# Patient Record
Sex: Male | Born: 1975 | Hispanic: Yes | Marital: Single | Smoking: Former smoker
Health system: Southern US, Community
[De-identification: ages and names within clinical notes are randomized; demographics above are authoritative.]

## PROBLEM LIST (undated history)

## (undated) DIAGNOSIS — R519 Headache, unspecified: Secondary | ICD-10-CM

## (undated) DIAGNOSIS — K275 Chronic or unspecified peptic ulcer, site unspecified, with perforation: Secondary | ICD-10-CM

## (undated) DIAGNOSIS — R51 Headache: Secondary | ICD-10-CM

## (undated) DIAGNOSIS — I10 Essential (primary) hypertension: Secondary | ICD-10-CM

## (undated) DIAGNOSIS — I8500 Esophageal varices without bleeding: Secondary | ICD-10-CM

## (undated) DIAGNOSIS — K759 Inflammatory liver disease, unspecified: Secondary | ICD-10-CM

## (undated) DIAGNOSIS — K7460 Unspecified cirrhosis of liver: Secondary | ICD-10-CM

## (undated) DIAGNOSIS — F1010 Alcohol abuse, uncomplicated: Secondary | ICD-10-CM

## (undated) DIAGNOSIS — K2980 Duodenitis without bleeding: Secondary | ICD-10-CM

## (undated) HISTORY — PX: REPAIR OF PERFORATED ULCER: SHX6065

---

## 2011-03-27 ENCOUNTER — Inpatient Hospital Stay (HOSPITAL_COMMUNITY): Payer: Medicaid Other

## 2011-03-27 ENCOUNTER — Other Ambulatory Visit (HOSPITAL_COMMUNITY): Payer: Self-pay

## 2011-03-27 ENCOUNTER — Inpatient Hospital Stay (HOSPITAL_COMMUNITY)
Admission: AD | Admit: 2011-03-27 | Discharge: 2011-04-03 | DRG: 871 | Disposition: A | Discharge status: Home Health | Payer: Medicaid Other | Source: Other Acute Inpatient Hospital | Attending: Internal Medicine | Admitting: Internal Medicine

## 2011-03-27 DIAGNOSIS — K838 Other specified diseases of biliary tract: Secondary | ICD-10-CM | POA: Diagnosis present

## 2011-03-27 DIAGNOSIS — N170 Acute kidney failure with tubular necrosis: Secondary | ICD-10-CM

## 2011-03-27 DIAGNOSIS — Z79899 Other long term (current) drug therapy: Secondary | ICD-10-CM

## 2011-03-27 DIAGNOSIS — A419 Sepsis, unspecified organism: Secondary | ICD-10-CM

## 2011-03-27 DIAGNOSIS — E872 Acidosis, unspecified: Secondary | ICD-10-CM | POA: Diagnosis present

## 2011-03-27 DIAGNOSIS — A4151 Sepsis due to Escherichia coli [E. coli]: Principal | ICD-10-CM | POA: Diagnosis present

## 2011-03-27 DIAGNOSIS — E876 Hypokalemia: Secondary | ICD-10-CM | POA: Diagnosis present

## 2011-03-27 DIAGNOSIS — D649 Anemia, unspecified: Secondary | ICD-10-CM | POA: Diagnosis not present

## 2011-03-27 DIAGNOSIS — Z23 Encounter for immunization: Secondary | ICD-10-CM

## 2011-03-27 DIAGNOSIS — N179 Acute kidney failure, unspecified: Secondary | ICD-10-CM | POA: Diagnosis present

## 2011-03-27 DIAGNOSIS — J9819 Other pulmonary collapse: Secondary | ICD-10-CM | POA: Diagnosis present

## 2011-03-27 DIAGNOSIS — N12 Tubulo-interstitial nephritis, not specified as acute or chronic: Secondary | ICD-10-CM | POA: Diagnosis present

## 2011-03-27 DIAGNOSIS — K7030 Alcoholic cirrhosis of liver without ascites: Secondary | ICD-10-CM

## 2011-03-27 DIAGNOSIS — I10 Essential (primary) hypertension: Secondary | ICD-10-CM | POA: Diagnosis present

## 2011-03-27 DIAGNOSIS — J189 Pneumonia, unspecified organism: Secondary | ICD-10-CM | POA: Diagnosis present

## 2011-03-27 DIAGNOSIS — D689 Coagulation defect, unspecified: Secondary | ICD-10-CM | POA: Diagnosis present

## 2011-03-27 DIAGNOSIS — R161 Splenomegaly, not elsewhere classified: Secondary | ICD-10-CM | POA: Diagnosis present

## 2011-03-27 DIAGNOSIS — F1010 Alcohol abuse, uncomplicated: Secondary | ICD-10-CM | POA: Diagnosis present

## 2011-03-27 LAB — URINALYSIS, ROUTINE W REFLEX MICROSCOPIC
Nitrite: POSITIVE — AB
Protein, ur: 30 mg/dL — AB
Specific Gravity, Urine: 1.011 (ref 1.005–1.030)
Urobilinogen, UA: 1 mg/dL (ref 0.0–1.0)

## 2011-03-27 LAB — BASIC METABOLIC PANEL
CO2: 18 mEq/L — ABNORMAL LOW (ref 19–32)
Chloride: 94 mEq/L — ABNORMAL LOW (ref 96–112)
Sodium: 129 mEq/L — ABNORMAL LOW (ref 135–145)

## 2011-03-27 LAB — CARDIAC PANEL(CRET KIN+CKTOT+MB+TROPI): Total CK: 21 U/L (ref 7–232)

## 2011-03-27 LAB — CBC
Hemoglobin: 13 g/dL (ref 13.0–17.0)
MCV: 86.3 fL (ref 78.0–100.0)
Platelets: 120 10*3/uL — ABNORMAL LOW (ref 150–400)
RBC: 4.16 MIL/uL — ABNORMAL LOW (ref 4.22–5.81)
WBC: 20.2 10*3/uL — ABNORMAL HIGH (ref 4.0–10.5)

## 2011-03-27 LAB — HEPATIC FUNCTION PANEL
AST: 70 U/L — ABNORMAL HIGH (ref 0–37)
Albumin: 2.4 g/dL — ABNORMAL LOW (ref 3.5–5.2)
Alkaline Phosphatase: 183 U/L — ABNORMAL HIGH (ref 39–117)
Total Protein: 7.4 g/dL (ref 6.0–8.3)

## 2011-03-27 LAB — LIPASE, BLOOD: Lipase: 60 U/L — ABNORMAL HIGH (ref 11–59)

## 2011-03-27 LAB — GLUCOSE, CAPILLARY: Glucose-Capillary: 95 mg/dL (ref 70–99)

## 2011-03-27 LAB — AMYLASE: Amylase: 55 U/L (ref 0–105)

## 2011-03-27 LAB — MRSA PCR SCREENING: MRSA by PCR: NEGATIVE

## 2011-03-27 LAB — PROTIME-INR: Prothrombin Time: 19.8 seconds — ABNORMAL HIGH (ref 11.6–15.2)

## 2011-03-27 LAB — MAGNESIUM: Magnesium: 3.1 mg/dL — ABNORMAL HIGH (ref 1.5–2.5)

## 2011-03-27 LAB — LACTATE DEHYDROGENASE: LDH: 187 U/L (ref 94–250)

## 2011-03-27 LAB — PHOSPHORUS: Phosphorus: 5.3 mg/dL — ABNORMAL HIGH (ref 2.3–4.6)

## 2011-03-27 LAB — STREP PNEUMONIAE URINARY ANTIGEN: Strep Pneumo Urinary Antigen: NEGATIVE

## 2011-03-27 LAB — URINE MICROSCOPIC-ADD ON

## 2011-03-28 ENCOUNTER — Inpatient Hospital Stay (HOSPITAL_COMMUNITY): Payer: Medicaid Other

## 2011-03-28 LAB — LACTIC ACID, PLASMA: Lactic Acid, Venous: 0.9 mmol/L (ref 0.5–2.2)

## 2011-03-28 LAB — CBC
HCT: 32.9 % — ABNORMAL LOW (ref 39.0–52.0)
Hemoglobin: 12.1 g/dL — ABNORMAL LOW (ref 13.0–17.0)
MCH: 31.8 pg (ref 26.0–34.0)
MCV: 86.4 fL (ref 78.0–100.0)
RBC: 3.81 MIL/uL — ABNORMAL LOW (ref 4.22–5.81)

## 2011-03-28 LAB — ANTISTREPTOLYSIN O TITER: ASO: 39 IU/mL (ref 0–408)

## 2011-03-28 LAB — COMPREHENSIVE METABOLIC PANEL
ALT: 39 U/L (ref 0–53)
CO2: 19 mEq/L (ref 19–32)
Calcium: 9 mg/dL (ref 8.4–10.5)
Creatinine, Ser: 6.87 mg/dL — ABNORMAL HIGH (ref 0.50–1.35)
GFR calc Af Amer: 11 mL/min — ABNORMAL LOW (ref >60)
GFR calc non Af Amer: 9 mL/min — ABNORMAL LOW (ref >60)
Glucose, Bld: 108 mg/dL — ABNORMAL HIGH (ref 70–99)
Sodium: 129 mEq/L — ABNORMAL LOW (ref 135–145)

## 2011-03-29 ENCOUNTER — Inpatient Hospital Stay (HOSPITAL_COMMUNITY): Payer: Medicaid Other

## 2011-03-29 LAB — URINE CULTURE
Culture  Setup Time: 201209201800
Special Requests: NEGATIVE

## 2011-03-29 LAB — CBC
MCHC: 36.5 g/dL — ABNORMAL HIGH (ref 30.0–36.0)
MCV: 84.7 fL (ref 78.0–100.0)
Platelets: 136 10*3/uL — ABNORMAL LOW (ref 150–400)
RDW: 14.4 % (ref 11.5–15.5)
WBC: 14 10*3/uL — ABNORMAL HIGH (ref 4.0–10.5)

## 2011-03-29 LAB — COMPREHENSIVE METABOLIC PANEL
AST: 60 U/L — ABNORMAL HIGH (ref 0–37)
Albumin: 1.8 g/dL — ABNORMAL LOW (ref 3.5–5.2)
Chloride: 92 mEq/L — ABNORMAL LOW (ref 96–112)
Creatinine, Ser: 6.61 mg/dL — ABNORMAL HIGH (ref 0.50–1.35)
Total Bilirubin: 8.7 mg/dL — ABNORMAL HIGH (ref 0.3–1.2)
Total Protein: 6.3 g/dL (ref 6.0–8.3)

## 2011-03-29 LAB — MAGNESIUM: Magnesium: 2.7 mg/dL — ABNORMAL HIGH (ref 1.5–2.5)

## 2011-03-29 LAB — PHOSPHORUS: Phosphorus: 6 mg/dL — ABNORMAL HIGH (ref 2.3–4.6)

## 2011-03-30 LAB — PROTIME-INR: INR: 1.55 — ABNORMAL HIGH (ref 0.00–1.49)

## 2011-03-30 LAB — COMPREHENSIVE METABOLIC PANEL
ALT: 32 U/L (ref 0–53)
AST: 61 U/L — ABNORMAL HIGH (ref 0–37)
Albumin: 1.6 g/dL — ABNORMAL LOW (ref 3.5–5.2)
CO2: 29 mEq/L (ref 19–32)
Chloride: 90 mEq/L — ABNORMAL LOW (ref 96–112)
Creatinine, Ser: 6.12 mg/dL — ABNORMAL HIGH (ref 0.50–1.35)
GFR calc non Af Amer: 11 mL/min — ABNORMAL LOW (ref >60)
Sodium: 133 mEq/L — ABNORMAL LOW (ref 135–145)
Total Bilirubin: 8.5 mg/dL — ABNORMAL HIGH (ref 0.3–1.2)

## 2011-03-30 LAB — CBC
HCT: 30.5 % — ABNORMAL LOW (ref 39.0–52.0)
Hemoglobin: 11.3 g/dL — ABNORMAL LOW (ref 13.0–17.0)
MCV: 84.7 fL (ref 78.0–100.0)
RBC: 3.6 MIL/uL — ABNORMAL LOW (ref 4.22–5.81)
WBC: 12.2 10*3/uL — ABNORMAL HIGH (ref 4.0–10.5)

## 2011-03-30 LAB — PHOSPHORUS: Phosphorus: 5.9 mg/dL — ABNORMAL HIGH (ref 2.3–4.6)

## 2011-03-30 LAB — CULTURE, BLOOD (ROUTINE X 2): Culture  Setup Time: 201209210032

## 2011-03-30 LAB — HIV ANTIBODY (ROUTINE TESTING W REFLEX): HIV: NONREACTIVE

## 2011-03-30 LAB — APTT: aPTT: 48 seconds — ABNORMAL HIGH (ref 24–37)

## 2011-03-31 LAB — COMPREHENSIVE METABOLIC PANEL
AST: 57 U/L — ABNORMAL HIGH (ref 0–37)
Albumin: 1.8 g/dL — ABNORMAL LOW (ref 3.5–5.2)
Calcium: 9.1 mg/dL (ref 8.4–10.5)
Creatinine, Ser: 4.91 mg/dL — ABNORMAL HIGH (ref 0.50–1.35)
GFR calc non Af Amer: 14 mL/min — ABNORMAL LOW (ref >60)

## 2011-03-31 LAB — MAGNESIUM: Magnesium: 2.4 mg/dL (ref 1.5–2.5)

## 2011-03-31 LAB — CBC
MCH: 30.3 pg (ref 26.0–34.0)
MCHC: 35.5 g/dL (ref 30.0–36.0)
MCV: 85.4 fL (ref 78.0–100.0)
Platelets: 167 10*3/uL (ref 150–400)
RDW: 14.4 % (ref 11.5–15.5)

## 2011-03-31 LAB — DIFFERENTIAL
Eosinophils Absolute: 0.1 10*3/uL (ref 0.0–0.7)
Eosinophils Relative: 1 % (ref 0–5)
Lymphs Abs: 1 10*3/uL (ref 0.7–4.0)
Monocytes Absolute: 1.2 10*3/uL — ABNORMAL HIGH (ref 0.1–1.0)
Monocytes Relative: 11 % (ref 3–12)

## 2011-03-31 LAB — HEPATITIS PANEL, ACUTE
HCV Ab: NEGATIVE
Hep A IgM: NEGATIVE
Hep B C IgM: NEGATIVE

## 2011-03-31 LAB — PHOSPHORUS: Phosphorus: 5.8 mg/dL — ABNORMAL HIGH (ref 2.3–4.6)

## 2011-04-01 LAB — BASIC METABOLIC PANEL
CO2: 26 mEq/L (ref 19–32)
Calcium: 9.2 mg/dL (ref 8.4–10.5)
Chloride: 97 mEq/L (ref 96–112)
GFR calc Af Amer: 24 mL/min — ABNORMAL LOW (ref >60)
Sodium: 132 mEq/L — ABNORMAL LOW (ref 135–145)

## 2011-04-01 LAB — HEPATITIS B DNA, ULTRAQUANTITATIVE, PCR

## 2011-04-02 LAB — CBC
Hemoglobin: 11.2 g/dL — ABNORMAL LOW (ref 13.0–17.0)
MCH: 30.9 pg (ref 26.0–34.0)
MCHC: 35.1 g/dL (ref 30.0–36.0)
MCV: 88.1 fL (ref 78.0–100.0)

## 2011-04-02 LAB — COMPREHENSIVE METABOLIC PANEL
ALT: 23 U/L (ref 0–53)
BUN: 49 mg/dL — ABNORMAL HIGH (ref 6–23)
CO2: 22 mEq/L (ref 19–32)
Calcium: 8.9 mg/dL (ref 8.4–10.5)
Creatinine, Ser: 2.6 mg/dL — ABNORMAL HIGH (ref 0.50–1.35)
GFR calc Af Amer: 34 mL/min — ABNORMAL LOW (ref >60)
GFR calc non Af Amer: 28 mL/min — ABNORMAL LOW (ref >60)
Glucose, Bld: 120 mg/dL — ABNORMAL HIGH (ref 70–99)
Sodium: 131 mEq/L — ABNORMAL LOW (ref 135–145)
Total Protein: 7.2 g/dL (ref 6.0–8.3)

## 2011-04-02 LAB — AMMONIA: Ammonia: 35 umol/L (ref 11–60)

## 2011-04-03 ENCOUNTER — Inpatient Hospital Stay (HOSPITAL_COMMUNITY): Payer: Medicaid Other

## 2011-04-03 LAB — DIFFERENTIAL
Basophils Absolute: 0.1 10*3/uL (ref 0.0–0.1)
Basophils Relative: 1 % (ref 0–1)
Lymphocytes Relative: 12 % (ref 12–46)
Neutro Abs: 8.3 10*3/uL — ABNORMAL HIGH (ref 1.7–7.7)
Neutrophils Relative %: 77 % (ref 43–77)

## 2011-04-03 LAB — COMPREHENSIVE METABOLIC PANEL
ALT: 25 U/L (ref 0–53)
AST: 81 U/L — ABNORMAL HIGH (ref 0–37)
Albumin: 1.9 g/dL — ABNORMAL LOW (ref 3.5–5.2)
CO2: 21 mEq/L (ref 19–32)
Chloride: 103 mEq/L (ref 96–112)
Creatinine, Ser: 1.73 mg/dL — ABNORMAL HIGH (ref 0.50–1.35)
GFR calc non Af Amer: 45 mL/min — ABNORMAL LOW (ref >60)
Sodium: 132 mEq/L — ABNORMAL LOW (ref 135–145)
Total Bilirubin: 8.4 mg/dL — ABNORMAL HIGH (ref 0.3–1.2)

## 2011-04-03 LAB — CBC
HCT: 30 % — ABNORMAL LOW (ref 39.0–52.0)
Hemoglobin: 10.3 g/dL — ABNORMAL LOW (ref 13.0–17.0)
MCHC: 34.3 g/dL (ref 30.0–36.0)
RBC: 3.41 MIL/uL — ABNORMAL LOW (ref 4.22–5.81)
WBC: 10.7 10*3/uL — ABNORMAL HIGH (ref 4.0–10.5)

## 2011-04-03 LAB — PROTIME-INR
INR: 1.19 (ref 0.00–1.49)
Prothrombin Time: 15.4 seconds — ABNORMAL HIGH (ref 11.6–15.2)

## 2011-04-03 LAB — CULTURE, BLOOD (ROUTINE X 2)
Culture  Setup Time: 201209210033
Culture: NO GROWTH

## 2011-04-09 LAB — IRON AND TIBC: Saturation Ratios: 11 % — ABNORMAL LOW (ref 20–55)

## 2011-04-14 NOTE — Consult Note (Signed)
Michael Ayers, DOUSE NO.:  [id number]  MEDICAL RECORD NO.:  [id number]  LOCATION:  2111                         FACILITY:  [facility]  PHYSICIAN:  [name], M.D.DATE OF BIRTH:  October 21, 1975  DATE OF CONSULTATION:  03/27/2011 DATE OF DISCHARGE:                                CONSULTATION   CONSULTING PHYSICIAN:  Felipa Evener, MD  REASON FOR CONSULTATION:  Acute renal failure.  HISTORY OF PRESENT ILLNESS:  [name] is a 35 year old [place] male with a past medical history significant for what sounds like alcoholic hepatitis versus cirrhosis, who went to a liver clinic in [facility], had blood drawn 2 days ago was contacted and told to go to the emergency room today.  While he was at the emergency department, labs were drawn and were significant for a sodium of 132, bicarb of 20, BUN of 113, and creatinine of 7.41.  He also had an AST of 85 and ALT of 55 and alk phos 259 and a total bilirubin of 10.4.  His urinalysis showed too numerous to count white blood cells, positive nitrites, positive leukocyte esterase.  CT scan was performed of his kidneys which revealed stranding of bilateral kidneys, right greater than left as well as bilateral lower lobe pulmonary opacities.  The kidney changes were nonspecific consistent with either acute renal failure due to intrinsic renal disease versus bilateral pyelonephritis.  He also had some small free pelvic fluid and a psoriatic  liver.  We were asked to further evaluate and manage his electrolyte abnormalities.  ALLERGIES:  No known drug allergies.  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  OUTPATIENT MEDICATIONS.: 1. Folic acid 1 mg a day. 2. Multivitamin 1 a day. 3. Zofran 4 mg p.r.n. 4. On Cipro 250 mg b.i.d., started 2 days ago at the liver clinic  FAMILY HISTORY:  Mother and father alive and well, living in [place].  No history of kidney disease.  SOCIAL HISTORY:  Lives  with a woman, they have three children.  He is not married.  He works at a [employer] in [place] in [place].  He enters information in a computer, does not handle any chemicals, admits to drinking 3-4 beers a day but has stopped 20 days ago, presumably due to his girlfriend regarding his liver.  REVIEW OF SYSTEMS:  GENERAL:  He reports some fevers and malaise about a week ago.  HEENT:  No tinnitus, dysphagia, or odynophagia.  CARDIAC:  No chest pain, palpitations, orthopnea, or PND.  PULMONARY:  No shortness of breath, hemoptysis, productive of cough.  GI:  No nausea, vomiting, hematochezia, melena, or bright red blood per rectum.  HEENT:  Has noticed yellowing of his eyes for last week and some right upper quadrant pain and occasional flank pain.  GU:  He did notice some dysuria and cloudy urine for the last week with the flank pain as above. No hematuria.  NEUROLOGIC:  No arthralgias or myalgias.  DERMATOLOGY: No rashes, lumps, or bumps.  HEMATOLOGY:  No abnormal bleeding or bruising.  All other systems negative.  PHYSICAL EXAMINATION:  GENERAL:  This is a well-developed, well- nourished man with icterus, in no  apparent distress. VITAL SIGNS:  Temperature is 97.8, pulse 82, blood pressure 116/65, and respiratory rate is 19. HEENT:  Head is normocephalic and atraumatic.  Extraocular muscles intact.  Positive icterus.  Oropharynx without lesions. NECK:  Supple.  No lymphadenopathy or bruits. LUNGS:  Clear to auscultation and percussion bilaterally.  No rales or rhonchi. CARDIAC:  Regular rate and rhythm.  No precordial rub appreciated. ABDOMINAL:  Normoactive bowel sounds, soft, mild tender in right upper quadrant.  No guarding or rebound. EXTREMITIES:  No clubbing, cyanosis, or edema. NEUROLOGIC:  Grossly intact.  LABORATORY DATA:  Here are pending, in the outside hospital as per HPI, T-bilirubin 10.4, creatinine is 7.41.  CT scan findings as above.  ASSESSMENT AND  PLAN: 1. Acute renal failure.  This is most likely acute given the fact that     his hemoglobin was 13.5 and possibly related to pyelonephritis     obstruction, also in differential would be acute     glomerulonephritis.  We will check serologies, complement levels,     and ASO titer.  We will follow his urine output and renal function.     There is no indication for dialysis at this time.  We will continue     to follow closely. 2. Metabolic gap acidosis.  We will change his IV fluids to D5W with 3     ampules of bicarb and follow his bicarb level. 3. Hypokalemia.  We will replete. 4. Cirrhosis/hepatitis most likely alcoholic, we will need GI     evaluation. 5. Alcohol abuse, will need counseling. 6. Pyelonephritis, agree with antibiotic choice of Rocephin and     quinolone could be added as he was on Cipro as an outpatient. 7. Questionable pneumonia versus atelectasis.  Per critical care, he     is on Zithromax.  We will also need to check AFBs of urine to rule     out TB.  We will continue to follow.          ______________________________ [name], M.D.     JC/MEDQ  D:  03/27/2011  T:  03/28/2011  Job:  [id number]  Electronically Signed by [name] M.D. on 04/14/2011 01:43:13 PM

## 2011-05-03 NOTE — Discharge Summary (Signed)
[name], [name]:  [id number]  MEDICAL RECORD NO.:  [id number]  LOCATION:  5524                         FACILITY:  [facility]  PHYSICIAN:  Richarda Overlie, MD       DATE OF BIRTH:  October 24, 1975  DATE OF ADMISSION:  03/27/2011 DATE OF DISCHARGE:  04/03/2011                              DISCHARGE SUMMARY   PRIMARY CARE PHYSICIAN:  Michael Mohair, PA for Michael Lively, MD at [facility].  DISCHARGE DIAGNOSES: 1. Sepsis secondary to Escherichia coli extended-spectrum beta-     lactamase bacteremia and urinary tract infection. 2. Extended-spectrum beta-lactamase bacteremia. 3. Escherichia coli extended-spectrum beta-lactamase urinary tract     infection with pyelonephritis. 4. Acute kidney injury with acute renal failure. 5. Alcoholic liver disease with cirrhosis. 6. Hypertension.  CONSULTATIONS: 1. Michael F. Caryn Section, MD, with [facility] in Renal     Service. 2. The patient was seen by Michael Evener, MD in Critical Care.  HISTORY AND BRIEF HOSPITAL COURSE:  [name] is a 35 year old [place] male with past medical history significant for alcoholic hepatitis.  He went to the Liver Clinic in [place] 2 days prior to admission and had blood drawn.  He was contacted and told to go to the emergency room immediately.  In the emergency department, he was found to have elevated LFTs with a bilirubin of 10.4 and an elevated BUN and creatinine with a creatinine of 7.41.  Urinalysis showed white cells too numerous to count, positive nitrites, positive leukocyte esterase.  CT was performed of his kidneys which revealed stranding bilaterally right greater than left and bilateral lower lobe pulmonary opacities.  He was admitted to the [facility] for further evaluation and workup.  The patient was seen in consultation by the Renal Service on March 27, 2011, specifically Dr. Terrial Ayers, he felt that the  patient's renal failure was acute given that his hemoglobin was 13.5 and possibly related to pyelonephritis, obstruction, or possibly acute glomerulonephritis.  He addressed the patient's metabolic gap acidosis with IV fluids, specifically D5W with 3 ampules of bicarb.  He repleted the patient's hypokalemia and agreed with Rocephin as an initial choice of antibiotics for pyelonephritis.  [name] followed the patient throughout his hospitalization.  1. Metabolic gap acidosis.  Over the course of 24-36 hours, the     patient's metabolic gap was closed on the bicarb drip and we were     able to return his fluids to normal saline.  He was moved out of     the step-down unit on to the regular floor.  Blood cultures and     urine cultures were drawn in the emergency department and confirmed     extended-spectrum beta-lactamase Escherichia coli bacteremia as     well as extended-spectrum beta-lactamase Escherichia coli in his     urine.  His AFB smear is still preliminary at this point, but no     acid fast bacilli is seen thus far.  The patient was placed on IV     antibiotics.  His Cipro was changed to [name].  He is on [name]     for  approximately 3 days and then his antibiotics were changed once     again to ertapenem for simplicity of dosing.  Over the course of     his hospital stay, his BUN and creatinine have continued to slowly     come down without the need for dialysis and today, April 03, 2011, his BUN is 41, creatinine 1.73.  It will be important for his     outpatient facility physician to follow his kidney function until     it completely normalized. 2. Alcoholic liver disease with cirrhosis.  As mentioned, the     patient's LFTs were elevated in the emergency department,     specifically he had a total bilirubin of 10.0 with direct bilirubin     7.5, indirect bilirubin 2.5, alkaline phosphatase 183, AST 70, ALT     43.  His PT/INR demonstrated  coagulopathy with PT of 19.8, INR     1.65.  Acute hepatitis panel was drawn during this hospitalization     and found to be negative.  HIV was drawn and found to be     nonreactive.  The patient had an ultrasound of his abdomen on     March 27, 2011.  Findings were strongly suggestive of     cirrhosis.  No evidence of focal hepatic lesion sonographically.     Further, the patient had splenomegaly and gallbladder wall     thickening with gallbladder sludge.  This was thought to be related     to hepatic dysfunction and there was no strong clinical correlation     for acute cholecystitis and his abdominal ultrasound was described     as right kidney is normal in size and parenchymal echogenicity.  No     evidence of solid mass or hydronephrosis.  Left kidney similar and     it was normal in size and parenchymal echogenicity.  No evidence of     solid mass or hydronephrosis.  His liver is described as liver     parenchyma is heterogeneous without focal masses, irregularity of     hepatic contour, likely represents cirrhosis.  CBD was 4.6 mm at     its greatest diameter and there was no extrahepatic biliary     dilatation or intrahepatic biliary dilatation.  Over the course of     his hospitalization, his LFTs did improve somewhat.  At the time of     discharge, his total bilirubin is 8.4, alkaline phosphatase 145,     AST 81, ALT 25.  His coagulation studies revealed a PT of 15.4 and     an INR of 1.19.  The patient will be seen in followup for his liver     disease by his PCP, Michael Ayers.  It is thought that the     patient may one day be appropriate for liver transplant, but he has     been advised that in order for this to be possible, he would have     to; a.     Not use any alcohol for over a year.    b.     Have health insurance.  The patient was counseled strongly      not to drink alcohol ever again.  He did receive community      resources from social work to help him  commit to his alcohol      cessation.  The patient does seem to be  committed to stop      drinking.  PHYSICAL EXAMINATION AT THE TIME OF DISCHARGE:  GENERAL:  The patient is alert and oriented, pleasant to speak with.  Eyes are icteric.  Skin is jaundiced. VITAL SIGNS:  Temperature is 98.6, pulse 65, respirations 18, blood pressure 123/77, O2 sats 94%. HEENT:  Head is atraumatic, normocephalic.  Eyes are anicteric.  Pupils are equal, round.  Nose shows no nasal discharge or exterior lesions. Mouth has moist mucous membranes with good dentition. NECK:  Supple with midline trachea.  No JVD.  No lymphadenopathy. CHEST:  Demonstrates no accessory muscle use.  He has no wheezes or crackles to my exam. HEART:  Regular rate and rhythm without obvious murmurs, rubs, or gallops. ABDOMEN:  Soft, nontender.  He does have hepatomegaly.  He has good bowel sounds. EXTREMITIES:  No clubbing, cyanosis, or edema.  He has a PICC access in his right arm. PSYCHIATRIC:  The patient is alert and oriented.  His demeanor is pleasant, cooperative.  His grooming is good.  Pertinent labs were discussed in history and hospital course.  Pertinent radiological exams were discussed in history and hospital course.  DISCHARGE MEDICATIONS: 1. [name] 1 g IV q.24 hours, this will be given intravenously     through his PICC line at approximately 6 p.m. every evening.  His     last dose will be on April 16, 2011. 2. Metoprolol 50 mg 1 tablet by mouth twice daily for high blood     pressure. 3. Thiamine 100 mg 1 tablet daily by mouth. 4. Folic acid 1 mg 1 tablet daily by mouth. 5. Multivitamin 1 tablet daily. 6. Zofran 4 mg 1 tablet p.r.n. if needed for nausea q.4 hours.  DISCHARGE INSTRUCTIONS: 1. The patient is being discharged home in the care of his significant     other.  Activity will be as tolerated.  He is not to return to work     until after his IV antibiotics is finished on April 17, 2011.      Diet will be a low-salt diet.  The patient did receive nutrition     counseling here in the hospital for appropriate diet for liver     disease. 2. The patient has been counseled strongly that he is absolutely not     to drink any alcohol or beer. 3. With regards to his PICC access, he is to keep it dry.  Home Health     RN will monitor his PICC access to ensure that it works and is in     good condition.  FOLLOWUP APPOINTMENTS:  He is to see the PA, Michael Ayers, and her attending physician, Michael Ayers, at the [facility] on April 08, 2011, at 12 noon.  The patient will be sent home with [facility] RN to educate the family about giving IV antibiotics and monitor his PICC access.  The patient will need ongoing monitoring of his liver function as well as his kidney function and to ensure that his ESBL Escherichia coli bacteremia and urinary tract infection have resolved.     Stephani Police, PA   ______________________________ Richarda Overlie, MD    MLY/MEDQ  D:  04/03/2011  T:  04/04/2011  Job:  [id number]  cc:   Harold Barban, PA Nolon Bussing. Sherrie Mustache, MD  Electronically Signed by Algis Downs PA on 04/09/2011 05:13:36 PM Electronically Signed by Richarda Overlie MD on 05/03/2011 07:30:59 AM

## 2011-05-11 LAB — AFB CULTURE WITH SMEAR (NOT AT ARMC)

## 2016-08-30 DIAGNOSIS — I8500 Esophageal varices without bleeding: Secondary | ICD-10-CM

## 2016-08-30 DIAGNOSIS — F1010 Alcohol abuse, uncomplicated: Secondary | ICD-10-CM

## 2016-08-30 DIAGNOSIS — Z8711 Personal history of peptic ulcer disease: Secondary | ICD-10-CM

## 2016-08-30 DIAGNOSIS — K922 Gastrointestinal hemorrhage, unspecified: Secondary | ICD-10-CM

## 2016-08-30 DIAGNOSIS — K920 Hematemesis: Secondary | ICD-10-CM

## 2018-01-14 ENCOUNTER — Encounter (HOSPITAL_COMMUNITY): Payer: Self-pay | Admitting: Internal Medicine

## 2018-01-14 ENCOUNTER — Inpatient Hospital Stay (HOSPITAL_COMMUNITY)
Admission: AD | Admit: 2018-01-14 | Discharge: 2018-01-16 | DRG: 432 | Disposition: A | Discharge status: Home / Self Care | Payer: Self-pay | Attending: Family Medicine | Admitting: Family Medicine

## 2018-01-14 DIAGNOSIS — I864 Gastric varices: Secondary | ICD-10-CM | POA: Diagnosis present

## 2018-01-14 DIAGNOSIS — D696 Thrombocytopenia, unspecified: Secondary | ICD-10-CM | POA: Diagnosis present

## 2018-01-14 DIAGNOSIS — K7460 Unspecified cirrhosis of liver: Secondary | ICD-10-CM

## 2018-01-14 DIAGNOSIS — K068 Other specified disorders of gingiva and edentulous alveolar ridge: Secondary | ICD-10-CM | POA: Diagnosis present

## 2018-01-14 DIAGNOSIS — I8511 Secondary esophageal varices with bleeding: Secondary | ICD-10-CM | POA: Diagnosis present

## 2018-01-14 DIAGNOSIS — K2950 Unspecified chronic gastritis without bleeding: Secondary | ICD-10-CM | POA: Diagnosis present

## 2018-01-14 DIAGNOSIS — K3189 Other diseases of stomach and duodenum: Secondary | ICD-10-CM | POA: Diagnosis present

## 2018-01-14 DIAGNOSIS — K766 Portal hypertension: Secondary | ICD-10-CM | POA: Diagnosis present

## 2018-01-14 DIAGNOSIS — Z8711 Personal history of peptic ulcer disease: Secondary | ICD-10-CM

## 2018-01-14 DIAGNOSIS — D6959 Other secondary thrombocytopenia: Secondary | ICD-10-CM | POA: Diagnosis present

## 2018-01-14 DIAGNOSIS — K7030 Alcoholic cirrhosis of liver without ascites: Principal | ICD-10-CM | POA: Diagnosis present

## 2018-01-14 DIAGNOSIS — I8500 Esophageal varices without bleeding: Secondary | ICD-10-CM | POA: Diagnosis present

## 2018-01-14 DIAGNOSIS — K2980 Duodenitis without bleeding: Secondary | ICD-10-CM | POA: Diagnosis present

## 2018-01-14 DIAGNOSIS — K920 Hematemesis: Secondary | ICD-10-CM | POA: Diagnosis present

## 2018-01-14 DIAGNOSIS — R945 Abnormal results of liver function studies: Secondary | ICD-10-CM | POA: Diagnosis present

## 2018-01-14 DIAGNOSIS — I10 Essential (primary) hypertension: Secondary | ICD-10-CM | POA: Diagnosis present

## 2018-01-14 DIAGNOSIS — D62 Acute posthemorrhagic anemia: Secondary | ICD-10-CM | POA: Diagnosis present

## 2018-01-14 DIAGNOSIS — R7989 Other specified abnormal findings of blood chemistry: Secondary | ICD-10-CM | POA: Diagnosis present

## 2018-01-14 DIAGNOSIS — F1010 Alcohol abuse, uncomplicated: Secondary | ICD-10-CM | POA: Diagnosis present

## 2018-01-14 DIAGNOSIS — D61818 Other pancytopenia: Secondary | ICD-10-CM | POA: Diagnosis present

## 2018-01-14 HISTORY — DX: Chronic or unspecified peptic ulcer, site unspecified, with perforation: K27.5

## 2018-01-14 HISTORY — DX: Esophageal varices without bleeding: I85.00

## 2018-01-14 HISTORY — DX: Alcohol abuse, uncomplicated: F10.10

## 2018-01-14 HISTORY — DX: Essential (primary) hypertension: I10

## 2018-01-14 HISTORY — DX: Duodenitis without bleeding: K29.80

## 2018-01-14 MED ORDER — ONDANSETRON HCL 4 MG/2ML IJ SOLN
4.0000 mg | Freq: Four times a day (QID) | INTRAMUSCULAR | Status: DC | PRN
  Administered 2018-01-15: 4 mg via INTRAVENOUS
  Filled 2018-01-14: qty 2

## 2018-01-14 MED ORDER — LORAZEPAM 1 MG PO TABS
1.0000 mg | ORAL_TABLET | Freq: Four times a day (QID) | ORAL | Status: DC | PRN
  Administered 2018-01-15: 1 mg via ORAL
  Filled 2018-01-14: qty 1

## 2018-01-14 MED ORDER — MORPHINE SULFATE (PF) 2 MG/ML IV SOLN
2.0000 mg | INTRAVENOUS | Status: DC | PRN
Start: 2018-01-14 — End: 2018-01-16

## 2018-01-14 MED ORDER — SODIUM CHLORIDE 0.9 % IV SOLN
INTRAVENOUS | Status: DC
  Administered 2018-01-15 (×2): via INTRAVENOUS

## 2018-01-14 MED ORDER — ADULT MULTIVITAMIN W/MINERALS CH
1.0000 | ORAL_TABLET | Freq: Every day | ORAL | Status: DC
  Administered 2018-01-15 – 2018-01-16 (×2): 1 via ORAL
  Filled 2018-01-14 (×2): qty 1

## 2018-01-14 MED ORDER — SODIUM CHLORIDE 0.9 % IV SOLN
8.0000 mg/h | INTRAVENOUS | Status: DC
  Administered 2018-01-15 (×3): 8 mg/h via INTRAVENOUS
  Filled 2018-01-14 (×6): qty 80

## 2018-01-14 MED ORDER — THIAMINE HCL 100 MG/ML IJ SOLN: 100.0000 mg | Freq: Every day | INTRAMUSCULAR | Status: DC

## 2018-01-14 MED ORDER — LORAZEPAM 2 MG/ML IJ SOLN: 1.0000 mg | Freq: Four times a day (QID) | INTRAMUSCULAR | Status: DC | PRN

## 2018-01-14 MED ORDER — ONDANSETRON HCL 4 MG PO TABS: 4.0000 mg | ORAL_TABLET | Freq: Four times a day (QID) | ORAL | Status: DC | PRN

## 2018-01-14 MED ORDER — PANTOPRAZOLE SODIUM 40 MG IV SOLR: 40.0000 mg | Freq: Two times a day (BID) | INTRAVENOUS | Status: DC

## 2018-01-14 MED ORDER — FOLIC ACID 1 MG PO TABS
1.0000 mg | ORAL_TABLET | Freq: Every day | ORAL | Status: DC
  Administered 2018-01-15 – 2018-01-16 (×2): 1 mg via ORAL
  Filled 2018-01-14 (×2): qty 1

## 2018-01-14 MED ORDER — SODIUM CHLORIDE 0.9 % IV SOLN
25.0000 ug/h | INTRAVENOUS | Status: DC
  Administered 2018-01-15 (×2): 25 ug/h via INTRAVENOUS
  Filled 2018-01-14 (×6): qty 1

## 2018-01-14 MED ORDER — VITAMIN B-1 100 MG PO TABS
100.0000 mg | ORAL_TABLET | Freq: Every day | ORAL | Status: DC
  Administered 2018-01-15 – 2018-01-16 (×2): 100 mg via ORAL
  Filled 2018-01-14 (×2): qty 1

## 2018-01-14 MED ORDER — LORAZEPAM 2 MG/ML IJ SOLN: 0.0000 mg | Freq: Two times a day (BID) | INTRAMUSCULAR | Status: DC

## 2018-01-14 MED ORDER — OCTREOTIDE LOAD VIA INFUSION
50.0000 ug | Freq: Once | INTRAVENOUS | Status: DC
  Filled 2018-01-14: qty 25

## 2018-01-14 MED ORDER — LORAZEPAM 2 MG/ML IJ SOLN
0.0000 mg | Freq: Four times a day (QID) | INTRAMUSCULAR | Status: DC
  Administered 2018-01-15 (×2): 2 mg via INTRAVENOUS
  Filled 2018-01-14 (×2): qty 1

## 2018-01-14 MED ORDER — HYDRALAZINE HCL 20 MG/ML IJ SOLN: 5.0000 mg | INTRAMUSCULAR | Status: DC | PRN

## 2018-01-14 MED ORDER — ZOLPIDEM TARTRATE 5 MG PO TABS: 5.0000 mg | ORAL_TABLET | Freq: Every evening | ORAL | Status: DC | PRN

## 2018-01-14 MED ORDER — AMLODIPINE BESYLATE 5 MG PO TABS
5.0000 mg | ORAL_TABLET | Freq: Every day | ORAL | Status: DC
  Administered 2018-01-15: 5 mg via ORAL
  Filled 2018-01-14: qty 1

## 2018-01-14 NOTE — H&P (Addendum)
History and Physical    Michael Ayers EAV:[id number] DOB: 05-28-1976 DOA: 01/14/2018  Referring MD/NP/PA:   PCP: System, Provider Not In   Patient coming from:  The patient is coming from home.  At baseline, pt is independent for most of ADL.  Chief Complaint: Hematemesis  HPI: Michael Ayers is a 42 y.o. male with medical history significant of hypertension, alcohol abuse, esophageal varices, duodenitis, bleeding ulcer, who presents with hematemesis.  Pt states that he has had 3 episodes of hematemesis with brown-red-colored blood today.  No black stool. He has mild abdominal pain, which is located in the central abdomen, mild, sharp, nonradiating.  Denies dizziness, lightheadedness.  No chest pain, shortness breath, cough.  No fever or chills.  No symptoms of UTI.  [facility], and found to have hemoglobin dropped from 14.2 to 13.6. Patient was started with Protonix and octreotide drip. Pt continues to drink alcohol, last drinking was in AM.  Pt was found to have platelets 65, creatinine 1.0, abnormal liver function with AST 180, ALT 52, total bilirubin 2.7, ALP 166, no fever, heart rate 80s, no tachypnea, oxygen saturation 95% on room air, blood pressure 152/89. Pt is admitted to tele bed as inpt. ED physician consulted GI, Dr. Levora AngelBrahmbhatt.  Review of Systems:   General: no fevers, chills, no body weight gain, no fatigue HEENT: no blurry vision, hearing changes or sore throat Respiratory: no dyspnea, coughing, wheezing CV: no chest pain, no palpitations GI: has nausea, vomiting, abdominal pain and hematemesis . No diarrhea, constipation GU: no dysuria, burning on urination, increased urinary frequency, hematuria  Ext: no leg edema Neuro: no unilateral weakness, numbness, or tingling, no vision change or hearing loss Skin: no rash, no skin tear. MSK: No muscle spasm, no deformity, no limitation of range of movement in spin Heme: No easy bruising.    Travel history: No recent long distant travel.  Allergy: Allergies not on file  Past Medical History:  Diagnosis Date  . Alcohol abuse   . Duodenitis   . Esophageal varices (HCC)   . Essential hypertension   . Perforated peptic ulcer (HCC)     Past Surgical History:  Procedure Laterality Date  . Perforated peptic ulcer      Social History:  has no tobacco, alcohol, and drug history on file.  Family History:  Family History  Problem Relation Age of Onset  . Diabetes Mellitus II Mother      Prior to Admission medications   Not on File    Physical Exam: There were no vitals filed for this visit. General: Not in acute distress HEENT:       Eyes: PERRL, EOMI, no scleral icterus.       ENT: No discharge from the ears and nose, no pharynx injection, no tonsillar enlargement.        Neck: No JVD, no bruit, no mass felt. Heme: No neck lymph node enlargement. Cardiac: S1/S2, RRR, No murmurs, No gallops or rubs. Respiratory: No rales, wheezing, rhonchi or rubs. GI: Soft, nondistended, nontender, no rebound pain, no organomegaly, BS present. GU: No hematuria Ext: No pitting leg edema bilaterally. 2+DP/PT pulse bilaterally. Musculoskeletal: No joint deformities, No joint redness or warmth, no limitation of ROM in spin. Skin: No rashes.  Neuro: Alert, oriented X3, cranial nerves II-XII grossly intact, moves all extremities normally.  Psych: Patient is not psychotic, no suicidal or hemocidal ideation.  Labs on Admission: I have personally reviewed following labs and imaging studies  CBC: Recent Labs  Lab 01/14/18 2348 01/15/18 0354  WBC 2.2* 2.0*  HGB 12.2* 12.2*  HCT 38.7* 38.2*  MCV 97.2 98.2  PLT 78* 61*   Basic Metabolic Panel: Recent Labs  Lab 01/15/18 0354  NA 141  K 4.7  CL 104  CO2 18*  GLUCOSE 61*  BUN 11  CREATININE 1.04  CALCIUM 8.8*   GFR: CrCl cannot be calculated (Unknown ideal weight.). Liver Function Tests: Recent Labs  Lab  01/15/18 0354  AST 119*  ALT 44  ALKPHOS 108  BILITOT 2.8*  PROT 7.7  ALBUMIN 3.6   No results for input(s): LIPASE, AMYLASE in the last 168 hours. Recent Labs  Lab 01/14/18 2348  AMMONIA 102*   Coagulation Profile: Recent Labs  Lab 01/14/18 2348  INR 1.24   Cardiac Enzymes: No results for input(s): CKTOTAL, CKMB, CKMBINDEX, TROPONINI in the last 168 hours. BNP (last 3 results) No results for input(s): PROBNP in the last 8760 hours. HbA1C: No results for input(s): HGBA1C in the last 72 hours. CBG: No results for input(s): GLUCAP in the last 168 hours. Lipid Profile: No results for input(s): CHOL, HDL, LDLCALC, TRIG, CHOLHDL, LDLDIRECT in the last 72 hours. Thyroid Function Tests: No results for input(s): TSH, T4TOTAL, FREET4, T3FREE, THYROIDAB in the last 72 hours. Anemia Panel: No results for input(s): VITAMINB12, FOLATE, FERRITIN, TIBC, IRON, RETICCTPCT in the last 72 hours. Urine analysis:    Component Value Date/Time   COLORURINE YELLOW 03/27/2011 1714   APPEARANCEUR HAZY (A) 03/27/2011 1714   LABSPEC 1.011 03/27/2011 1714   PHURINE 5.0 03/27/2011 1714   GLUCOSEU NEGATIVE 03/27/2011 1714   HGBUR LARGE (A) 03/27/2011 1714   BILIRUBINUR SMALL (A) 03/27/2011 1714   KETONESUR NEGATIVE 03/27/2011 1714   PROTEINUR 30 (A) 03/27/2011 1714   UROBILINOGEN 1.0 03/27/2011 1714   NITRITE POSITIVE (A) 03/27/2011 1714   LEUKOCYTESUR MODERATE (A) 03/27/2011 1714   Sepsis Labs: @LABRCNTIP (procalcitonin:4,lacticidven:4) )No results found for this or any previous visit (from the past 240 hour(s)).   Radiological Exams on Admission: No results found.   EKG:   Not done yet, will get one.   Assessment/Plan Principal Problem:   Hematemesis Active Problems:   Alcohol abuse   Esophageal varices (HCC)   Duodenitis   Essential hypertension   Abnormal LFTs   Thrombocytopenia (HCC)   Hematemesis: likely from UGB. Pt has hx of alcohol abuse, esophageal varices,  duodenitis, bleeding/perforated peptic ulcer which required surgery. Hemoglobin 13.6.  Hemodynamically stable.  GI, Dr. Levora Angel was consulted by EDP. RN will inform GI of pt's arrival.  - will admit to tele bed as inpt - GI consulted by Ed, will follow up recommendations - NPO for possible EGD - IVF: NS at 125 mL/hr - continue IV pantoprazole gtt and octreotide drip - Zofran IV for nausea - Avoid NSAIDs and SQ heparin - Maintain IV access (2 large bore IVs if possible). - Monitor closely and follow q6h cbc, transfuse as necessary, if Hgb<7.0 - LaB: INR, PTT and type screen  Alcohol abuse: -Did counseling about the importance of quitting drinking -CIWA protocol  Hx of Duodenitis: -on protonix gtt  Essential hypertension: Blood pressure 152/89.  Patient states that he used to take one blood pressure medications, but stopped taking it 2 weeks ago.  He does not remember the name of medication. -Start amlodipine 5 mg daily - IV hydralazine as needed  Abnormal LFTs: Likely due to alcohol abuse and possible liver cirrhosis -check  Hepatitis panel and  HIV antibody -Avoid using Tylenol  -Check ammonia level  Addendum: ammonia level 102. Mental status normal. -will start lactulose 20 g bid  Thrombocytopenia: Platelets 65.  Patient may have alcoholic liver cirrhosis - follow-up with CBC.    DVT ppx: SCD Code Status: Full code Family Communication: None at bed side.      Disposition Plan:  Anticipate discharge back to previous home environment Consults called: GI, Dr. Levora Angel Admission status:   Inpatient/tele     Date of Service 01/15/2018    Lorretta Harp [facility] Pager [phone]  If 7PM-7AM, please contact night-coverage [email or web address] Password [email or web address] 01/15/2018, 5:00 AM

## 2018-01-15 ENCOUNTER — Inpatient Hospital Stay (HOSPITAL_COMMUNITY): Payer: Self-pay

## 2018-01-15 ENCOUNTER — Encounter (HOSPITAL_COMMUNITY): Payer: Self-pay | Admitting: Internal Medicine

## 2018-01-15 DIAGNOSIS — R7989 Other specified abnormal findings of blood chemistry: Secondary | ICD-10-CM | POA: Diagnosis present

## 2018-01-15 DIAGNOSIS — R945 Abnormal results of liver function studies: Secondary | ICD-10-CM | POA: Diagnosis present

## 2018-01-15 DIAGNOSIS — D696 Thrombocytopenia, unspecified: Secondary | ICD-10-CM | POA: Diagnosis present

## 2018-01-15 LAB — CBC
HCT: 38.2 % — ABNORMAL LOW (ref 39.0–52.0)
HCT: 36 % — ABNORMAL LOW (ref 39.0–52.0)
HEMATOCRIT: 37.1 % — AB (ref 39.0–52.0)
HEMATOCRIT: 38.7 % — AB (ref 39.0–52.0)
HEMOGLOBIN: 12.2 g/dL — AB (ref 13.0–17.0)
Hemoglobin: 12.2 g/dL — ABNORMAL LOW (ref 13.0–17.0)
Hemoglobin: 11.8 g/dL — ABNORMAL LOW (ref 13.0–17.0)
Hemoglobin: 11.6 g/dL — ABNORMAL LOW (ref 13.0–17.0)
MCH: 30.7 pg (ref 26.0–34.0)
MCH: 31.5 pg (ref 26.0–34.0)
MCH: 30.8 pg (ref 26.0–34.0)
MCH: 31.4 pg (ref 26.0–34.0)
MCHC: 32.2 g/dL (ref 30.0–36.0)
MCHC: 31.5 g/dL (ref 30.0–36.0)
MCHC: 31.8 g/dL (ref 30.0–36.0)
MCHC: 31.9 g/dL (ref 30.0–36.0)
MCV: 95.5 fL (ref 78.0–100.0)
MCV: 97.2 fL (ref 78.0–100.0)
MCV: 98.2 fL (ref 78.0–100.0)
MCV: 98.9 fL (ref 78.0–100.0)
PLATELETS: 62 10*3/uL — AB (ref 150–400)
Platelets: 78 10*3/uL — ABNORMAL LOW (ref 150–400)
Platelets: 61 10*3/uL — ABNORMAL LOW (ref 150–400)
Platelets: 59 10*3/uL — ABNORMAL LOW (ref 150–400)
RBC: 3.98 MIL/uL — ABNORMAL LOW (ref 4.22–5.81)
RBC: 3.89 MIL/uL — ABNORMAL LOW (ref 4.22–5.81)
RBC: 3.75 MIL/uL — ABNORMAL LOW (ref 4.22–5.81)
RBC: 3.77 MIL/uL — ABNORMAL LOW (ref 4.22–5.81)
RDW: 16 % — ABNORMAL HIGH (ref 11.5–15.5)
RDW: 16.1 % — ABNORMAL HIGH (ref 11.5–15.5)
RDW: 15.5 % (ref 11.5–15.5)
RDW: 16.1 % — AB (ref 11.5–15.5)
WBC: 1.8 10*3/uL — AB (ref 4.0–10.5)
WBC: 2 10*3/uL — AB (ref 4.0–10.5)
WBC: 2 10*3/uL — ABNORMAL LOW (ref 4.0–10.5)
WBC: 2.2 10*3/uL — ABNORMAL LOW (ref 4.0–10.5)

## 2018-01-15 LAB — COMPREHENSIVE METABOLIC PANEL
ALT: 44 U/L (ref 0–44)
ANION GAP: 19 — AB (ref 5–15)
AST: 119 U/L — ABNORMAL HIGH (ref 15–41)
Albumin: 3.6 g/dL (ref 3.5–5.0)
Alkaline Phosphatase: 108 U/L (ref 38–126)
BUN: 11 mg/dL (ref 6–20)
CHLORIDE: 104 mmol/L (ref 98–111)
CO2: 18 mmol/L — ABNORMAL LOW (ref 22–32)
CREATININE: 1.04 mg/dL (ref 0.61–1.24)
Calcium: 8.8 mg/dL — ABNORMAL LOW (ref 8.9–10.3)
Glucose, Bld: 61 mg/dL — ABNORMAL LOW (ref 70–99)
Potassium: 4.7 mmol/L (ref 3.5–5.1)
Sodium: 141 mmol/L (ref 135–145)
Total Bilirubin: 2.8 mg/dL — ABNORMAL HIGH (ref 0.3–1.2)
Total Protein: 7.7 g/dL (ref 6.5–8.1)

## 2018-01-15 LAB — PROTIME-INR
INR: 1.24
Prothrombin Time: 15.5 seconds — ABNORMAL HIGH (ref 11.4–15.2)

## 2018-01-15 LAB — ABO/RH: ABO/RH(D): AB POS

## 2018-01-15 LAB — APTT: APTT: 36 s (ref 24–36)

## 2018-01-15 LAB — TYPE AND SCREEN
ABO/RH(D): AB POS
Antibody Screen: NEGATIVE

## 2018-01-15 LAB — HIV ANTIBODY (ROUTINE TESTING W REFLEX): HIV SCREEN 4TH GENERATION: NONREACTIVE

## 2018-01-15 LAB — AMMONIA: Ammonia: 102 umol/L — ABNORMAL HIGH (ref 9–35)

## 2018-01-15 MED ORDER — SODIUM CHLORIDE 0.9 % IV SOLN
1.0000 g | INTRAVENOUS | Status: DC
  Administered 2018-01-15 – 2018-01-16 (×2): 1 g via INTRAVENOUS
  Filled 2018-01-15 (×2): qty 10

## 2018-01-15 MED ORDER — LACTULOSE 10 GM/15ML PO SOLN
20.0000 g | Freq: Two times a day (BID) | ORAL | Status: DC
  Administered 2018-01-15: 20 g via ORAL
  Filled 2018-01-15: qty 30

## 2018-01-15 MED ORDER — IBUPROFEN 400 MG PO TABS
400.0000 mg | ORAL_TABLET | Freq: Once | ORAL | Status: AC
  Administered 2018-01-15: 400 mg via ORAL
  Filled 2018-01-15: qty 1

## 2018-01-15 MED ORDER — SODIUM CHLORIDE 0.9 % IV SOLN
INTRAVENOUS | Status: DC
  Administered 2018-01-15 – 2018-01-16 (×2): via INTRAVENOUS

## 2018-01-15 NOTE — Consult Note (Signed)
Referring Provider:  TH/ [facility] ER Primary Care Physician:  System, Provider Not In Primary Gastroenterologist:  Unassigned, primary GI in [place]  Reason for Consultation:  Upper GI bleed  HPI: Michael Ayers is a 42 y.o. 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  Because of lack of GI coverage, he was transferred to [facility] for further evaluation.  Patient seen and examined at bedside. able to provide appropriate history with limited English.he had few episodes of vomiting after heavy alcohol drink yesterday.he saw small amount of fresh blood initially  followed by brown colored vomiting.denied any black tarry stool. No bowel movement since yesterday. Complaining of lower abdominal discomfort. Denied bright red blood per rectum.  H/O  heavy NSAID use in the past but has not used NSAIDs in several months.  Past Medical History:  Diagnosis Date  . Alcohol abuse   . Duodenitis   . Esophageal varices (HCC)   . Essential hypertension   . Perforated peptic ulcer (HCC)     Past Surgical History:  Procedure Laterality Date  . Perforated peptic ulcer      Prior to Admission medications   Not on File    Scheduled Meds: . amLODipine  5 mg Oral Daily  . folic acid  1 mg Oral Daily  . lactulose  20 g Oral BID  . LORazepam  0-4 mg Intravenous Q6H   Followed by  . [START ON 01/16/2018] LORazepam  0-4 mg Intravenous Q12H  . multivitamin with minerals  1 tablet Oral Daily  . octreotide  50 mcg Intravenous Once  . [START ON 01/18/2018] pantoprazole  40 mg Intravenous Q12H  . thiamine  100 mg Oral Daily   Or  . thiamine  100 mg Intravenous Daily   Continuous Infusions: . sodium chloride 125 mL/hr at 01/15/18 0813  . octreotide  (SANDOSTATIN)    IV infusion  25 mcg/hr (01/15/18 0813)  . pantoprozole (PROTONIX) infusion 8 mg/hr (01/15/18 0813)   PRN Meds:.hydrALAZINE, LORazepam **OR** LORazepam, morphine injection, ondansetron **OR** ondansetron (ZOFRAN) IV, zolpidem  Allergies as of 01/14/2018  . (Not on File)    Family History  Problem Relation Age of Onset  . Diabetes Mellitus II Mother     Social History   Socioeconomic History  . Marital status: Single    Spouse name: Not on file  . Number of children: Not on file  . Years of education: Not on file  . Highest education level: Not on file  Occupational History  . Not on file  Social Needs  . Financial resource strain: Not on file  . Food insecurity:    Worry: Not on file    Inability: Not on file  . Transportation needs:    Medical: Not on file    Non-medical: Not on file  Tobacco Use  . Smoking status: Not on file  Substance and Sexual Activity  . Alcohol use: Not on file  . Drug use: Not on file  . Sexual activity: Not on file  Lifestyle  . Physical activity:    Days per week: Not on file    Minutes per session: Not on file  . Stress: Not on file  Relationships  . Social connections:    Talks on phone: Not on file    Gets together: Not on file  Attends religious service: Not on file    Active member of club or organization: Not on file    Attends meetings of clubs or organizations: Not on file    Relationship status: Not on file  . Intimate partner violence:    Fear of current or ex partner: Not on file    Emotionally abused: Not on file    Physically abused: Not on file    Forced sexual activity: Not on file  Other Topics Concern  . Not on file  Social History Narrative  . Not on file    Review of Systems: Review of Systems  Constitutional: Negative for chills and fever.  HENT: Negative for hearing loss and tinnitus.   Eyes: Negative for blurred vision and double vision.  Respiratory: Positive for sputum production. Negative for cough and  hemoptysis.   Cardiovascular: Negative for chest pain and palpitations.  Gastrointestinal: Positive for abdominal pain, nausea and vomiting. Negative for blood in stool, constipation, diarrhea, heartburn and melena.  Genitourinary: Negative for dysuria and urgency.  Musculoskeletal: Positive for back pain and myalgias.  Skin: Negative for itching and rash.  Neurological: Negative for seizures and loss of consciousness.  Endo/Heme/Allergies: Bruises/bleeds easily.  Psychiatric/Behavioral: Negative for hallucinations and suicidal ideas.    Physical Exam: Vital signs: Vitals:   01/15/18 0520  BP: 134/86  Pulse: 71  Resp: 18  Temp: 97.7 F (36.5 C)  SpO2: 98%   Last BM Date: 01/15/18 Physical Exam  Constitutional: He is oriented to person, place, and time. He appears well-developed and well-nourished.  HENT:  Head: Atraumatic.  Mouth/Throat: No oropharyngeal exudate.  Evidence of oozing of blood from the gums.  Eyes: EOM are normal. No scleral icterus.  Neck: Normal range of motion. Neck supple.  Cardiovascular: Normal rate, regular rhythm and normal heart sounds.  Pulmonary/Chest: Effort normal and breath sounds normal. No respiratory distress.  Abdominal: Soft. Bowel sounds are normal. He exhibits no distension. There is no tenderness. There is no rebound and no guarding.  Musculoskeletal: Normal range of motion. He exhibits no edema.  Neurological: He is alert and oriented to person, place, and time.  Skin: Skin is warm. No erythema.  Psychiatric: He has a normal mood and affect. Judgment normal.  Vitals reviewed.   GI:  Lab Results: Recent Labs    01/14/18 2348 01/15/18 0354  WBC 2.2* 2.0*  HGB 12.2* 12.2*  HCT 38.7* 38.2*  PLT 78* 61*   BMET Recent Labs    01/15/18 0354  NA 141  K 4.7  CL 104  CO2 18*  GLUCOSE 61*  BUN 11  CREATININE 1.04  CALCIUM 8.8*   LFT Recent Labs    01/15/18 0354  PROT 7.7  ALBUMIN 3.6  AST 119*  ALT 44  ALKPHOS 108   BILITOT 2.8*   PT/INR Recent Labs    01/14/18 2348  LABPROT 15.5*  INR 1.24     Studies/Results: No results found.  Impression/Plan: - upper GI bleed with vomiting of small amount of blood after heavy alcohol drink yesterday. Hemoglobin relatively stable. He was found to have bleeding from gums on oral exam. Denied melena.BUNs normal. 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 Last EGD was in February 2018 per [facility]. MELD score 13  - History of bleeding ulcer requiring Graham patch in the past. - Ongoing heavy alcohol use  Recommendations --------------------------- - No evidence of active bleeding at this time. Given his  history, it is reasonable to proceed with EGD tomorrow. - okay to have full liquid diet today - Nothing by mouth past midnight - continue octreotide, Protonix. Add Rocephin given history of cirrhosis. - ultrasound liver. - GI will follow    LOS: 1 day   Kathi Der  MD, FACP 01/15/2018, 8:42 AM  Contact #  [phone]

## 2018-01-15 NOTE — Progress Notes (Signed)
PROGRESS NOTE  Michael Ayers  WGN:[id number] DOB: [birth year] DOA: 01/14/2018 PCP: System, Provider Not In   Brief Narrative: Michael Ayers is a 42 y.o. male with a history of alcohol abuse, cirrhosis, esophageal varics, bleeding ulcer who was [facility] due to hematemesis. He had been drinking 5 24oz beers and began having red blood in emesis followed by dark blood. His hemoglobin had decreased mildly and emesis has stopped. PPI and octreotide were started, GI consulted and pt admitted with plans for EGD 7/13.   Assessment & Plan: Principal Problem:   Hematemesis Active Problems:   Alcohol abuse   Esophageal varices (HCC)   Duodenitis   Essential hypertension   Abnormal LFTs   Thrombocytopenia (HCC)  Hematemesis: I pt with history of esophageal varices and bleeding ulcer.  - Liquids today, NPO p MN per GI for EGD in AM - Continue PPI and octreotide gtt's - No ascites on exam, but started on prophylactic ceftriaxone per GI - IV antiemetics prn  Acute blood loss anemia: Mild.  - Serial CBCs, has been type and screened.   Alcoholic hepatic cirrhosis: Liver nodularity noted even in 2012 and ongoing alcohol use. Noted leukopenia and thrombocytopenia consistent with cirrhosis. AST > ALT elevation. - Liver U/S per GI - Ammonia elevated but does not appear to be encephalopathic. Started lactulose, but will stop for now  Alcohol abuse: Daily >2 beers per day, precontemplative for cessation. Denies hx withdrawal. - CIWA - MVM's  HTN: Per report, no longer taking prescribed medication.  - Monitor BP. Does seem to have room for beta blocker if indicated.   DVT prophylaxis: SCDs Code Status: Full Family Communication: None at bedside Disposition Plan: Home when work up complete and no further bleeding.  Consultants:   GI  Procedures:   EGD 01/16/2018  Antimicrobials:  Ceftriaxone 7/12 >>    Subjective: Feels fine, no further nausea or vomiting or  bleeding. No shaking or hx withdrawal.   Objective: Vitals:   01/15/18 0520 01/15/18 1421  BP: 134/86 140/68  Pulse: 71 63  Resp: 18 18  Temp: 97.7 F (36.5 C) 98.7 F (37.1 C)  TempSrc:  Oral  SpO2: 98% 98%    Intake/Output Summary (Last 24 hours) at 01/15/2018 1912 Last data filed at 01/15/2018 1421 Gross per 24 hour  Intake 1151.93 ml  Output 650 ml  Net 501.93 ml   There were no vitals filed for this visit.  Gen: 42 y.o. male in no distress  Pulm: Non-labored breathing. Clear to auscultation bilaterally.  CV: Regular rate and rhythm. No murmur, rub, or gallop. No JVD, no pedal edema. GI: Abdomen soft, non-tender, non-distended, with normoactive bowel sounds. No organomegaly or masses felt. Ext: Warm, no deformities Skin: No rashes, lesions or ulcers Neuro: Alert and oriented. No focal neurological deficits. Psych: Judgement and insight appear normal. Mood & affect appropriate.   Data Reviewed: I have personally reviewed following labs and imaging studies  CBC: Recent Labs  Lab 01/14/18 2348 01/15/18 0354 01/15/18 1057 01/15/18 1648  WBC 2.2* 2.0* 2.0* 1.8*  HGB 12.2* 12.2* 11.8* 11.6*  HCT 38.7* 38.2* 37.1* 36.0*  MCV 97.2 98.2 98.9 95.5  PLT 78* 61* 62* 59*   Basic Metabolic Panel: Recent Labs  Lab 01/15/18 0354  NA 141  K 4.7  CL 104  CO2 18*  GLUCOSE 61*  BUN 11  CREATININE 1.04  CALCIUM 8.8*   GFR: CrCl cannot be calculated (Unknown ideal weight.). Liver Function Tests: Recent Labs  Lab 01/15/18 0354  AST 119*  ALT 44  ALKPHOS 108  BILITOT 2.8*  PROT 7.7  ALBUMIN 3.6   No results for input(s): LIPASE, AMYLASE in the last 168 hours. Recent Labs  Lab 01/14/18 2348  AMMONIA 102*   Coagulation Profile: Recent Labs  Lab 01/14/18 2348  INR 1.24   Cardiac Enzymes: No results for input(s): CKTOTAL, CKMB, CKMBINDEX, TROPONINI in the last 168 hours. BNP (last 3 results) No results for input(s): PROBNP in the last 8760  hours. HbA1C: No results for input(s): HGBA1C in the last 72 hours. CBG: No results for input(s): GLUCAP in the last 168 hours. Lipid Profile: No results for input(s): CHOL, HDL, LDLCALC, TRIG, CHOLHDL, LDLDIRECT in the last 72 hours. Thyroid Function Tests: No results for input(s): TSH, T4TOTAL, FREET4, T3FREE, THYROIDAB in the last 72 hours. Anemia Panel: No results for input(s): VITAMINB12, FOLATE, FERRITIN, TIBC, IRON, RETICCTPCT in the last 72 hours. Urine analysis:    Component Value Date/Time   COLORURINE YELLOW 03/27/2011 1714   APPEARANCEUR HAZY (A) 03/27/2011 1714   LABSPEC 1.011 03/27/2011 1714   PHURINE 5.0 03/27/2011 1714   GLUCOSEU NEGATIVE 03/27/2011 1714   HGBUR LARGE (A) 03/27/2011 1714   BILIRUBINUR SMALL (A) 03/27/2011 1714   KETONESUR NEGATIVE 03/27/2011 1714   PROTEINUR 30 (A) 03/27/2011 1714   UROBILINOGEN 1.0 03/27/2011 1714   NITRITE POSITIVE (A) 03/27/2011 1714   LEUKOCYTESUR MODERATE (A) 03/27/2011 1714   No results found for this or any previous visit (from the past 240 hour(s)).    Radiology Studies: No results found.  Scheduled Meds: . amLODipine  5 mg Oral Daily  . folic acid  1 mg Oral Daily  . lactulose  20 g Oral BID  . LORazepam  0-4 mg Intravenous Q6H   Followed by  . [START ON 01/17/2018] LORazepam  0-4 mg Intravenous Q12H  . multivitamin with minerals  1 tablet Oral Daily  . octreotide  50 mcg Intravenous Once  . [START ON 01/18/2018] pantoprazole  40 mg Intravenous Q12H  . thiamine  100 mg Oral Daily   Or  . thiamine  100 mg Intravenous Daily   Continuous Infusions: . sodium chloride 125 mL/hr at 01/15/18 0813  . cefTRIAXone (ROCEPHIN)  IV 1 g (01/15/18 1244)  . octreotide  (SANDOSTATIN)    IV infusion 25 mcg/hr (01/15/18 0813)  . pantoprozole (PROTONIX) infusion 8 mg/hr (01/15/18 1239)     LOS: 1 day   Time spent: 25 minutes.  Tyrone Nine, MD [facility] [email or web address] Password [facility] 01/15/2018, 7:12 PM

## 2018-01-16 ENCOUNTER — Inpatient Hospital Stay (HOSPITAL_COMMUNITY): Payer: Self-pay | Admitting: Certified Registered Nurse Anesthetist

## 2018-01-16 ENCOUNTER — Encounter (HOSPITAL_COMMUNITY): Payer: Self-pay | Admitting: *Deleted

## 2018-01-16 ENCOUNTER — Encounter (HOSPITAL_COMMUNITY)
Admission: AD | Disposition: A | Discharge status: Home / Self Care | Payer: Self-pay | Source: Home / Self Care | Attending: Family Medicine

## 2018-01-16 DIAGNOSIS — I10 Essential (primary) hypertension: Secondary | ICD-10-CM

## 2018-01-16 DIAGNOSIS — K920 Hematemesis: Secondary | ICD-10-CM

## 2018-01-16 DIAGNOSIS — D696 Thrombocytopenia, unspecified: Secondary | ICD-10-CM

## 2018-01-16 DIAGNOSIS — R945 Abnormal results of liver function studies: Secondary | ICD-10-CM

## 2018-01-16 DIAGNOSIS — I8501 Esophageal varices with bleeding: Secondary | ICD-10-CM

## 2018-01-16 DIAGNOSIS — K2980 Duodenitis without bleeding: Secondary | ICD-10-CM

## 2018-01-16 DIAGNOSIS — F1010 Alcohol abuse, uncomplicated: Secondary | ICD-10-CM

## 2018-01-16 HISTORY — PX: ESOPHAGOGASTRODUODENOSCOPY (EGD) WITH PROPOFOL: SHX5813

## 2018-01-16 LAB — HEPATITIS PANEL, ACUTE
HCV Ab: <0.1 s/co ratio (ref 0.0–0.9)
Hep A IgM: NEGATIVE
Hep B C IgM: NEGATIVE
Hepatitis B Surface Ag: NEGATIVE

## 2018-01-16 LAB — CBC
HCT: 35.9 % — ABNORMAL LOW (ref 39.0–52.0)
Hemoglobin: 11.6 g/dL — ABNORMAL LOW (ref 13.0–17.0)
MCH: 30.9 pg (ref 26.0–34.0)
MCHC: 32.3 g/dL (ref 30.0–36.0)
MCV: 95.5 fL (ref 78.0–100.0)
PLATELETS: 59 10*3/uL — AB (ref 150–400)
RBC: 3.76 MIL/uL — ABNORMAL LOW (ref 4.22–5.81)
RDW: 15 % (ref 11.5–15.5)
WBC: 1.4 10*3/uL — CL (ref 4.0–10.5)

## 2018-01-16 LAB — HEPATIC FUNCTION PANEL
ALBUMIN: 3 g/dL — AB (ref 3.5–5.0)
ALK PHOS: 97 U/L (ref 38–126)
ALT: 36 U/L (ref 0–44)
AST: 85 U/L — AB (ref 15–41)
BILIRUBIN DIRECT: 1.1 mg/dL — AB (ref 0.0–0.2)
BILIRUBIN TOTAL: 3.4 mg/dL — AB (ref 0.3–1.2)
Indirect Bilirubin: 2.3 mg/dL — ABNORMAL HIGH (ref 0.3–0.9)
TOTAL PROTEIN: 7 g/dL (ref 6.5–8.1)

## 2018-01-16 SURGERY — ESOPHAGOGASTRODUODENOSCOPY (EGD) WITH PROPOFOL
Anesthesia: Monitor Anesthesia Care

## 2018-01-16 MED ORDER — SODIUM CHLORIDE 0.9 % IV SOLN
INTRAVENOUS | Status: DC
  Administered 2018-01-16: 08:00:00 via INTRAVENOUS

## 2018-01-16 MED ORDER — NADOLOL 20 MG PO TABS
20.0000 mg | ORAL_TABLET | Freq: Every day | ORAL | Status: DC
  Administered 2018-01-16: 20 mg via ORAL
  Filled 2018-01-16: qty 1

## 2018-01-16 MED ORDER — NADOLOL 20 MG PO TABS: 20.0000 mg | ORAL_TABLET | Freq: Every day | ORAL | Status: DC

## 2018-01-16 MED ORDER — PROPOFOL 10 MG/ML IV BOLUS
INTRAVENOUS | Status: DC | PRN
  Administered 2018-01-16 (×5): 20 mg via INTRAVENOUS

## 2018-01-16 MED ORDER — PANTOPRAZOLE SODIUM 40 MG PO TBEC: 40.0000 mg | DELAYED_RELEASE_TABLET | Freq: Every day | ORAL | 0 refills | Status: DC

## 2018-01-16 MED ORDER — NADOLOL 20 MG PO TABS: 20.0000 mg | ORAL_TABLET | Freq: Every day | ORAL | 0 refills | Status: DC

## 2018-01-16 MED ORDER — PROPOFOL 500 MG/50ML IV EMUL
INTRAVENOUS | Status: DC | PRN
  Administered 2018-01-16: 100 ug/kg/min via INTRAVENOUS

## 2018-01-16 SURGICAL SUPPLY — 14 items

## 2018-01-16 NOTE — Transfer of Care (Signed)
Immediate Anesthesia Transfer of Care Note  Patient: Michael Ayers  Procedure(s) Performed: ESOPHAGOGASTRODUODENOSCOPY (EGD) WITH PROPOFOL (N/A )  Patient Location: Endoscopy Unit  Anesthesia Type:MAC  Level of Consciousness: awake, alert  and oriented  Airway & Oxygen Therapy: Patient Spontanous Breathing and Patient connected to nasal cannula oxygen  Post-op Assessment: Report given to RN and Post -op Vital signs reviewed and stable  Post vital signs: Reviewed and stable  Last Vitals:  Vitals Value Taken Time  BP 151/93 01/16/2018  8:07 AM  Temp 36.9 C 01/16/2018  8:07 AM  Pulse 75 01/16/2018  8:07 AM  Resp 19 01/16/2018  8:07 AM  SpO2 98 % 01/16/2018  8:07 AM    Last Pain:  Vitals:   01/16/18 0807  TempSrc: Oral  PainSc: 0-No pain      Patients Stated Pain Goal: 0 (72/90/21 1155)  Complications: No apparent anesthesia complications

## 2018-01-16 NOTE — Anesthesia Preprocedure Evaluation (Addendum)
Anesthesia Evaluation  Patient identified by MRN, date of birth, ID band Patient awake    Reviewed: Allergy & Precautions, NPO status , Patient's Chart, lab work & pertinent test results  Airway Mallampati: II  TM Distance: >3 FB Neck ROM: Full    Dental no notable dental hx.    Pulmonary neg pulmonary ROS,    Pulmonary exam normal breath sounds clear to auscultation       Cardiovascular hypertension, Normal cardiovascular exam Rhythm:Regular Rate:Normal  ECG: SR, occ PVC's, rate 68   Neuro/Psych negative neurological ROS  negative psych ROS   GI/Hepatic PUD, (+) Cirrhosis     substance abuse  , Esophageal varices   Endo/Other  negative endocrine ROS  Renal/GU negative Renal ROS     Musculoskeletal negative musculoskeletal ROS (+)   Abdominal   Peds  Hematology  (+) anemia , Thrombocytopenia   Anesthesia Other Findings Upper GI bleed  Reproductive/Obstetrics                            Anesthesia Physical Anesthesia Plan  ASA: IV  Anesthesia Plan: MAC   Post-op Pain Management:    Induction: Intravenous  PONV Risk Score and Plan: 1 and Propofol infusion and Treatment may vary due to age or medical condition  Airway Management Planned: Nasal Cannula  Additional Equipment:   Intra-op Plan:   Post-operative Plan:   Informed Consent: I have reviewed the patients History and Physical, chart, labs and discussed the procedure including the risks, benefits and alternatives for the proposed anesthesia with the patient or authorized representative who has indicated his/her understanding and acceptance.   Dental advisory given  Plan Discussed with: CRNA  Anesthesia Plan Comments:        Anesthesia Quick Evaluation

## 2018-01-16 NOTE — Discharge Summary (Addendum)
Physician Discharge Summary  Princ[place] Nabor ZOX:[id number] DOB: 08/29/1975 DOA: 01/14/2018  PCP: System, Provider Not In  Admit date: 01/14/2018 Discharge date: 01/16/2018  Admitted From: Home Disposition: Home   Recommendations for Outpatient Follow-up:  1. Follow up with PCP in 1-2 weeks. Establish care visit scheduled 8/14. 2. Continue alcohol cessation efforts 3. Follow up with GI for further workup and management of cirrhosis including liver U/S.  4. Follow up HR, BP; started low dose beta blocker.  Home Health: None Equipment/Devices: None Discharge Condition: Stable CODE STATUS: Full Diet recommendation: As tolerated  Brief/Interim Summary: Michael Ayers is a 42 y.o. male with a history of alcohol abuse, cirrhosis, esophageal varics, bleeding ulcer who was admitted from [facility] due to hematemesis. He had been drinking 5 24oz beers and began having red blood in emesis followed by dark blood. His hemoglobin had decreased mildly and emesis has stopped. PPI and octreotide were started, GI consulted and performed EGD 7/13 which showed grade I esophageal varices, portal hypertensive gastropathy, and chronic gastritis. His symptoms have remained resolved since admission and he has tolerated a soft diet.   Discharge Diagnoses:  Principal Problem:   Hematemesis Active Problems:   Alcohol abuse   Esophageal varices (HCC)   Duodenitis   Essential hypertension   Abnormal LFTs   Thrombocytopenia (HCC)  Hematemesis due to grade I esophageal varices, portal hypertensive gastropathy, chronic gastritis. Hematemesis has resolved. - Advance diet per GI recommendations.  - Wean off octreotide and convert PPI to PO - Will start nadolol but low dose given resting HR in 50-60's.  - Counseled to avoid EtOH and NSAIDs  Acute blood loss anemia: Mild.  - Serial CBCs showed stability, hgb at discharge 11.6 with no further bleeding episodes  Alcoholic hepatic cirrhosis: Liver  nodularity noted even in 2012 and ongoing alcohol use. Noted leukopenia and thrombocytopenia consistent with cirrhosis. AST > ALT elevation. - Liver U/S as outpatient per GI - Follow up with GI  Alcohol abuse: Daily >2 beers per day, precontemplative for cessation. Denies hx withdrawal and did not display any evidence of withdrawal during admission.   HTN: Per report, no longer taking prescribed medication.  - Started nadolol as above. Follow up with PCP. Not in severe range.   Discharge Instructions Discharge Instructions    Diet - low sodium heart healthy   Complete by:  As directed    Discharge instructions   Complete by:  As directed    You were admitted for vomiting blood which was due to inflammation in the stomach and abnormal blood vessels (esophageal varices) in the stomach and esophagus. These findings are due to liver cirrhosis which is due to excessive alcohol intake. You should stop drinking alcohol completely. You should also not take any NSAID medications for pain (including ibuprofen, aleve, advil, naproxen, goody's, BC powder, etc.) as these can increase risk of bleeding. You should also avoid tylenol due to liver cirrhosis.   - To prevent bleeding of the varices, take nadolol once daily and protonix once daily. Printed prescriptions provided and may be taken to the [facility] to be filled. You have a follow up appointment there scheduled on August 14th which is very important.   If your symptoms return, seek medical attention right away.   Increase activity slowly   Complete by:  As directed      Allergies as of 01/16/2018   Not on File     Medication List    TAKE these  medications   nadolol 20 MG tablet Commonly known as:  CORGARD Take 1 tablet (20 mg total) by mouth daily.   pantoprazole 40 MG tablet Commonly known as:  PROTONIX Take 1 tablet (40 mg total) by mouth daily.      Follow-up Information    [facility]. Go on 02/17/2018.   Why:  3:50 pm Contact information: [address]  [place] [id number] [phone]       Kathi DerBrahmbhatt, Parag, MD. Schedule an appointment as soon as possible for a visit in 1 month(s).   Specialty:  Gastroenterology Contact information: [address] [place] [address] [phone]          Not on File  Consultations:  [facility]  Procedures/Studies:  EGD 01/16/2018 by Dr. Ewing SchleinMagod: Impression:       - Normal larynx.                           - Grade I esophageal varices.                           - Portal hypertensive gastropathy.                           - Chronic gastritis.                           - Normal ampulla, duodenal bulb, first portion of                            the duodenum, second portion of the duodenum, major                            papilla and area of the papilla.                           - Type 1 gastroesophageal varices (GOV1, esophageal                            varices which extend along the lesser curvature),                            without bleeding.                           - The examination was otherwise normal.                           - No specimens collected. Recommendation: Patient has a contact number available for                            emergencies. The signs and symptoms of potential                            delayed complications were discussed with the  patient. Return to normal activities tomorrow.                            Written discharge instructions were provided to the                            patient.                           - Clear liquid diet today. may slowly advance as                            tolerated                           - Continue present medications. may wean octreotide                            and change to by mouth pump inhibitors and would                            try on beta blockers                            - Return to GI clinic PRN. no alcohol probably                            needs alcohol rehabilitation and no aspirin and                            nonsteroidals and please call us if we can be of                            any further assistance with this hospital stay                           - Telephone GI clinic if symptomatic PRN.    Subjective: Feels well. No nausea, vomiting, dysphagia, odynophagia, abdominal pain or bleeding/bruising. Wants to go home.   Discharge Exam: Vitals:   01/16/18 0830 01/16/18 1320  BP: (!) 146/98 (!) 149/82  Pulse: (!) 54 (!) 59  Resp: 16 20  Temp: 98.2 F (36.8 C) 98.6 F (37 C)  SpO2: 97% 97%   General: Pt is alert, awake, not in acute distress Cardiovascular: RRR, S1/S2 +, no rubs, no gallops Respiratory: CTA bilaterally, no wheezing, no rhonchi Abdominal: Soft, NT, ND, bowel sounds + Extremities: No edema, no cyanosis  Labs: Basic Metabolic Panel: Recent Labs  Lab 01/15/18 0354  NA 141  K 4.7  CL 104  CO2 18*  GLUCOSE 61*  BUN 11  CREATININE 1.04  CALCIUM 8.8*   Liver Function Tests: Recent Labs  Lab 01/15/18 0354 01/16/18 0452  AST 119* 85*  ALT 44 36  ALKPHOS 108 97  BILITOT 2.8* 3.4*  PROT 7.7 7.0  ALBUMIN 3.6 3.0*   CBC: Recent Labs  Lab 01/14/18 2348 01/15/18 0354 01/15/18 1057 01/15/18 1648 01/16/18 0452  WBC 2.2* 2.0* 2.0* 1.8* 1.4*  HGB 12.2* 12.2* 11.8* 11.6* 11.6*  HCT 38.7* 38.2* 37.1* 36.0* 35.9*  MCV 97.2 98.2 98.9 95.5 95.5  PLT 78* 61* 62* 59* 59*    Time coordinating discharge: Approximately 40 minutes  Tyrone Nine, MD  [facility] 01/16/2018, 1:21 PM Pager [phone]

## 2018-01-16 NOTE — Anesthesia Postprocedure Evaluation (Signed)
Anesthesia Post Note  Patient: Deaveon Schoen  Procedure(s) Performed: ESOPHAGOGASTRODUODENOSCOPY (EGD) WITH PROPOFOL (N/A )     Patient location during evaluation: PACU Anesthesia Type: MAC Level of consciousness: awake and alert Pain management: pain level controlled Vital Signs Assessment: post-procedure vital signs reviewed and stable Respiratory status: spontaneous breathing, nonlabored ventilation, respiratory function stable and patient connected to nasal cannula oxygen Cardiovascular status: stable and blood pressure returned to baseline Postop Assessment: no apparent nausea or vomiting Anesthetic complications: no    Last Vitals:  Vitals:   01/16/18 0830 01/16/18 1320  BP: (!) 146/98 (!) 149/82  Pulse: (!) 54 (!) 59  Resp: 16 20  Temp: 36.8 C 37 C  SpO2: 97% 97%    Last Pain:  Vitals:   01/16/18 1320  TempSrc: Oral  PainSc:                  Ryan P Ellender

## 2018-01-16 NOTE — Progress Notes (Signed)
Michael ChurchManuel Ayers to be D/C'd Home per MD order.  Discussed with the patient and all questions fully answered.  VSS, Skin clean, dry and intact without evidence of skin break down, no evidence of skin tears noted. IV catheter discontinued intact. Site without signs and symptoms of complications. Dressing and pressure applied.  An After Visit Summary was printed and given to the patient. Patient received prescription.  D/c education completed with patient/family including follow up instructions, medication list, d/c activities limitations if indicated, with other d/c instructions as indicated by MD - patient able to verbalize understanding, all questions fully answered.   Patient instructed to return to ED, call 911, or call MD for any changes in condition.   Patient escorted via WC, and D/C home via private auto.  Marca AnconaLaura M [name]

## 2018-01-16 NOTE — Progress Notes (Signed)
Michael Ayers 7:46 AM  Subjective: Patient seen and examined and hospital computer chart reviewed and case discussed with my partner Dr. B and he says he has not had any more bleeding and no new complaints  Objective: Vital signs stable afebrile no acute distress exam please see preassessment evaluation labs stable  Assessment: GI bleed in a patient with history of varices and ulcers and alcohol abuse and pancytopenia  Plan: Okay to proceed with endoscopy with anesthesia assistance this morning  [facility],Safari Cinque E  Pager [phone] After 5PM or if no answer call [phone]

## 2018-01-16 NOTE — Op Note (Signed)
[facility] Patient Name: Michael Ayers Procedure Date : 01/16/2018 MRN: [id number] Attending MD: Vida RiggerMarc Kerith Sherley , MD Date of Birth: Apr 09, 1976 CSN: [id number] Age: [age] Admit Type: Inpatient Procedure:                Upper GI endoscopy Indications:              HematemesisPancytopenia history of varices alcohol                            abuse Providers:                Vida RiggerMarc Jasnoor Trussell, MD, Harold BarbanLiz Honeycutt, RN, Madalyn RobMarilyn                            Everhart, Technician Referring MD:              Medicines:                Propofol total dose 200 mg IV Complications:            No immediate complications. Estimated Blood Loss:     Estimated blood loss: none. Procedure:                Pre-Anesthesia Assessment:                           - Prior to the procedure, a History and Physical                            was performed, and patient medications and                            allergies were reviewed. The patient's tolerance of                            previous anesthesia was also reviewed. The risks                            and benefits of the procedure and the sedation                            options and risks were discussed with the patient.                            All questions were answered, and informed consent                            was obtained. Prior Anticoagulants: The patient has                            taken no previous anticoagulant or antiplatelet                            agents. ASA Grade Assessment: II - A patient with  mild systemic disease. After reviewing the risks                            and benefits, the patient was deemed in                            satisfactory condition to undergo the procedure.                           After obtaining informed consent, the endoscope was                            passed under direct vision. Throughout the                            procedure, the patient's blood pressure,  pulse, and                            oxygen saturations were monitored continuously. The                            [id number] ) was introduced through the                            mouth, and advanced to the second part of duodenum.                            The upper GI endoscopy was accomplished without                            difficulty. The patient tolerated the procedure                            well. Scope In: Scope Out: Findings:      The larynx was normal.      Grade I varices were found at the gastroesophageal junction.      Mild portal hypertensive gastropathy was found in the cardia and in the       gastric fundus.      Localized mild inflammation characterized by erythema was found in the       gastric antrum.      The ampulla, duodenal bulb, first portion of the duodenum, second       portion of the duodenum, major papilla and area of the papilla were       normal.      probable type 1gastroesophageal varices (GOV1, esophageal varices which       extend along the lesser curvature) with no bleeding were found in the       gastric fundus. There were no stigmata of recent bleeding. there was       really just size varix      The exam was otherwise without abnormality. Impression:               - Normal larynx.                           -  Grade I esophageal varices.                           - Portal hypertensive gastropathy.                           - Chronic gastritis.                           - Normal ampulla, duodenal bulb, first portion of                            the duodenum, second portion of the duodenum, major                            papilla and area of the papilla.                           - Type 1 gastroesophageal varices (GOV1, esophageal                            varices which extend along the lesser curvature),                            without bleeding.                           - The examination was otherwise normal.                            - No specimens collected. Recommendation:           - Patient has a contact number available for                            emergencies. The signs and symptoms of potential                            delayed complications were discussed with the                            patient. Return to normal activities tomorrow.                            Written discharge instructions were provided to the                            patient.                           - Clear liquid diet today. may slowly advance as                            tolerated                           - Continue present medications. may wean  octreotide                            and change to by mouth pump inhibitors and would                            try on beta blockers                           - Return to GI clinic PRN. no alcohol probably                            needs alcohol rehabilitation and no aspirin and                            nonsteroidals and please call us if we can be of                            any further assistance with this hospital stay                           - Telephone GI clinic if symptomatic PRN. Procedure Code(s):        --- Professional ---                           [phone], Esophagogastroduodenoscopy, flexible,                            transoral; diagnostic, including collection of                            specimen(s) by brushing or washing, when performed                            (separate procedure) Diagnosis Code(s):        --- Professional ---                           I85.00, Esophageal varices without bleeding                           K76.6, Portal hypertension                           K31.89, Other diseases of stomach and duodenum                           K29.50, Unspecified chronic gastritis without                            bleeding                           I86.4, Gastric varices                           K92.0, Hematemesis CPT copyright  2017  American Medical Association. All rights reserved. The codes documented in this report are preliminary and upon coder review may  be revised to meet current compliance requirements. Vida Rigger, MD 01/16/2018 8:14:49 AM This report has been signed electronically. Number of Addenda: 0

## 2018-01-17 ENCOUNTER — Encounter (HOSPITAL_COMMUNITY): Payer: Self-pay | Admitting: Gastroenterology

## 2018-02-17 ENCOUNTER — Inpatient Hospital Stay: Payer: Self-pay | Admitting: Nurse Practitioner

## 2018-03-26 ENCOUNTER — Inpatient Hospital Stay (HOSPITAL_COMMUNITY)
Admission: EM | Admit: 2018-03-26 | Discharge: 2018-03-30 | DRG: 433 | Disposition: A | Discharge status: Home / Self Care | Payer: Self-pay | Attending: Internal Medicine | Admitting: Internal Medicine

## 2018-03-26 ENCOUNTER — Encounter (HOSPITAL_COMMUNITY): Payer: Self-pay | Admitting: Emergency Medicine

## 2018-03-26 DIAGNOSIS — Z8711 Personal history of peptic ulcer disease: Secondary | ICD-10-CM

## 2018-03-26 DIAGNOSIS — F1010 Alcohol abuse, uncomplicated: Secondary | ICD-10-CM | POA: Diagnosis present

## 2018-03-26 DIAGNOSIS — R7989 Other specified abnormal findings of blood chemistry: Secondary | ICD-10-CM | POA: Diagnosis present

## 2018-03-26 DIAGNOSIS — K2920 Alcoholic gastritis without bleeding: Secondary | ICD-10-CM | POA: Diagnosis present

## 2018-03-26 DIAGNOSIS — Z833 Family history of diabetes mellitus: Secondary | ICD-10-CM

## 2018-03-26 DIAGNOSIS — I8500 Esophageal varices without bleeding: Secondary | ICD-10-CM | POA: Diagnosis present

## 2018-03-26 DIAGNOSIS — K766 Portal hypertension: Secondary | ICD-10-CM | POA: Diagnosis present

## 2018-03-26 DIAGNOSIS — R945 Abnormal results of liver function studies: Secondary | ICD-10-CM | POA: Diagnosis present

## 2018-03-26 DIAGNOSIS — K2980 Duodenitis without bleeding: Secondary | ICD-10-CM | POA: Diagnosis present

## 2018-03-26 DIAGNOSIS — K7030 Alcoholic cirrhosis of liver without ascites: Principal | ICD-10-CM | POA: Diagnosis present

## 2018-03-26 DIAGNOSIS — D61818 Other pancytopenia: Secondary | ICD-10-CM | POA: Diagnosis present

## 2018-03-26 DIAGNOSIS — K922 Gastrointestinal hemorrhage, unspecified: Secondary | ICD-10-CM

## 2018-03-26 DIAGNOSIS — I864 Gastric varices: Secondary | ICD-10-CM | POA: Diagnosis present

## 2018-03-26 DIAGNOSIS — R109 Unspecified abdominal pain: Secondary | ICD-10-CM | POA: Diagnosis present

## 2018-03-26 DIAGNOSIS — I8510 Secondary esophageal varices without bleeding: Secondary | ICD-10-CM | POA: Diagnosis present

## 2018-03-26 DIAGNOSIS — K920 Hematemesis: Secondary | ICD-10-CM | POA: Diagnosis present

## 2018-03-26 DIAGNOSIS — D649 Anemia, unspecified: Secondary | ICD-10-CM | POA: Diagnosis present

## 2018-03-26 DIAGNOSIS — I10 Essential (primary) hypertension: Secondary | ICD-10-CM | POA: Diagnosis present

## 2018-03-26 DIAGNOSIS — K7010 Alcoholic hepatitis without ascites: Secondary | ICD-10-CM | POA: Diagnosis present

## 2018-03-26 DIAGNOSIS — D696 Thrombocytopenia, unspecified: Secondary | ICD-10-CM | POA: Diagnosis present

## 2018-03-26 LAB — CBC
HCT: 42.2 % (ref 39.0–52.0)
Hemoglobin: 13.7 g/dL (ref 13.0–17.0)
MCH: 29.8 pg (ref 26.0–34.0)
MCHC: 32.5 g/dL (ref 30.0–36.0)
MCV: 91.9 fL (ref 78.0–100.0)
Platelets: 75 10*3/uL — ABNORMAL LOW (ref 150–400)
RBC: 4.59 MIL/uL (ref 4.22–5.81)
RDW: 18.6 % — ABNORMAL HIGH (ref 11.5–15.5)
WBC: 2 10*3/uL — ABNORMAL LOW (ref 4.0–10.5)

## 2018-03-26 LAB — URINALYSIS, ROUTINE W REFLEX MICROSCOPIC
BACTERIA UA: NONE SEEN
Bilirubin Urine: NEGATIVE
Glucose, UA: NEGATIVE mg/dL
Ketones, ur: NEGATIVE mg/dL
LEUKOCYTES UA: NEGATIVE
Nitrite: NEGATIVE
PROTEIN: 100 mg/dL — AB
SPECIFIC GRAVITY, URINE: 1.015 (ref 1.005–1.030)
pH: 6 (ref 5.0–8.0)

## 2018-03-26 LAB — COMPREHENSIVE METABOLIC PANEL
ALBUMIN: 3.5 g/dL (ref 3.5–5.0)
ALK PHOS: 105 U/L (ref 38–126)
ALT: 55 U/L — ABNORMAL HIGH (ref 0–44)
ANION GAP: 12 (ref 5–15)
AST: 148 U/L — ABNORMAL HIGH (ref 15–41)
BILIRUBIN TOTAL: 1.8 mg/dL — AB (ref 0.3–1.2)
BUN: 7 mg/dL (ref 6–20)
CALCIUM: 8.9 mg/dL (ref 8.9–10.3)
CO2: 23 mmol/L (ref 22–32)
Chloride: 106 mmol/L (ref 98–111)
Creatinine, Ser: 0.72 mg/dL (ref 0.61–1.24)
GLUCOSE: 111 mg/dL — AB (ref 70–99)
Potassium: 3.8 mmol/L (ref 3.5–5.1)
Sodium: 141 mmol/L (ref 135–145)
Total Protein: 8.1 g/dL (ref 6.5–8.1)

## 2018-03-26 LAB — LIPASE, BLOOD: Lipase: 71 U/L — ABNORMAL HIGH (ref 11–51)

## 2018-03-26 LAB — PROTIME-INR
INR: 1.15
Prothrombin Time: 14.6 seconds (ref 11.4–15.2)

## 2018-03-26 LAB — SAMPLE TO BLOOD BANK

## 2018-03-26 NOTE — ED Triage Notes (Signed)
Patient reports multiple bloody emesis onset this morning with generalized abdominal pain , denies diarrhea or fever , history of esophageal varices/PUD .

## 2018-03-27 ENCOUNTER — Encounter (HOSPITAL_COMMUNITY)
Admission: EM | Disposition: A | Discharge status: Home / Self Care | Payer: Self-pay | Source: Home / Self Care | Attending: Internal Medicine

## 2018-03-27 ENCOUNTER — Observation Stay (HOSPITAL_COMMUNITY): Payer: Self-pay | Admitting: Certified Registered"

## 2018-03-27 ENCOUNTER — Observation Stay (HOSPITAL_COMMUNITY): Payer: Self-pay

## 2018-03-27 ENCOUNTER — Encounter (HOSPITAL_COMMUNITY): Payer: Self-pay | Admitting: *Deleted

## 2018-03-27 ENCOUNTER — Other Ambulatory Visit: Payer: Self-pay

## 2018-03-27 DIAGNOSIS — R109 Unspecified abdominal pain: Secondary | ICD-10-CM | POA: Diagnosis present

## 2018-03-27 DIAGNOSIS — I10 Essential (primary) hypertension: Secondary | ICD-10-CM

## 2018-03-27 DIAGNOSIS — R1013 Epigastric pain: Secondary | ICD-10-CM

## 2018-03-27 HISTORY — PX: ESOPHAGOGASTRODUODENOSCOPY (EGD) WITH PROPOFOL: SHX5813

## 2018-03-27 LAB — CBC
HCT: 35.3 % — ABNORMAL LOW (ref 39.0–52.0)
HCT: 36.6 % — ABNORMAL LOW (ref 39.0–52.0)
HCT: 35.9 % — ABNORMAL LOW (ref 39.0–52.0)
HEMATOCRIT: 36.8 % — AB (ref 39.0–52.0)
HEMOGLOBIN: 11.8 g/dL — AB (ref 13.0–17.0)
HEMOGLOBIN: 11.9 g/dL — AB (ref 13.0–17.0)
Hemoglobin: 11.8 g/dL — ABNORMAL LOW (ref 13.0–17.0)
Hemoglobin: 11.5 g/dL — ABNORMAL LOW (ref 13.0–17.0)
MCH: 29.7 pg (ref 26.0–34.0)
MCH: 30 pg (ref 26.0–34.0)
MCH: 29.7 pg (ref 26.0–34.0)
MCH: 30.4 pg (ref 26.0–34.0)
MCHC: 32.1 g/dL (ref 30.0–36.0)
MCHC: 32.6 g/dL (ref 30.0–36.0)
MCHC: 32.2 g/dL (ref 30.0–36.0)
MCHC: 33.1 g/dL (ref 30.0–36.0)
MCV: 92.7 fL (ref 78.0–100.0)
MCV: 92.2 fL (ref 78.0–100.0)
MCV: 92.2 fL (ref 78.0–100.0)
MCV: 91.8 fL (ref 78.0–100.0)
PLATELETS: 58 10*3/uL — AB (ref 150–400)
PLATELETS: 55 10*3/uL — AB (ref 150–400)
Platelets: 58 10*3/uL — ABNORMAL LOW (ref 150–400)
Platelets: 53 10*3/uL — ABNORMAL LOW (ref 150–400)
RBC: 3.97 MIL/uL — ABNORMAL LOW (ref 4.22–5.81)
RBC: 3.83 MIL/uL — ABNORMAL LOW (ref 4.22–5.81)
RBC: 3.97 MIL/uL — ABNORMAL LOW (ref 4.22–5.81)
RBC: 3.91 MIL/uL — AB (ref 4.22–5.81)
RDW: 18.6 % — AB (ref 11.5–15.5)
RDW: 18.6 % — AB (ref 11.5–15.5)
RDW: 18.2 % — AB (ref 11.5–15.5)
RDW: 18 % — ABNORMAL HIGH (ref 11.5–15.5)
WBC: 1.7 10*3/uL — ABNORMAL LOW (ref 4.0–10.5)
WBC: 1.8 10*3/uL — ABNORMAL LOW (ref 4.0–10.5)
WBC: 1.4 10*3/uL — CL (ref 4.0–10.5)
WBC: 1.5 10*3/uL — AB (ref 4.0–10.5)

## 2018-03-27 LAB — COMPREHENSIVE METABOLIC PANEL
ALBUMIN: 3.1 g/dL — AB (ref 3.5–5.0)
ALK PHOS: 85 U/L (ref 38–126)
ALT: 45 U/L — AB (ref 0–44)
ANION GAP: 11 (ref 5–15)
AST: 110 U/L — ABNORMAL HIGH (ref 15–41)
BUN: 9 mg/dL (ref 6–20)
CO2: 23 mmol/L (ref 22–32)
CREATININE: 0.67 mg/dL (ref 0.61–1.24)
Calcium: 8.3 mg/dL — ABNORMAL LOW (ref 8.9–10.3)
Chloride: 105 mmol/L (ref 98–111)
GFR calc Af Amer: >60 mL/min (ref >60)
GFR calc non Af Amer: >60 mL/min (ref >60)
Glucose, Bld: 109 mg/dL — ABNORMAL HIGH (ref 70–99)
Potassium: 4.2 mmol/L (ref 3.5–5.1)
SODIUM: 139 mmol/L (ref 135–145)
TOTAL PROTEIN: 7.3 g/dL (ref 6.5–8.1)
Total Bilirubin: 2.3 mg/dL — ABNORMAL HIGH (ref 0.3–1.2)

## 2018-03-27 LAB — GLUCOSE, CAPILLARY: GLUCOSE-CAPILLARY: 92 mg/dL (ref 70–99)

## 2018-03-27 LAB — AMMONIA: AMMONIA: 92 umol/L — AB (ref 9–35)

## 2018-03-27 LAB — APTT: aPTT: 34 seconds (ref 24–36)

## 2018-03-27 SURGERY — ESOPHAGOGASTRODUODENOSCOPY (EGD) WITH PROPOFOL
Anesthesia: Monitor Anesthesia Care

## 2018-03-27 MED ORDER — IOPAMIDOL (ISOVUE-370) INJECTION 76%
100.0000 mL | Freq: Once | INTRAVENOUS | Status: AC | PRN
  Administered 2018-03-27: 100 mL via INTRAVENOUS

## 2018-03-27 MED ORDER — ONDANSETRON HCL 4 MG PO TABS: 4.0000 mg | ORAL_TABLET | Freq: Four times a day (QID) | ORAL | Status: DC | PRN

## 2018-03-27 MED ORDER — BUTAMBEN-TETRACAINE-BENZOCAINE 2-2-14 % EX AERO
INHALATION_SPRAY | CUTANEOUS | Status: DC | PRN
  Administered 2018-03-27: 2 via TOPICAL

## 2018-03-27 MED ORDER — ZOLPIDEM TARTRATE 5 MG PO TABS
5.0000 mg | ORAL_TABLET | Freq: Every evening | ORAL | Status: DC | PRN
  Administered 2018-03-28: 5 mg via ORAL
  Filled 2018-03-27: qty 1

## 2018-03-27 MED ORDER — IOPAMIDOL (ISOVUE-370) INJECTION 76%
INTRAVENOUS | Status: AC
  Filled 2018-03-27: qty 100

## 2018-03-27 MED ORDER — LORAZEPAM 2 MG/ML IJ SOLN
0.0000 mg | Freq: Four times a day (QID) | INTRAMUSCULAR | Status: AC
  Administered 2018-03-27: 2 mg via INTRAVENOUS
  Administered 2018-03-27: 4 mg via INTRAVENOUS
  Administered 2018-03-27 – 2018-03-28 (×3): 2 mg via INTRAVENOUS
  Filled 2018-03-27 (×2): qty 1
  Filled 2018-03-27: qty 2
  Filled 2018-03-27 (×3): qty 1

## 2018-03-27 MED ORDER — SODIUM CHLORIDE 0.9 % IV SOLN
1.0000 g | Freq: Once | INTRAVENOUS | Status: AC
  Administered 2018-03-27: 1 g via INTRAVENOUS
  Filled 2018-03-27: qty 10

## 2018-03-27 MED ORDER — VITAMIN B-1 100 MG PO TABS
100.0000 mg | ORAL_TABLET | Freq: Every day | ORAL | Status: DC
  Administered 2018-03-27 – 2018-03-30 (×4): 100 mg via ORAL
  Filled 2018-03-27 (×4): qty 1

## 2018-03-27 MED ORDER — LORAZEPAM 1 MG PO TABS: 1.0000 mg | ORAL_TABLET | Freq: Four times a day (QID) | ORAL | Status: AC | PRN

## 2018-03-27 MED ORDER — LORAZEPAM 2 MG/ML IJ SOLN: 0.0000 mg | Freq: Two times a day (BID) | INTRAMUSCULAR | Status: DC

## 2018-03-27 MED ORDER — OCTREOTIDE LOAD VIA INFUSION
50.0000 ug | Freq: Once | INTRAVENOUS | Status: AC
  Administered 2018-03-27: 50 ug via INTRAVENOUS
  Filled 2018-03-27: qty 25

## 2018-03-27 MED ORDER — PROPOFOL 10 MG/ML IV BOLUS
INTRAVENOUS | Status: DC | PRN
  Administered 2018-03-27: 20 mg via INTRAVENOUS
  Administered 2018-03-27 (×4): 10 mg via INTRAVENOUS
  Administered 2018-03-27: 20 mg via INTRAVENOUS
  Administered 2018-03-27: 10 mg via INTRAVENOUS

## 2018-03-27 MED ORDER — SODIUM CHLORIDE 0.9 % IV SOLN: INTRAVENOUS | Status: DC

## 2018-03-27 MED ORDER — MORPHINE SULFATE (PF) 2 MG/ML IV SOLN
2.0000 mg | Freq: Once | INTRAVENOUS | Status: AC
  Administered 2018-03-27: 2 mg via INTRAVENOUS
  Filled 2018-03-27: qty 1

## 2018-03-27 MED ORDER — PANTOPRAZOLE SODIUM 40 MG IV SOLR
40.0000 mg | Freq: Two times a day (BID) | INTRAVENOUS | Status: DC
  Administered 2018-03-30: 40 mg via INTRAVENOUS
  Filled 2018-03-27 (×2): qty 40

## 2018-03-27 MED ORDER — SODIUM CHLORIDE 0.9 % IV SOLN
8.0000 mg/h | INTRAVENOUS | Status: AC
  Administered 2018-03-27 – 2018-03-29 (×7): 8 mg/h via INTRAVENOUS
  Filled 2018-03-27 (×12): qty 80

## 2018-03-27 MED ORDER — ONDANSETRON HCL 4 MG/2ML IJ SOLN
4.0000 mg | Freq: Four times a day (QID) | INTRAMUSCULAR | Status: DC | PRN
  Administered 2018-03-27 – 2018-03-28 (×5): 4 mg via INTRAVENOUS
  Filled 2018-03-27 (×5): qty 2

## 2018-03-27 MED ORDER — HYDRALAZINE HCL 20 MG/ML IJ SOLN
5.0000 mg | INTRAMUSCULAR | Status: DC | PRN
  Administered 2018-03-28 – 2018-03-29 (×4): 5 mg via INTRAVENOUS
  Filled 2018-03-27 (×4): qty 1

## 2018-03-27 MED ORDER — ADULT MULTIVITAMIN W/MINERALS CH
1.0000 | ORAL_TABLET | Freq: Every day | ORAL | Status: DC
  Administered 2018-03-27 – 2018-03-30 (×4): 1 via ORAL
  Filled 2018-03-27 (×4): qty 1

## 2018-03-27 MED ORDER — GLYCOPYRROLATE 0.2 MG/ML IJ SOLN
INTRAMUSCULAR | Status: DC | PRN
  Administered 2018-03-27: 0.2 mg via INTRAVENOUS

## 2018-03-27 MED ORDER — SODIUM CHLORIDE 0.9 % IV SOLN
50.0000 ug/h | INTRAVENOUS | Status: DC
  Administered 2018-03-27 – 2018-03-28 (×3): 50 ug/h via INTRAVENOUS
  Filled 2018-03-27 (×4): qty 1

## 2018-03-27 MED ORDER — SODIUM CHLORIDE 0.9 % IV SOLN
INTRAVENOUS | Status: DC
  Administered 2018-03-27 – 2018-03-28 (×2): via INTRAVENOUS

## 2018-03-27 MED ORDER — SODIUM CHLORIDE 0.9 % IV SOLN
2.0000 g | INTRAVENOUS | Status: DC
  Administered 2018-03-28: 2 g via INTRAVENOUS
  Filled 2018-03-27: qty 20

## 2018-03-27 MED ORDER — NADOLOL 20 MG PO TABS
20.0000 mg | ORAL_TABLET | Freq: Every day | ORAL | Status: DC
  Administered 2018-03-27 – 2018-03-30 (×4): 20 mg via ORAL
  Filled 2018-03-27 (×4): qty 1

## 2018-03-27 MED ORDER — BOOST / RESOURCE BREEZE PO LIQD CUSTOM
1.0000 | Freq: Three times a day (TID) | ORAL | Status: DC
  Administered 2018-03-27 – 2018-03-30 (×7): 1 via ORAL

## 2018-03-27 MED ORDER — THIAMINE HCL 100 MG/ML IJ SOLN: 100.0000 mg | Freq: Every day | INTRAMUSCULAR | Status: DC

## 2018-03-27 MED ORDER — SODIUM CHLORIDE 0.9 % IV SOLN
80.0000 mg | Freq: Once | INTRAVENOUS | Status: AC
  Administered 2018-03-27: 80 mg via INTRAVENOUS
  Filled 2018-03-27: qty 80

## 2018-03-27 MED ORDER — LIDOCAINE 2% (20 MG/ML) 5 ML SYRINGE
INTRAMUSCULAR | Status: DC | PRN
  Administered 2018-03-27: 60 mg via INTRAVENOUS

## 2018-03-27 MED ORDER — INFLUENZA VAC SPLIT QUAD 0.5 ML IM SUSY: 0.5000 mL | PREFILLED_SYRINGE | INTRAMUSCULAR | Status: DC

## 2018-03-27 MED ORDER — PNEUMOCOCCAL VAC POLYVALENT 25 MCG/0.5ML IJ INJ: 0.5000 mL | INJECTION | INTRAMUSCULAR | Status: DC | PRN

## 2018-03-27 MED ORDER — SODIUM CHLORIDE 0.9 % IV BOLUS
1000.0000 mL | Freq: Once | INTRAVENOUS | Status: AC
  Administered 2018-03-27: 1000 mL via INTRAVENOUS

## 2018-03-27 MED ORDER — METOCLOPRAMIDE HCL 5 MG/ML IJ SOLN
10.0000 mg | Freq: Once | INTRAMUSCULAR | Status: AC
  Administered 2018-03-27: 10 mg via INTRAVENOUS
  Filled 2018-03-27: qty 2

## 2018-03-27 MED ORDER — MORPHINE SULFATE (PF) 2 MG/ML IV SOLN
1.0000 mg | INTRAVENOUS | Status: DC | PRN
  Administered 2018-03-27 – 2018-03-28 (×3): 1 mg via INTRAVENOUS
  Filled 2018-03-27 (×3): qty 1

## 2018-03-27 MED ORDER — FOLIC ACID 1 MG PO TABS
1.0000 mg | ORAL_TABLET | Freq: Every day | ORAL | Status: DC
  Administered 2018-03-27 – 2018-03-30 (×4): 1 mg via ORAL
  Filled 2018-03-27 (×4): qty 1

## 2018-03-27 MED ORDER — LORAZEPAM 2 MG/ML IJ SOLN
1.0000 mg | Freq: Four times a day (QID) | INTRAMUSCULAR | Status: AC | PRN
  Administered 2018-03-27: 1 mg via INTRAVENOUS

## 2018-03-27 MED ORDER — ALBUMIN HUMAN 25 % IV SOLN: 100.0000 g | Freq: Once | INTRAVENOUS | Status: DC

## 2018-03-27 MED ORDER — LACTATED RINGERS IV SOLN
INTRAVENOUS | Status: DC | PRN
  Administered 2018-03-27 (×2): via INTRAVENOUS

## 2018-03-27 SURGICAL SUPPLY — 15 items

## 2018-03-27 NOTE — Progress Notes (Signed)
PROGRESS NOTE-not billable (see H&P on 9/21)  Michael Ayers ZOX:[id number] DOB: [birth year] DOA: 03/26/2018 PCP: System, Provider Not In  HPI/Brief Narrative  Michael Ayers is a 42 y.o. year old male with medical history significant for alcohol abuse, cirrhosis, esophageal varices and prior admission on 01/2018 for bleeding ulcer who presented on 03/26/2018 with several days of hematemesis.  Denies any hematuria, melena or bright red blood per rectum.  Reports occasionally using NSAIDs.  And was found to have nonbleeding gastric and esophageal varices with no signs of bleeding on EGD on 03/27/2018..  Subjective No acute complaints this morning  Assessment/Plan:  #Hematemesis, stable.  EGD on 9/21 showed nonbleeding gastric/esophageal varices and normal duodenum.  Has been maintained on empiric octreotide, ceftriaxone and Protonix.  Appreciate GI recommendations  #Alcoholic cirrhosis with thrombocytopenia.  Patient continues to be a heavy drinker.  #Chronic anemia, stable.  Likely related to baseline hemoglobin 11.  Initially 13 on admission likely hemoconcentration.  Has remained stable at 11 on repeat.   #Continue alcohol abuse.  Reports last drink day prior to admission.  Monitor on CIWA protocol.  #Hypertension, improving.  On home nadolol, PRN hydralazine IV  #Abnormal LFTs, improving.  Recent hepatitis panel negative on previous admission.  Avoid hepatotoxic agents  #History of duodenitis and chronic gastritis, stable.  Surgery for peptic ulcer disease in the past.  Currently on Protonix drip  Code Status: Full code  Family Communication: No family at bedside  Disposition Plan: Pending GI recommendations regarding normal EGD and stable hemoglobin, anticipate discharge on 9/22   Consultants:   Treatment Team:   Carman Ching, MD    Procedures:  EGD-9/21: Grade 1 esophageal varices, gastric varices without bleeding, portal hypertensive  gastropathy  Antimicrobials: Anti-infectives (From admission, onward)   Start     Dose/Rate Route Frequency Ordered Stop   03/28/18 0100  cefTRIAXone (ROCEPHIN) 2 g in sodium chloride 0.9 % 100 mL IVPB     2 g 200 mL/hr over 30 Minutes Intravenous Every 24 hours 03/27/18 0615     03/27/18 0130  cefTRIAXone (ROCEPHIN) 1 g in sodium chloride 0.9 % 100 mL IVPB     1 g 200 mL/hr over 30 Minutes Intravenous  Once 03/27/18 0127 03/27/18 0234         Cultures:  none  Telemetry:none  DVT prophylaxis:  none   Objective: Vitals:   03/27/18 1400 03/27/18 1500 03/27/18 2032 03/27/18 2046  BP: (!) 185/107 (!) 183/111 (!) 159/97 (!) 143/85  Pulse:      Resp: 13 (!) 29 18 20   Temp:   98.9 F (37.2 C)   TempSrc:   Oral   SpO2:   97% 98%  Weight:      Height:        Intake/Output Summary (Last 24 hours) at 03/27/2018 2105 Last data filed at 03/27/2018 2022 Gross per 24 hour  Intake 4123.04 ml  Output 5175 ml  Net -1051.96 ml   Filed Weights   03/27/18 0346  Weight: 72.3 kg    Exam:  Constitutional:normal appearing male Eyes: EOMI, anicteric, normal conjunctivae ENMT: Oropharynx with moist mucous membranes, normal dentition Cardiovascular: RRR no MRGs, with no peripheral edema Respiratory: Normal respiratory effort, clear breath sounds  Abdomen: Soft,non-tender, Skin: No rash ulcers, or lesions. Without skin tenting  Neurologic: Grossly no focal neuro deficit. Psychiatric:Appropriate affect, and mood. Mental status AAOx3  Data Reviewed: CBC: Recent Labs  Lab 03/26/18 2020 03/27/18 0241 03/27/18 0747 03/27/18 1358 03/27/18 2007  WBC 2.0* 1.7* 1.8* 1.4* 1.5*  HGB 13.7 11.8* 11.5* 11.8* 11.9*  HCT 42.2 36.8* 35.3* 36.6* 35.9*  MCV 91.9 92.7 92.2 92.2 91.8  PLT 75* 58* 58* 53* 55*   Basic Metabolic Panel: Recent Labs  Lab 03/26/18 2020 03/27/18 0747  NA 141 139  K 3.8 4.2  CL 106 105  CO2 23 23  GLUCOSE 111* 109*  BUN 7 9  CREATININE 0.72 0.67   CALCIUM 8.9 8.3*   GFR: Estimated Creatinine Clearance: 108.5 mL/min (by C-G formula based on SCr of 0.67 mg/dL). Liver Function Tests: Recent Labs  Lab 03/26/18 2020 03/27/18 0747  AST 148* 110*  ALT 55* 45*  ALKPHOS 105 85  BILITOT 1.8* 2.3*  PROT 8.1 7.3  ALBUMIN 3.5 3.1*   Recent Labs  Lab 03/26/18 2020  LIPASE 71*   Recent Labs  Lab 03/27/18 0241  AMMONIA 92*   Coagulation Profile: Recent Labs  Lab 03/26/18 2020  INR 1.15   Cardiac Enzymes: No results for input(s): CKTOTAL, CKMB, CKMBINDEX, TROPONINI in the last 168 hours. BNP (last 3 results) No results for input(s): PROBNP in the last 8760 hours. HbA1C: No results for input(s): HGBA1C in the last 72 hours. CBG: Recent Labs  Lab 03/27/18 0603  GLUCAP 92   Lipid Profile: No results for input(s): CHOL, HDL, LDLCALC, TRIG, CHOLHDL, LDLDIRECT in the last 72 hours. Thyroid Function Tests: No results for input(s): TSH, T4TOTAL, FREET4, T3FREE, THYROIDAB in the last 72 hours. Anemia Panel: No results for input(s): VITAMINB12, FOLATE, FERRITIN, TIBC, IRON, RETICCTPCT in the last 72 hours. Urine analysis:    Component Value Date/Time   COLORURINE AMBER (A) 03/26/2018 2016   APPEARANCEUR CLEAR 03/26/2018 2016   LABSPEC 1.015 03/26/2018 2016   PHURINE 6.0 03/26/2018 2016   GLUCOSEU NEGATIVE 03/26/2018 2016   HGBUR SMALL (A) 03/26/2018 2016   BILIRUBINUR NEGATIVE 03/26/2018 2016   KETONESUR NEGATIVE 03/26/2018 2016   PROTEINUR 100 (A) 03/26/2018 2016   UROBILINOGEN 1.0 03/27/2011 1714   NITRITE NEGATIVE 03/26/2018 2016   LEUKOCYTESUR NEGATIVE 03/26/2018 2016   Sepsis Labs: @LABRCNTIP (procalcitonin:4,lacticidven:4)  )No results found for this or any previous visit (from the past 240 hour(s)).    Studies: Ct Abdomen Pelvis W Wo Contrast  Result Date: 03/27/2018 CLINICAL DATA:  Cirrhosis, hematemesis and gastric varices, portal gastropathy and grade 1 esophageal varices by endoscopy. EXAM: CT  ABDOMEN AND PELVIS WITHOUT AND WITH CONTRAST TECHNIQUE: Multidetector CT imaging of the abdomen and pelvis was performed following the standard protocol before and following the bolus administration of intravenous contrast. CONTRAST:  ISOVUE-370 IOPAMIDOL (ISOVUE-370) INJECTION 76% COMPARISON:  None. FINDINGS: Lower chest: Mild bibasilar atelectasis. Hepatobiliary: The liver demonstrates cirrhosis with nodular contour. The right lobe demonstrates some relative atrophy. No evidence of focal hepatic mass or biliary ductal dilatation. The gallbladder contains some small dependent calcified calculi. No evidence of gallbladder inflammation or distention. Pancreas: Unremarkable. No pancreatic ductal dilatation or surrounding inflammatory changes. Spleen: The spleen is moderately enlarged. No evidence of splenic mass or abnormal perfusion. The splenic artery and vein are patent. Adrenals/Urinary Tract: Adrenal glands are unremarkable. Kidneys are normal, without renal calculi, focal lesion, or hydronephrosis. Bladder is distended. Stomach/Bowel: Bowel shows mild dilatation of left-sided jejunal loops likely representing some degree ileus. No overt obstruction, significant bowel inflammation or lesion is identified. No free intraperitoneal air is detected. Vascular/Lymphatic: No enlarged lymph nodes. Arterial vascular evaluation shows normal patency of the abdominal aorta and visceral branches. Hepatic arterial supply is via  of traditional branch vessel anatomy of the celiac axis. The superior mesenteric artery, bilateral renal arteries and inferior mesenteric arteries show normal patency. Venous evaluation demonstrates prominent gastric varices projecting into the gastric lumen along the cardia and proximal fundus. Protruding gastric varices measure up to 12 mm in estimated maximal diameter. Prominent portosystemic shunting is noted with short gastric branches off of the splenic vein supplying the gastric varices and  emptying via a patent and tortuous gastro-renal shunt draining into the left renal vein. This shunt reaches a maximal caliber of approximately 15-16 mm. No thrombus identified in the varicosities or shunt. There are some small distal esophageal varices noted. No other venous shunts are identified. Mesenteric veins, splenic vein and portal vein show normal patency without evidence of thrombus. Intrahepatic portal veins and hepatic veins are well demonstrated and show normal patency and branching anatomy. The IVC, renal veins, iliac veins and common femoral veins demonstrate normal patency. Reproductive: Prostate is unremarkable. Other: No hernias identified. There is a trace amount of ascites around the liver. Musculoskeletal: No acute or significant osseous findings. IMPRESSION: 1. Evidence of cirrhosis and portal hypertension with prominent portosystemic shunting primarily via branches of the splenic vein supplying prominent gastric varices and emptying via a large gastro-renal shunt into the left renal vein. Gastric varices are the presumed source of upper GI bleeding given their protrusion into the gastric lumen at the level of the cardia and fundus and a minimal component of distal esophageal varices by comparison. Based on CT anatomy, the patient would potentially be a candidate for BRTO to treat bleeding gastric varices. A TIPS procedure would not likely be needed given the predominance of gastric varices. 2. Trace ascites surrounding the liver. 3. Moderate splenomegaly. 4. Cholelithiasis without evidence of cholecystitis or biliary obstruction. 5. Cirrhosis of the liver without evidence of hepatic masses. 6. Probable component of mild ileus involving the jejunum without significant bowel obstruction. Electronically Signed   By: Irish LackGlenn  Yamagata M.D.   On: 03/27/2018 16:49    Scheduled Meds: . feeding supplement  1 Container Oral TID BM  . folic acid  1 mg Oral Daily  . [START ON 03/28/2018] Influenza vac  split quadrivalent PF  0.5 mL Intramuscular Tomorrow-1000  . iopamidol      . LORazepam  0-4 mg Intravenous Q6H   Followed by  . [START ON 03/29/2018] LORazepam  0-4 mg Intravenous Q12H  . multivitamin with minerals  1 tablet Oral Daily  . nadolol  20 mg Oral Daily  . [START ON 03/30/2018] pantoprazole  40 mg Intravenous Q12H  . thiamine  100 mg Oral Daily   Or  . thiamine  100 mg Intravenous Daily    Continuous Infusions: . sodium chloride Stopped (03/27/18 1132)  . [START ON 03/28/2018] cefTRIAXone (ROCEPHIN)  IV    . octreotide  (SANDOSTATIN)    IV infusion 50 mcg/hr (03/27/18 1428)  . pantoprozole (PROTONIX) infusion 8 mg/hr (03/27/18 1427)     LOS: 0 days     Laverna PeaceShayla D Rex Magee, MD [facility] Pager [phone]  If 7PM-7AM, please contact night-coverage [email or web address] Password [email or web address] 03/27/2018, 9:05 PM

## 2018-03-27 NOTE — Op Note (Signed)
[facility] Patient Name: Michael Ayers Procedure Date : 03/27/2018 MRN: [id number] Attending MD: Tresea Mall Dr., MD Date of Birth: Jul 27, 1975 CSN: [id number] Age: 42 Admit Type: Inpatient Procedure:                Upper GI endoscopy Indications:              Hematemesis Providers:                Fayrene Fearing L. Verlyn Lambert Dr., MD, Harold Barban, RN,                            Judithann Sauger, Technician, Elease Hashimoto "Trish"                            Earna Coder, CRNA Referring MD:              Medicines:                Propofol per Anesthesia Complications:            No immediate complications. Estimated Blood Loss:     Estimated blood loss: none. Procedure:                Pre-Anesthesia Assessment:                           - Prior to the procedure, a History and Physical                            was performed, and patient medications and                            allergies were reviewed. The patient's tolerance of                            previous anesthesia was also reviewed. The risks                            and benefits of the procedure and the sedation                            options and risks were discussed with the patient.                            All questions were answered, and informed consent                            was obtained. Prior Anticoagulants: The patient has                            taken no previous anticoagulant or antiplatelet                            agents. ASA Grade Assessment: III - A patient with                            severe  systemic disease. After reviewing the risks                            and benefits, the patient was deemed in                            satisfactory condition to undergo the procedure.                           After obtaining informed consent, the endoscope was                            passed under direct vision. Throughout the                            procedure, the patient's blood pressure,  pulse, and                            oxygen saturations were monitored continuously. The                            [id number] ([id number] Olympus adult EGD was introduced                            through the mouth, and advanced to the third part                            of duodenum. The upper GI endoscopy was                            accomplished without difficulty. The patient                            tolerated the procedure well. Scope In: Scope Out: Findings:      Grade I varices were found in the middle third of the esophagus and in       the lower third of the esophagus.      Varices with no bleeding were found in the cardia. They were large in       largest diameter.      Portal hypertensive gastropathy was found in the gastric fundus.      The examined duodenum was normal. Impression:               - Grade I esophageal varices.                           - Gastric varices, without bleeding.                           - Portal hypertensive gastropathy.                           - Normal examined duodenum.                           - No specimens  collected. Moderate Sedation:      See anesthesia note, no moderate sedation. Recommendation:           - Perform CT scan (computed tomography) of the                            abdomen with contrast at appointment to be                            scheduled.                           - Clear liquid diet. Procedure Code(s):        --- Professional ---                           [phone], Esophagogastroduodenoscopy, flexible,                            transoral; diagnostic, including collection of                            specimen(s) by brushing or washing, when performed                            (separate procedure) Diagnosis Code(s):        --- Professional ---                           I86.4, Gastric varices                           I85.00, Esophageal varices without bleeding                           K76.6, Portal  hypertension                           K92.0, Hematemesis CPT copyright 2017 [employer] Medical Association. All rights reserved. The codes documented in this report are preliminary and upon coder review may  be revised to meet current compliance requirements. Tresea Mall Dr., MD 03/27/2018 11:56:32 AM This report has been signed electronically. Number of Addenda: 0

## 2018-03-27 NOTE — Progress Notes (Signed)
CRITICAL VALUE ALERT  Critical Value:  WBC  Date & Time Notied:  03/27/18 1420  Provider Notified: YES  Orders Received/Actions taken: N/A

## 2018-03-27 NOTE — Progress Notes (Signed)
Initial Nutrition Assessment  DOCUMENTATION CODES:   (Will assess for malnutrition at follow-up. )  INTERVENTION:  - Will order Boost Breeze TID, each supplement provides 250 kcal and 9 grams of protein. - Continue to encourage PO intakes as tolerated. - Diet advancement as medically feasible. - Will perform NFPE and obtain PTA information at follow-up.   NUTRITION DIAGNOSIS:   Inadequate protein intake related to other (see comment)(current diet order) as evidenced by other (comment)(CLD does not meet estimated protein need).  GOAL:   Patient will meet greater than or equal to 90% of their needs  MONITOR:   PO intake, Supplement acceptance, Diet advancement, Weight trends, Labs  REASON FOR ASSESSMENT:   Malnutrition Screening Tool  ASSESSMENT:   42 y.o. male with hx significant for continued alcohol abuse and ulcer disease with hx of surgery for peptic ulcer. During admission in 01/2018 he had EGD revealing minimal esophageal varices but somewhat prominent gastric varices. Patient has continued to drink. Patient presented to the ED with hematemesis, mild epigastric and right upper quadrant pain, and feeling lightheaded. He has continued to vomit up bright red blood and has been started on IV Protonix and octreotide. The patient understands English fairly well and speaks [place] fairly well.  BMI indicates overweight status. Patient had upper endoscopy earlier today and diet advanced from NPO to CLD following procedure. Patient being taken to EGD at the time of first attempted visit. At the time of second attempted visit patient requests RD hand him urinal and states he needs to urinate at that time. Unable to obtain any PTA information at this time. Dr. Randa EvensEdwards' note from prior to EGD states that if bleeding persists, patient may need to undergo TIPS. Per his op note, findings include: grade 1 esophageal varices, gastric varices without bleeding, portal hypertensive gastropathy, and  normal duodenum.   Per chart review, current weight is 159 lb and weight on 01/16/18 was 151 lb. At [facility] 10/09/17 he weighed 168 lb. Based on this, patient has lost 9 lb (5% body weight) in the past 5 months.    Medications reviewed; 1 mg folic acid/day, 10 mg IV Reglan x1 dose this AM, daily multivitamin with minerals, 40 mg IV Protonix BID, 100 mg thiamine/day.  Labs reviewed; Ca: 8.3 mg/dL, LFTs elevated.  IVF; NS @ 100 mL/hr.    NUTRITION - FOCUSED PHYSICAL EXAM:  Unable to perform at this time; will attempt at follow-up.   Diet Order:   Diet Order            Diet clear liquid Room service appropriate? Yes; Fluid consistency: Thin  Diet effective now              EDUCATION NEEDS:   Not appropriate for education at this time  Skin:  Skin Assessment: Reviewed RN Assessment  Last BM:  9/20  Height:   Ht Readings from Last 1 Encounters:  03/27/18 5\' 6"  (1.676 m)    Weight:   Wt Readings from Last 1 Encounters:  03/27/18 72.3 kg    Ideal Body Weight:  61.82 kg  BMI:  Body mass index is 25.73 kg/m.  Estimated Nutritional Needs:   Kcal:  [id number]  Protein:  80-90 grams  Fluid:  >/= 2 L/day     Trenton GammonJessica Deshunda Thackston, MS, RD, LDN, [facility] Inpatient Clinical Dietitian Pager # [phone] After hours/weekend pager # [phone]

## 2018-03-27 NOTE — Progress Notes (Signed)
Interpretor used for patient to explain procedure and anesthesia with CRNA and MDA.

## 2018-03-27 NOTE — ED Notes (Signed)
Pt diaphoretic and has an emesis bag full of bloody emesis

## 2018-03-27 NOTE — Interval H&P Note (Signed)
History and Physical Interval Note:  03/27/2018 11:30 AM  Michael Ayers  has presented today for surgery, with the diagnosis of variceal bleed  The various methods of treatment have been discussed with the patient and family. After consideration of risks, benefits and other options for treatment, the patient has consented to  Procedure(s): ESOPHAGOGASTRODUODENOSCOPY (EGD) WITH PROPOFOL (N/A) as a surgical intervention .  The patient's history has been reviewed, patient examined, no change in status, stable for surgery.  I have reviewed the patient's chart and labs.  Questions were answered to the patient's satisfaction.     Tresea MallJames L Joseantonio Dittmar

## 2018-03-27 NOTE — H&P (Addendum)
History and Physical    Michael Ayers ZHY:[id number] DOB: 1976-05-31 DOA: 03/26/2018  Referring MD/NP/PA:   PCP: System, Provider Not In   Patient coming from:  The patient is coming from home.  At baseline, pt is independent for most of ADL.  Chief Complaint: Hematemesis  HPI: Michael Ayers is a 42 y.o. male with medical history significant of of hypertension, alcohol abuse, esophageal varices, duodenitis, chronic gastritis, portal hypertensive gastropathy, bleeding/perforated peptic ulcer which required surgery, who presents with hematemesis.  Patient was recently hospitalized from 7/11-7/13 due to hematemesis. He had EGD 7/13, which showed grade I esophageal varices, portal hypertensive gastropathy, and chronic gastritis. Pt states that he has had 5 episodes of hematemesis with brown-red-colored blood today.  No black stool. He has mild abdominal pain, which is located in the epigastric area, sharp, nonradiating, mild. Denies dizziness, lightheadedness.  No chest pain, shortness breath, cough.  No fever or chills.  No symptoms of UTI.  Pt continues to drink alcohol, last drinking was yesterday.  ED Course: pt was found to have hemoglobin 13.7, WBC 2.0, thrombocytopenia with platelets 75, electrolytes renal function okay, INR 1.15, lipase is 71, negative urinalysis, abnormal liver function (ALP 105, AST 148, ALT 55, total bilirubin 1.8), temperature normal, heart rate in 90s, oxygen saturation 97% on room air.  Patient is placed on telemetry bed of observation.  Review of Systems:   General: no fevers, chills, no body weight gain, has poor appetite, has fatigue HEENT: no blurry vision, hearing changes or sore throat Respiratory: no dyspnea, coughing, wheezing CV: no chest pain, no palpitations GI: Has hematemesis, epigastric abdominal pain, but no diarrhea or constipation.   GU: no dysuria, burning on urination, increased urinary frequency, hematuria  Ext: no leg edema Neuro: no  unilateral weakness, numbness, or tingling, no vision change or hearing loss Skin: no rash, no skin tear. MSK: No muscle spasm, no deformity, no limitation of range of movement in spin Heme: No easy bruising.  Travel history: No recent long distant travel.  Allergy: No Known Allergies  Past Medical History:  Diagnosis Date  . Alcohol abuse   . Duodenitis   . Esophageal varices (HCC)   . Essential hypertension   . Perforated peptic ulcer (HCC)     Past Surgical History:  Procedure Laterality Date  . ESOPHAGOGASTRODUODENOSCOPY (EGD) WITH PROPOFOL N/A 01/16/2018   Procedure: ESOPHAGOGASTRODUODENOSCOPY (EGD) WITH PROPOFOL;  Surgeon: Vida RiggerMagod, Marc, MD;  Location[facility];  Service: Endoscopy;  Laterality: N/A;  . Perforated peptic ulcer      Social History:  reports that he has never smoked. He has never used smokeless tobacco. He reports that he drinks about 4.0 - 5.0 standard drinks of alcohol per week. He reports that he does not use drugs.  Family History:  Family History  Problem Relation Age of Onset  . Diabetes Mellitus II Mother      Prior to Admission medications   Medication Sig Start Date End Date Taking? Authorizing Provider  nadolol (CORGARD) 20 MG tablet Take 1 tablet (20 mg total) by mouth daily. 01/16/18   Tyrone NineGrunz, Ryan B, MD  pantoprazole (PROTONIX) 40 MG tablet Take 1 tablet (40 mg total) by mouth daily. 01/16/18   Tyrone NineGrunz, Ryan B, MD    Physical Exam: Vitals:   03/27/18 0105 03/27/18 0115 03/27/18 0130 03/27/18 0145  BP: (!) 170/102 (!) 175/113 (!) 171/106 (!) 163/109  Pulse: 82 91 88 97  Resp: 15 (!) 28 17 17   Temp:  TempSrc:      SpO2: 99% 99% 97% 100%   General: Not in acute distress HEENT:       Eyes: PERRL, EOMI, no scleral icterus.       ENT: No discharge from the ears and nose, no pharynx injection, no tonsillar enlargement.        Neck: No JVD, no bruit, no mass felt. Heme: No neck lymph node enlargement. Cardiac: S1/S2, RRR, No murmurs, No  gallops or rubs. Respiratory: No rales, wheezing, rhonchi or rubs. GI: Soft, nondistended, has tenderness in central and epigastric area, no rebound pain, no organomegaly, BS present. GU: No hematuria Ext: No pitting leg edema bilaterally. 2+DP/PT pulse bilaterally. Musculoskeletal: No joint deformities, No joint redness or warmth, no limitation of ROM in spin. Skin: No rashes.  Neuro: Alert, oriented X3, cranial nerves II-XII grossly intact, moves all extremities normally.  Psych: Patient is not psychotic, no suicidal or hemocidal ideation.  Labs on Admission: I have personally reviewed following labs and imaging studies  CBC: Recent Labs  Lab 03/26/18 2020  WBC 2.0*  HGB 13.7  HCT 42.2  MCV 91.9  PLT 75*   Basic Metabolic Panel: Recent Labs  Lab 03/26/18 2020  NA 141  K 3.8  CL 106  CO2 23  GLUCOSE 111*  BUN 7  CREATININE 0.72  CALCIUM 8.9   GFR: CrCl cannot be calculated (Unknown ideal weight.). Liver Function Tests: Recent Labs  Lab 03/26/18 2020  AST 148*  ALT 55*  ALKPHOS 105  BILITOT 1.8*  PROT 8.1  ALBUMIN 3.5   Recent Labs  Lab 03/26/18 2020  LIPASE 71*   No results for input(s): AMMONIA in the last 168 hours. Coagulation Profile: Recent Labs  Lab 03/26/18 2020  INR 1.15   Cardiac Enzymes: No results for input(s): CKTOTAL, CKMB, CKMBINDEX, TROPONINI in the last 168 hours. BNP (last 3 results) No results for input(s): PROBNP in the last 8760 hours. HbA1C: No results for input(s): HGBA1C in the last 72 hours. CBG: No results for input(s): GLUCAP in the last 168 hours. Lipid Profile: No results for input(s): CHOL, HDL, LDLCALC, TRIG, CHOLHDL, LDLDIRECT in the last 72 hours. Thyroid Function Tests: No results for input(s): TSH, T4TOTAL, FREET4, T3FREE, THYROIDAB in the last 72 hours. Anemia Panel: No results for input(s): VITAMINB12, FOLATE, FERRITIN, TIBC, IRON, RETICCTPCT in the last 72 hours. Urine analysis:    Component Value  Date/Time   COLORURINE AMBER (A) 03/26/2018 2016   APPEARANCEUR CLEAR 03/26/2018 2016   LABSPEC 1.015 03/26/2018 2016   PHURINE 6.0 03/26/2018 2016   GLUCOSEU NEGATIVE 03/26/2018 2016   HGBUR SMALL (A) 03/26/2018 2016   BILIRUBINUR NEGATIVE 03/26/2018 2016   KETONESUR NEGATIVE 03/26/2018 2016   PROTEINUR 100 (A) 03/26/2018 2016   UROBILINOGEN 1.0 03/27/2011 1714   NITRITE NEGATIVE 03/26/2018 2016   LEUKOCYTESUR NEGATIVE 03/26/2018 2016   Sepsis Labs: @LABRCNTIP (procalcitonin:4,lacticidven:4) )No results found for this or any previous visit (from the past 240 hour(s)).   Radiological Exams on Admission: No results found.   EKG:  Not done in ED, will get one.   Assessment/Plan Principal Problem:   Hematemesis Active Problems:   Alcohol abuse   Esophageal varices (HCC)   Duodenitis   Essential hypertension   Abnormal LFTs   Thrombocytopenia (HCC)   Abdominal pain   Hematemesis: likely from UGB. He had EGD 7/13, which showed grade I esophageal varices, portal hypertensive gastropathy and chronic gastritis. Hemoglobin 13.7  Hemodynamically stable. [facility] Dr. Ramon Dredge was  consulted by EDP, will see pt in AM.  - will place in tele bed for obs - GI consulted by Ed, will follow up recommendations - NPO for possible EGD - IVF: 1L NS, then 100  mL/hr - continue IV pantoprazole gtt and octreotide drip - IV Rocephin - Zofran IV for nausea - Avoid NSAIDs and SQ heparin - Maintain IV access (2 large bore IVs if possible). - Monitor closely and follow q6h cbc, transfuse as necessary, if Hgb<7.0 - LaB: INR, PTT and type screen  Alcohol abuse: Patient continues to drink alcohol. -Did counseling about the importance of quitting drinking -CIWA protocol  Hx of Duodenitis and chronic gastritis -on protonix gtt  Essential hypertension: Blood pressure 163/109.   -continue nadalol - IV hydralazine as needed  Abnormal LFTs: Likely due to alcohol abuse and possible liver  cirrhosis. He had negative hepatitis panel in previous admission -Avoid using liver toxic medication, such as Tylenol Tylenol  -Check ammonia level  Thrombocytopenia: Platelets 75.  Patient may have alcoholic liver cirrhosis - follow-up with CBC.  Abdominal pain: Patient has tenderness in epigastric and central abdomen.  Most likely due to chronic alcoholic gastritis.  Low suspicions for SBP.  Lipase 71. -PRN morphine for pain - PRN Zofran for nausea - on protonix   DVT ppx: SCD Code Status: Full code Family Communication: None at bed side.  Disposition Plan:  Anticipate discharge back to previous home environment Consults called:  [facility], Dr. Ramon Dredge Admission status: Obs / tele     Date of Service 03/27/2018    Lorretta Harp [facility] Pager [phone]  If 7PM-7AM, please contact night-coverage [email or web address] Password [facility] 03/27/2018, 2:46 AM

## 2018-03-27 NOTE — Anesthesia Postprocedure Evaluation (Signed)
Anesthesia Post Note  Patient: Michael Ayers  Procedure(s) Performed: ESOPHAGOGASTRODUODENOSCOPY (EGD) WITH PROPOFOL (N/A )     Patient location during evaluation: Endoscopy Anesthesia Type: MAC Level of consciousness: awake Pain management: pain level controlled Vital Signs Assessment: post-procedure vital signs reviewed and stable Respiratory status: spontaneous breathing Cardiovascular status: stable Postop Assessment: no apparent nausea or vomiting Anesthetic complications: no    Last Vitals:  Vitals:   03/27/18 1151 03/27/18 1200  BP: 128/71 (!) 166/99  Pulse: 73 69  Resp: 14 12  Temp: 36.6 C   SpO2: 96% 96%    Last Pain:  Vitals:   03/27/18 1200  TempSrc:   PainSc: 0-No pain                 Joss Friedel

## 2018-03-27 NOTE — Consult Note (Signed)
[facility] CONSULT Reason for consult: Upper GI bleed Referring Physician: [facility].  PCP: None primary GI: Dr. Watt Climes.  Michael Ayers is an 42 y.o. male.  HPI: He is a medical history significant for continued alcohol abuse.  He has continued to drink.He is from [place] and was transferred to [facility] 7/19 due to lack of GI coverage in .  During that admission he had EGD by Dr. Watt Climes revealing minimal esophageal varices but somewhat prominent gastric varices.  Patient has unfortunately continued to drink.  He also has a history of ulcer disease.  He has a history of operation for peptic ulcer done at another location.  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  During his last admission his MELD score was 13.  Unfortunately he has continued to drink and when asked says he drinks too much..  He came back to the [facility] emergency room with hematemesis.  He is also having mild epigastric and right upper quadrant pain feels lightheaded.  His hemoglobin in the emergency room was 13 platelet count was in the 70s and WBC 2.0.  INR was normal at 1.1.  He has continued to vomit up bright red blood and has been started on IV Protonix and octreotide.  The patient understands English fairly well and speaks [place] fairly well.  He indicates that he feels better and has not had any further bleeding overnight.  He is currently being treated with IV octreotide and Protonix.  Past Medical History:  Diagnosis Date  . Alcohol abuse   . Duodenitis   . Esophageal varices ([place])   . Essential hypertension   . Perforated peptic ulcer ([place])     Past Surgical History:  Procedure Laterality Date  . ESOPHAGOGASTRODUODENOSCOPY (EGD) WITH PROPOFOL N/A 01/16/2018   Procedure: ESOPHAGOGASTRODUODENOSCOPY (EGD) WITH PROPOFOL;  Surgeon: Clarene Essex, MD;  Location: [place];  Service: Endoscopy;  Laterality: N/A;  . Perforated peptic ulcer      Family History  Problem Relation  Age of Onset  . Diabetes Mellitus II Mother     Social History:  reports that he has never smoked. He has never used smokeless tobacco. He reports that he drinks about 4.0 - 5.0 standard drinks of alcohol per week. He reports that he does not use drugs.  Allergies: No Known Allergies  Medications; Prior to Admission medications   Medication Sig Start Date End Date Taking? Authorizing Provider  nadolol (CORGARD) 20 MG tablet Take 1 tablet (20 mg total) by mouth daily. Patient not taking: Reported on 03/27/2018 01/16/18   Patrecia Pour, MD  pantoprazole (PROTONIX) 40 MG tablet Take 1 tablet (40 mg total) by mouth daily. Patient not taking: Reported on 03/27/2018 01/16/18   Patrecia Pour, MD   . folic acid  1 mg Oral Daily  . [START ON 03/28/2018] Influenza vac split quadrivalent PF  0.5 mL Intramuscular Tomorrow-1000  . LORazepam  0-4 mg Intravenous Q6H   Followed by  . [START ON 03/29/2018] LORazepam  0-4 mg Intravenous Q12H  . multivitamin with minerals  1 tablet Oral Daily  . nadolol  20 mg Oral Daily  . [START ON 03/30/2018] pantoprazole  40 mg Intravenous Q12H  . thiamine  100 mg Oral Daily   Or  . thiamine  100 mg Intravenous Daily   PRN Meds hydrALAZINE, LORazepam **OR** LORazepam, morphine injection, ondansetron **OR** ondansetron (ZOFRAN) IV, zolpidem Results for orders placed or performed during the hospital encounter of 03/26/18 (from the  past 48 hour(s))  Sample to Blood Bank     Status: None   Collection Time: 03/26/18  8:14 PM  Result Value Ref Range   Blood Bank Specimen SAMPLE AVAILABLE FOR TESTING    Sample Expiration      03/27/2018 Performed at [facility], [address], [place], [place] [address]   Urinalysis, Routine w reflex microscopic     Status: Abnormal   Collection Time: 03/26/18  8:16 PM  Result Value Ref Range   Color, Urine AMBER (A) YELLOW    Comment: BIOCHEMICALS MAY BE AFFECTED BY COLOR   APPearance CLEAR CLEAR   Specific Gravity, Urine  1.015 1.005 - 1.030   pH 6.0 5.0 - 8.0   Glucose, UA NEGATIVE NEGATIVE mg/dL   Hgb urine dipstick SMALL (A) NEGATIVE   Bilirubin Urine NEGATIVE NEGATIVE   Ketones, ur NEGATIVE NEGATIVE mg/dL   Protein, ur 100 (A) NEGATIVE mg/dL   Nitrite NEGATIVE NEGATIVE   Leukocytes, UA NEGATIVE NEGATIVE   RBC / HPF 0-5 0 - 5 RBC/hpf   WBC, UA 0-5 0 - 5 WBC/hpf   Bacteria, UA NONE SEEN NONE SEEN    Comment: Performed at [facility], [place] [address], [place], [place] [address]  Lipase, blood     Status: Abnormal   Collection Time: 03/26/18  8:20 PM  Result Value Ref Range   Lipase 71 (H) 11 - 51 U/L    Comment: Performed at [facility] [address], [place],  [address]  Comprehensive metabolic panel     Status: Abnormal   Collection Time: 03/26/18  8:20 PM  Result Value Ref Range   Sodium 141 135 - 145 mmol/L   Potassium 3.8 3.5 - 5.1 mmol/L   Chloride 106 98 - 111 mmol/L   CO2 23 22 - 32 mmol/L   Glucose, Bld 111 (H) 70 - 99 mg/dL   BUN 7 6 - 20 mg/dL   Creatinine, Ser 0.72 0.61 - 1.24 mg/dL   Calcium 8.9 8.9 - 10.3 mg/dL   Total Protein 8.1 6.5 - 8.1 g/dL   Albumin 3.5 3.5 - 5.0 g/dL   AST 148 (H) 15 - 41 U/L   ALT 55 (H) 0 - 44 U/L   Alkaline Phosphatase 105 38 - 126 U/L   Total Bilirubin 1.8 (H) 0.3 - 1.2 mg/dL   GFR calc non Af Amer >60 >60 mL/min   GFR calc Af Amer >60 >60 mL/min    Comment: (NOTE) The eGFR has been calculated using the CKD EPI equation. This calculation has not been validated in all clinical situations. eGFR's persistently <60 mL/min signify possible Chronic Kidney Disease.    Anion gap 12 5 - 15    Comment: Performed at [facility] [address] [place], [place] [address]  CBC     Status: Abnormal   Collection Time: 03/26/18  8:20 PM  Result Value Ref Range   WBC 2.0 (L) 4.0 - 10.5 K/uL   RBC 4.59 4.22 - 5.81 MIL/uL   Hemoglobin 13.7 13.0 - 17.0 g/dL   HCT 42.2 39.0 - 52.0 %   MCV 91.9 78.0 - 100.0 fL   MCH 29.8 26.0 -  34.0 pg   MCHC 32.5 30.0 - 36.0 g/dL   RDW 18.6 (H) 11.5 - 15.5 %   Platelets 75 (L) 150 - 400 K/uL    Comment: REPEATED TO VERIFY SPECIMEN CHECKED FOR CLOTS PLATELET COUNT CONFIRMED BY SMEAR  Performed at [facility], [place] [address], [place], [place] [address]   Protime-INR     Status: None   Collection Time: 03/26/18  8:20 PM  Result Value Ref Range   Prothrombin Time 14.6 11.4 - 15.2 seconds   INR 1.15     Comment: Performed at [facility] [address], [place], [place] [address]  CBC     Status: Abnormal   Collection Time: 03/27/18  2:41 AM  Result Value Ref Range   WBC 1.7 (L) 4.0 - 10.5 K/uL   RBC 3.97 (L) 4.22 - 5.81 MIL/uL   Hemoglobin 11.8 (L) 13.0 - 17.0 g/dL   HCT 36.8 (L) 39.0 - 52.0 %   MCV 92.7 78.0 - 100.0 fL   MCH 29.7 26.0 - 34.0 pg   MCHC 32.1 30.0 - 36.0 g/dL   RDW 18.6 (H) 11.5 - 15.5 %   Platelets 58 (L) 150 - 400 K/uL    Comment: CONSISTENT WITH PREVIOUS RESULT Performed at [facility], [place] [address], [place], [place] [address]   Ammonia     Status: Abnormal   Collection Time: 03/27/18  2:41 AM  Result Value Ref Range   Ammonia 92 (H) 9 - 35 umol/L    Comment: Performed at [facility], [place] [address] [place], [place] [address]  APTT     Status: None   Collection Time: 03/27/18  2:41 AM  Result Value Ref Range   aPTT 34 24 - 36 seconds    Comment: Performed at [facility] [address], [place], [place] [address]    No results found.              Blood pressure (!) 157/98, pulse 73, temperature 98.9 F (37.2 C), temperature source Oral, resp. rate 18, height '5\' 6"'$  (1.676 m), weight 72.3 kg, SpO2 98 %.  Physical exam:   General--Hispanic male in no distress ENT--nonicteric no gross blood in the mouth Neck--supple Heart--regular rate and rhythm without murmurs or gallops Lungs--clear Abdomen--no gross ascites mild upper abdominal tenderness.  Good bowel sounds Psych--appears normal.  Speaks  English reasonably well and answers questions appropriately   Assessment: 1.  Upper GI bleed.  Patient just had EGD about 2 months ago.  He did not have very large esophageal varices but did have gastric varices.  He will need another EGD and agree with empiric PPI and octreotide IV 2.  Alcoholic cirrhosis with thrombocytopenia.  Patient unfortunately continues to drink MELD score 10.  This gives a estimated 6% 30-monthmortality 3.  History of surgery for perforated ulcer  Plan: 1.  Continue IV PPI and octreotide for now. 2.  Suggest empiric broad-spectrum antibiotic.  Has been shown to reduce the risk of SBP another infectious complications and cirrhotics with variceal bleeding and the broad-spectrum antibiotic should be sufficient 3.  We will proceed later today with EGD with possible banding if bleeding from esophageal varices.  Have discussed with patient and he seems to understand.  If it appears that gastric varices are causing the bleeding we may need to consider TIPS if the bleeding continues.   JNancy Fetter9/21/2019, 5:48 AM   This note was created using voice recognition software and minor errors may Have occurred unintentionally. Pager: [phone] no answer or after hours call [phone]

## 2018-03-27 NOTE — H&P (View-Only) (Signed)
[facility] CONSULT Reason for consult: Upper GI bleed Referring Physician: [facility] hospitalist.  PCP: None primary GI: Dr. Watt Ayers.  Michael Ayers is an 42 y.o. male.  HPI: He is a medical history significant for continued alcohol abuse.  He has continued to drink.He is from [place] and was transferred to [facility] 7/19 due to lack of GI coverage in [place].  During that admission he had EGD by Dr. Watt Ayers revealing minimal esophageal varices but somewhat prominent gastric varices.  Patient has unfortunately continued to drink.  He also has a history of ulcer disease.  He has a history of operation for peptic ulcer done at another location.  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  During his last admission his MELD score was 13.  Unfortunately he has continued to drink and when asked says he drinks too much..  He came back to the [facility] emergency room with hematemesis.  He is also having mild epigastric and right upper quadrant pain feels lightheaded.  His hemoglobin in the emergency room was 13 platelet count was in the 70s and WBC 2.0.  INR was normal at 1.1.  He has continued to vomit up bright red blood and has been started on IV Protonix and octreotide.  The patient understands English fairly well and speaks [place] fairly well.  He indicates that he feels better and has not had any further bleeding overnight.  He is currently being treated with IV octreotide and Protonix.  Past Medical History:  Diagnosis Date  . Alcohol abuse   . Duodenitis   . Esophageal varices ([place])   . Essential hypertension   . Perforated peptic ulcer ([employer])     Past Surgical History:  Procedure Laterality Date  . ESOPHAGOGASTRODUODENOSCOPY (EGD) WITH PROPOFOL N/A 01/16/2018   Procedure: ESOPHAGOGASTRODUODENOSCOPY (EGD) WITH PROPOFOL;  Surgeon: Clarene Essex, MD;  Location: [facility];  Service: Endoscopy;  Laterality: N/A;  . Perforated peptic ulcer      Family History  Problem Relation  Age of Onset  . Diabetes Mellitus II Mother     Social History:  reports that he has never smoked. He has never used smokeless tobacco. He reports that he drinks about 4.0 - 5.0 standard drinks of alcohol per week. He reports that he does not use drugs.  Allergies: No Known Allergies  Medications; Prior to Admission medications   Medication Sig Start Date End Date Taking? Authorizing Provider  nadolol (CORGARD) 20 MG tablet Take 1 tablet (20 mg total) by mouth daily. Patient not taking: Reported on 03/27/2018 01/16/18   Patrecia Pour, MD  pantoprazole (PROTONIX) 40 MG tablet Take 1 tablet (40 mg total) by mouth daily. Patient not taking: Reported on 03/27/2018 01/16/18   Patrecia Pour, MD   . folic acid  1 mg Oral Daily  . [START ON 03/28/2018] Influenza vac split quadrivalent PF  0.5 mL Intramuscular Tomorrow-1000  . LORazepam  0-4 mg Intravenous Q6H   Followed by  . [START ON 03/29/2018] LORazepam  0-4 mg Intravenous Q12H  . multivitamin with minerals  1 tablet Oral Daily  . nadolol  20 mg Oral Daily  . [START ON 03/30/2018] pantoprazole  40 mg Intravenous Q12H  . thiamine  100 mg Oral Daily   Or  . thiamine  100 mg Intravenous Daily   PRN Meds hydrALAZINE, LORazepam **OR** LORazepam, morphine injection, ondansetron **OR** ondansetron (ZOFRAN) IV, zolpidem Results for orders placed or performed during the hospital encounter of 03/26/18 (from the  past 48 hour(s))  Sample to Blood Bank     Status: None   Collection Time: 03/26/18  8:14 PM  Result Value Ref Range   Blood Bank Specimen SAMPLE AVAILABLE FOR TESTING    Sample Expiration      03/27/2018 Performed at [facility], [address], [place], [place] [address]   Urinalysis, Routine w reflex microscopic     Status: Abnormal   Collection Time: 03/26/18  8:16 PM  Result Value Ref Range   Color, Urine AMBER (A) YELLOW    Comment: BIOCHEMICALS MAY BE AFFECTED BY COLOR   APPearance CLEAR CLEAR   Specific Gravity, Urine  1.015 1.005 - 1.030   pH 6.0 5.0 - 8.0   Glucose, UA NEGATIVE NEGATIVE mg/dL   Hgb urine dipstick SMALL (A) NEGATIVE   Bilirubin Urine NEGATIVE NEGATIVE   Ketones, ur NEGATIVE NEGATIVE mg/dL   Protein, ur 100 (A) NEGATIVE mg/dL   Nitrite NEGATIVE NEGATIVE   Leukocytes, UA NEGATIVE NEGATIVE   RBC / HPF 0-5 0 - 5 RBC/hpf   WBC, UA 0-5 0 - 5 WBC/hpf   Bacteria, UA NONE SEEN NONE SEEN    Comment: Performed at [facility], [place] [address], [place], [place] [address]  Lipase, blood     Status: Abnormal   Collection Time: 03/26/18  8:20 PM  Result Value Ref Range   Lipase 71 (H) 11 - 51 U/L    Comment: Performed at [facility] [address], [place], [place] [address]  Comprehensive metabolic panel     Status: Abnormal   Collection Time: 03/26/18  8:20 PM  Result Value Ref Range   Sodium 141 135 - 145 mmol/L   Potassium 3.8 3.5 - 5.1 mmol/L   Chloride 106 98 - 111 mmol/L   CO2 23 22 - 32 mmol/L   Glucose, Bld 111 (H) 70 - 99 mg/dL   BUN 7 6 - 20 mg/dL   Creatinine, Ser 0.72 0.61 - 1.24 mg/dL   Calcium 8.9 8.9 - 10.3 mg/dL   Total Protein 8.1 6.5 - 8.1 g/dL   Albumin 3.5 3.5 - 5.0 g/dL   AST 148 (H) 15 - 41 U/L   ALT 55 (H) 0 - 44 U/L   Alkaline Phosphatase 105 38 - 126 U/L   Total Bilirubin 1.8 (H) 0.3 - 1.2 mg/dL   GFR calc non Af Amer >60 >60 mL/min   GFR calc Af Amer >60 >60 mL/min    Comment: (NOTE) The eGFR has been calculated using the CKD EPI equation. This calculation has not been validated in all clinical situations. eGFR's persistently <60 mL/min signify possible Chronic Kidney Disease.    Anion gap 12 5 - 15    Comment: Performed at [facility] [address], [place], [place] [address]  CBC     Status: Abnormal   Collection Time: 03/26/18  8:20 PM  Result Value Ref Range   WBC 2.0 (L) 4.0 - 10.5 K/uL   RBC 4.59 4.22 - 5.81 MIL/uL   Hemoglobin 13.7 13.0 - 17.0 g/dL   HCT 42.2 39.0 - 52.0 %   MCV 91.9 78.0 - 100.0 fL   MCH 29.8 26.0 -  34.0 pg   MCHC 32.5 30.0 - 36.0 g/dL   RDW 18.6 (H) 11.5 - 15.5 %   Platelets 75 (L) 150 - 400 K/uL    Comment: REPEATED TO VERIFY SPECIMEN CHECKED FOR CLOTS PLATELET COUNT CONFIRMED BY SMEAR  Performed at [facility], [place] [address], [place], [place] [address]   Protime-INR     Status: None   Collection Time: 03/26/18  8:20 PM  Result Value Ref Range   Prothrombin Time 14.6 11.4 - 15.2 seconds   INR 1.15     Comment: Performed at [facility] [address], [place], [place] [address]  CBC     Status: Abnormal   Collection Time: 03/27/18  2:41 AM  Result Value Ref Range   WBC 1.7 (L) 4.0 - 10.5 K/uL   RBC 3.97 (L) 4.22 - 5.81 MIL/uL   Hemoglobin 11.8 (L) 13.0 - 17.0 g/dL   HCT 36.8 (L) 39.0 - 52.0 %   MCV 92.7 78.0 - 100.0 fL   MCH 29.7 26.0 - 34.0 pg   MCHC 32.1 30.0 - 36.0 g/dL   RDW 18.6 (H) 11.5 - 15.5 %   Platelets 58 (L) 150 - 400 K/uL    Comment: CONSISTENT WITH PREVIOUS RESULT Performed at [facility], [place] [address], [place], [place] [address]   Ammonia     Status: Abnormal   Collection Time: 03/27/18  2:41 AM  Result Value Ref Range   Ammonia 92 (H) 9 - 35 umol/L    Comment: Performed at [facility], [place] [address] [place], [place] [address]  APTT     Status: None   Collection Time: 03/27/18  2:41 AM  Result Value Ref Range   aPTT 34 24 - 36 seconds    Comment: Performed at [facility] [address], [place], [place] [address]    No results found.              Blood pressure (!) 157/98, pulse 73, temperature 98.9 F (37.2 C), temperature source Oral, resp. rate 18, height '5\' 6"'$  (1.676 m), weight 72.3 kg, SpO2 98 %.  Physical exam:   General--Hispanic male in no distress ENT--nonicteric no gross blood in the mouth Neck--supple Heart--regular rate and rhythm without murmurs or gallops Lungs--clear Abdomen--no gross ascites mild upper abdominal tenderness.  Good bowel sounds Psych--appears normal.  Speaks  English reasonably well and answers questions appropriately   Assessment: 1.  Upper GI bleed.  Patient just had EGD about 2 months ago.  He did not have very large esophageal varices but did have gastric varices.  He will need another EGD and agree with empiric PPI and octreotide IV 2.  Alcoholic cirrhosis with thrombocytopenia.  Patient unfortunately continues to drink MELD score 10.  This gives a estimated 6% 80-monthmortality 3.  History of surgery for perforated ulcer  Plan: 1.  Continue IV PPI and octreotide for now. 2.  Suggest empiric broad-spectrum antibiotic.  Has been shown to reduce the risk of SBP another infectious complications and cirrhotics with variceal bleeding and the broad-spectrum antibiotic should be sufficient 3.  We will proceed later today with EGD with possible banding if bleeding from esophageal varices.  Have discussed with patient and he seems to understand.  If it appears that gastric varices are causing the bleeding we may need to consider TIPS if the bleeding continues.   JNancy Fetter9/21/2019, 5:48 AM   This note was created using voice recognition software and minor errors may Have occurred unintentionally. Pager: [phone] no answer or after hours call [phone]

## 2018-03-27 NOTE — ED Provider Notes (Signed)
[facility] EMERGENCY DEPARTMENT Provider Note   CSN: [id number] Arrival date & time: 03/26/18  1954     History   Chief Complaint Chief Complaint  Patient presents with  . Hematemesis    HPI Michael Ayers is a 42 y.o. male.  Patient presents to the emergency department for evaluation of abdominal pain with vomiting blood.  Patient reports previous history of alcoholism with upper GI bleed.  He reports that he was hospitalized a couple of months ago for the same thing.  Patient reports onset tonight.  He has had multiple episodes of vomiting blood and clots.     Past Medical History:  Diagnosis Date  . Alcohol abuse   . Duodenitis   . Esophageal varices ([facility])   . Essential hypertension   . Perforated peptic ulcer ([facility])     Patient Active Problem List   Diagnosis Date Noted  . Abnormal LFTs 01/15/2018  . Thrombocytopenia ([facility]) 01/15/2018  . Hematemesis 01/14/2018  . Alcohol abuse   . Esophageal varices ([facility])   . Duodenitis   . Essential hypertension     Past Surgical History:  Procedure Laterality Date  . ESOPHAGOGASTRODUODENOSCOPY (EGD) WITH PROPOFOL N/A 01/16/2018   Procedure: ESOPHAGOGASTRODUODENOSCOPY (EGD) WITH PROPOFOL;  Surgeon: Vida Rigger, MD;  [facility] END[facility]COPY;  Service: Endoscopy;  Laterality: N/A;  . Perforated peptic ulcer          Home Medications    Prior to Admission medications   Medication Sig Start Date End Date Taking? Authorizing Provider  nadolol (CORGARD) 20 MG tablet Take 1 tablet (20 mg total) by mouth daily. 01/16/18   Tyrone Nine, MD  pantoprazole (PROTONIX) 40 MG tablet Take 1 tablet (40 mg total) by mouth daily. 01/16/18   Tyrone Nine, MD    Family History Family History  Problem Relation Age of Onset  . Diabetes Mellitus II Mother     Social History Social History   Tobacco Use  . Smoking status: Never Smoker  . Smokeless tobacco: Never Used  Substance Use Topics  . Alcohol use: Yes   Alcohol/week: 4.0 - 5.0 standard drinks    Types: 4 - 5 Cans of beer per week  . Drug use: Never     Allergies   Patient has no known allergies.   Review of Systems Review of Systems  Gastrointestinal: Positive for abdominal pain, nausea and vomiting.  All other systems reviewed and are negative.    Physical Exam Updated Vital Signs BP (!) 163/109   Pulse 97   Temp 98.3 F (36.8 C)   Resp 17   SpO2 100%   Physical Exam  Constitutional: He is oriented to person, place, and time. He appears well-developed and well-nourished. No distress.  HENT:  Head: Normocephalic and atraumatic.  Right Ear: Hearing normal.  Left Ear: Hearing normal.  Nose: Nose normal.  Mouth/Throat: Oropharynx is clear and moist and mucous membranes are normal.  Eyes: Pupils are equal, round, and reactive to light. Conjunctivae and EOM are normal.  Neck: Normal range of motion. Neck supple.  Cardiovascular: Regular rhythm, S1 normal and S2 normal. Exam reveals no gallop and no friction rub.  No murmur heard. Pulmonary/Chest: Effort normal and breath sounds normal. No respiratory distress. He exhibits no tenderness.  Abdominal: Soft. Normal appearance and bowel sounds are normal. There is no hepatosplenomegaly. There is tenderness in the epigastric area. There is no rebound, no guarding, no tenderness at McBurney's point and negative Murphy's sign. No  hernia.  Musculoskeletal: Normal range of motion.  Neurological: He is alert and oriented to person, place, and time. He has normal strength. No cranial nerve deficit or sensory deficit. Coordination normal. GCS eye subscore is 4. GCS verbal subscore is 5. GCS motor subscore is 6.  Skin: Skin is warm and intact. No rash noted. He is diaphoretic. No cyanosis. There is pallor.  Psychiatric: He has a normal mood and affect. His speech is normal and behavior is normal. Thought content normal.  Nursing note and vitals reviewed.    ED Treatments / Results    Labs (all labs ordered are listed, but only abnormal results are displayed) Labs Reviewed  LIPASE, BLOOD - Abnormal; Notable for the following components:      Result Value   Lipase 71 (*)    All other components within normal limits  COMPREHENSIVE METABOLIC PANEL - Abnormal; Notable for the following components:   Glucose, Bld 111 (*)    AST 148 (*)    ALT 55 (*)    Total Bilirubin 1.8 (*)    All other components within normal limits  CBC - Abnormal; Notable for the following components:   WBC 2.0 (*)    RDW 18.6 (*)    Platelets 75 (*)    All other components within normal limits  URINALYSIS, ROUTINE W REFLEX MICROSCOPIC - Abnormal; Notable for the following components:   Color, Urine AMBER (*)    Hgb urine dipstick SMALL (*)    Protein, ur 100 (*)    All other components within normal limits  PROTIME-INR  SAMPLE TO BLOOD BANK    EKG None  Radiology No results found.  Procedures Procedures (including critical care time)  Medications Ordered in ED Medications  octreotide (SANDOSTATIN) 2 mcg/mL load via infusion 50 mcg (has no administration in time range)    And  octreotide (SANDOSTATIN) 500 mcg in sodium chloride 0.9 % 250 mL (2 mcg/mL) infusion (has no administration in time range)  pantoprazole (PROTONIX) 80 mg in sodium chloride 0.9 % 100 mL IVPB (has no administration in time range)  pantoprazole (PROTONIX) 80 mg in sodium chloride 0.9 % 250 mL (0.32 mg/mL) infusion (has no administration in time range)  pantoprazole (PROTONIX) injection 40 mg (has no administration in time range)  cefTRIAXone (ROCEPHIN) 1 g in sodium chloride 0.9 % 100 mL IVPB (1 g Intravenous New Bag/Given 03/27/18 0151)     Initial Impression / Assessment and Plan / ED Course  I have reviewed the triage vital signs and the nursing notes.  Pertinent labs & imaging results that were available during my care of the patient were reviewed by me and considered in my medical decision making (see  chart for details).     Patient presents to the emergency department for evaluation of hematemesis.  Patient has a history of upper GI bleed and esophageal varices.  He is an alcoholic.  He was hospitalized 2 months ago with upper GI bleed, but at that time EGD showed the varices but it was not felt that he had a variceal bleed.  He did not require any banding or intervention.  Today, however, he has had persistent vomiting and has been vomiting blood and clots.  He has not hypotensive and his hemoglobin is stable from previous.  Patient was initiated empirically on octreotide, Protonix, Rocephin.  Care plan was discussed with Dr. Randa EvensEdwards, on-call for gastroenterology.  He agrees with the plan, does not feel that the patient requires any other  urgent intervention.  Recommends admission and will see the patient in the morning to determine if he needs any other treatment plan.  At this point he does not require any blood products.  CRITICAL CARE Performed by: Gilda Crease   Total critical care time: 35 minutes  Critical care time was exclusive of separately billable procedures and treating other patients.  Critical care was necessary to treat or prevent imminent or life-threatening deterioration.  Critical care was time spent personally by me on the following activities: development of treatment plan with patient and/or surrogate as well as nursing, discussions with consultants, evaluation of patient's response to treatment, examination of patient, obtaining history from patient or surrogate, ordering and performing treatments and interventions, ordering and review of laboratory studies, ordering and review of radiographic studies, pulse oximetry and re-evaluation of patient's condition.   Final Clinical Impressions(s) / ED Diagnoses   Final diagnoses:  Hematemesis, presence of nausea not specified  Upper GI bleed    ED Discharge Orders    None       Gilda Crease,  MD 03/27/18 [phone]

## 2018-03-27 NOTE — Transfer of Care (Signed)
Immediate Anesthesia Transfer of Care Note  Patient: Michael Ayers  Procedure(s) Performed: ESOPHAGOGASTRODUODENOSCOPY (EGD) WITH PROPOFOL (N/A )  Patient Location: PACU  Anesthesia Type:MAC  Level of Consciousness: awake, alert , oriented and patient cooperative  Airway & Oxygen Therapy: Patient Spontanous Breathing and Patient connected to nasal cannula oxygen  Post-op Assessment: Report given to RN and Post -op Vital signs reviewed and stable  Post vital signs: Reviewed and stable  Last Vitals:  Vitals Value Taken Time  BP 128/71 03/27/2018 11:51 AM  Temp    Pulse 73 03/27/2018 11:51 AM  Resp 14 03/27/2018 11:51 AM  SpO2 96 % 03/27/2018 11:51 AM    Last Pain:  Vitals:   03/27/18 1151  TempSrc: Oral  PainSc: 0-No pain      Patients Stated Pain Goal: 0 (94/76/54 6503)  Complications: No apparent anesthesia complications

## 2018-03-27 NOTE — Anesthesia Preprocedure Evaluation (Addendum)
Anesthesia Evaluation  Patient identified by MRN, date of birth, ID band  Reviewed: Allergy & Precautions, Patient's Chart, lab work & pertinent test results  History of Anesthesia Complications Negative for: history of anesthetic complications  Airway Mallampati: II  TM Distance: >3 FB     Dental   Pulmonary neg pulmonary ROS,    breath sounds clear to auscultation       Cardiovascular hypertension, Pt. on home beta blockers and Pt. on medications  Rhythm:Regular Rate:Normal     Neuro/Psych negative neurological ROS  negative psych ROS   GI/Hepatic Neg liver ROS, PUD, (+) Cirrhosis   Esophageal Varices  substance abuse  alcohol use,   Endo/Other  negative endocrine ROS  Renal/GU negative Renal ROS  negative genitourinary   Musculoskeletal negative musculoskeletal ROS (+)   Abdominal   Peds  Hematology  (+) anemia ,  Thrombocytopenia Leukopenia    Anesthesia Other Findings   Reproductive/Obstetrics                            Anesthesia Physical Anesthesia Plan  ASA: III  Anesthesia Plan: MAC   Post-op Pain Management:    Induction: Intravenous  PONV Risk Score and Plan: 1 and Propofol infusion and Treatment may vary due to age or medical condition  Airway Management Planned: Nasal Cannula and Natural Airway  Additional Equipment: None  Intra-op Plan:   Post-operative Plan:   Informed Consent: I have reviewed the patients History and Physical, chart, labs and discussed the procedure including the risks, benefits and alternatives for the proposed anesthesia with the patient or authorized representative who has indicated his/her understanding and acceptance.   Dental advisory given  Plan Discussed with: CRNA and Anesthesiologist  Anesthesia Plan Comments:        Anesthesia Quick Evaluation

## 2018-03-28 LAB — GLUCOSE, CAPILLARY: GLUCOSE-CAPILLARY: 87 mg/dL (ref 70–99)

## 2018-03-28 MED ORDER — SODIUM CHLORIDE 0.9 % IV SOLN
2.0000 g | INTRAVENOUS | Status: DC
  Filled 2018-03-28: qty 20

## 2018-03-28 MED ORDER — SODIUM CHLORIDE 0.9 % IV SOLN
50.0000 ug/h | INTRAVENOUS | Status: DC
  Filled 2018-03-28: qty 1

## 2018-03-28 NOTE — Addendum Note (Signed)
Addendum  created 03/28/18 1040 by Brittnae Aschenbrenner Ann, CRNA   Charge Capture section accepted, Visit diagnoses modified    

## 2018-03-28 NOTE — Progress Notes (Signed)
[facility] PROGRESS NOTE Subjective The patient has not had any further gross bleeding. His octreotide infusion was stopped. EGD yesterday showed minimal esophageal varices large gastric varices. CT scan was obtained showing cirrhosis without masses.  Objective: Vital signs in last 24 hours: Temp:  [98.1 F (36.7 C)-98.9 F (37.2 C)] 98.4 F (36.9 C) (09/22 0400) Resp:  [13-29] 15 (09/22 0400) BP: (131-185)/(82-111) 131/82 (09/22 0400) SpO2:  [96 %-98 %] 98 % (09/22 0400) Last BM Date: 03/26/18  Intake/Output from previous day: 09/21 0701 - 09/22 0700 In: 4475.8 [P.O.:737; I.V.:3638.8; IV Piggyback:100] Out: 6250 [Urine:6250] Intake/Output this shift: No intake/output data recorded.  PE: General--alert nonicteric  Abdomen--nontender  Lab Results: Recent Labs    03/26/18 2020 03/27/18 0241 03/27/18 0747 03/27/18 1358 03/27/18 2007  WBC 2.0* 1.7* 1.8* 1.4* 1.5*  HGB 13.7 11.8* 11.5* 11.8* 11.9*  HCT 42.2 36.8* 35.3* 36.6* 35.9*  PLT 75* 58* 58* 53* 55*   BMET Recent Labs    03/26/18 2020 03/27/18 0747  NA 141 139  K 3.8 4.2  CL 106 105  CO2 23 23  CREATININE 0.72 0.67   LFT Recent Labs    03/26/18 2020 03/27/18 0747  PROT 8.1 7.3  AST 148* 110*  ALT 55* 45*  ALKPHOS 105 85  BILITOT 1.8* 2.3*   PT/INR Recent Labs    03/26/18 2020  LABPROT 14.6  INR 1.15   PANCREAS Recent Labs    03/26/18 2020  LIPASE 71*         Studies/Results: Ct Abdomen Pelvis W Wo Contrast  Result Date: 03/27/2018 CLINICAL DATA:  Cirrhosis, hematemesis and gastric varices, portal gastropathy and grade 1 esophageal varices by endoscopy. EXAM: CT ABDOMEN AND PELVIS WITHOUT AND WITH CONTRAST TECHNIQUE: Multidetector CT imaging of the abdomen and pelvis was performed following the standard protocol before and following the bolus administration of intravenous contrast. CONTRAST:  100mL ISOVUE-370 IOPAMIDOL (ISOVUE-370) INJECTION 76% COMPARISON:  None.  FINDINGS: Lower chest: Mild bibasilar atelectasis. Hepatobiliary: The liver demonstrates cirrhosis with nodular contour. The right lobe demonstrates some relative atrophy. No evidence of focal hepatic mass or biliary ductal dilatation. The gallbladder contains some small dependent calcified calculi. No evidence of gallbladder inflammation or distention. Pancreas: Unremarkable. No pancreatic ductal dilatation or surrounding inflammatory changes. Spleen: The spleen is moderately enlarged. No evidence of splenic mass or abnormal perfusion. The splenic artery and vein are patent. Adrenals/Urinary Tract: Adrenal glands are unremarkable. Kidneys are normal, without renal calculi, focal lesion, or hydronephrosis. Bladder is distended. Stomach/Bowel: Bowel shows mild dilatation of left-sided jejunal loops likely representing some degree ileus. No overt obstruction, significant bowel inflammation or lesion is identified. No free intraperitoneal air is detected. Vascular/Lymphatic: No enlarged lymph nodes. Arterial vascular evaluation shows normal patency of the abdominal aorta and visceral branches. Hepatic arterial supply is via of traditional branch vessel anatomy of the celiac axis. The superior mesenteric artery, bilateral renal arteries and inferior mesenteric arteries show normal patency. Venous evaluation demonstrates prominent gastric varices projecting into the gastric lumen along the cardia and proximal fundus. Protruding gastric varices measure up to 12 mm in estimated maximal diameter. Prominent portosystemic shunting is noted with short gastric branches off of the splenic vein supplying the gastric varices and emptying via a patent and tortuous gastro-renal shunt draining into the left renal vein. This shunt reaches a maximal caliber of approximately 15-16 mm. No thrombus identified in the varicosities or shunt. There are some small distal esophageal varices noted. No other venous shunts are  identified.  Mesenteric veins, splenic vein and portal vein show normal patency without evidence of thrombus. Intrahepatic portal veins and hepatic veins are well demonstrated and show normal patency and branching anatomy. The IVC, renal veins, iliac veins and common femoral veins demonstrate normal patency. Reproductive: Prostate is unremarkable. Other: No hernias identified. There is a trace amount of ascites around the liver. Musculoskeletal: No acute or significant osseous findings. IMPRESSION: 1. Evidence of cirrhosis and portal hypertension with prominent portosystemic shunting primarily via branches of the splenic vein supplying prominent gastric varices and emptying via a large gastro-renal shunt into the left renal vein. Gastric varices are the presumed source of upper GI bleeding given their protrusion into the gastric lumen at the level of the cardia and fundus and a minimal component of distal esophageal varices by comparison. Based on CT anatomy, the patient would potentially be a candidate for BRTO to treat bleeding gastric varices. A TIPS procedure would not likely be needed given the predominance of gastric varices. 2. Trace ascites surrounding the liver. 3. Moderate splenomegaly. 4. Cholelithiasis without evidence of cholecystitis or biliary obstruction. 5. Cirrhosis of the liver without evidence of hepatic masses. 6. Probable component of mild ileus involving the jejunum without significant bowel obstruction. Electronically Signed   By: Irish Lack M.D.   On: 03/27/2018 16:49    Medications: I have reviewed the patient's current medications.  Assessment:   1. Alcoholic cirrhosis with variceal bleed. Patient also has thrombocytopenia. His octreotide has been stopped on after only one day on therapy. This is a bit early but he seems stable and not bleeding. I would be a bit hesitant to send him home since I suspect he will go back and drink again. Long discussion with him about the need to stop  drinking.   Plan: I would favor watching him for at least one more day off of octreotide. If he really believes he may need TIPS procedure. He should be offered alcohol rehab. I have told him bluntly that if he continues to drink he will likely die from this. Hopefully close follow-up can be arranged to monitor his hemoglobin, thrombocytopenia and leukopenia. This note was created using voice recognition software. Minor errors may Have occurred unintentionally.  Pager: [phone] If no answer or after hours call [phone]

## 2018-03-28 NOTE — Progress Notes (Signed)
PROGRESS NOTE  Michael Ayers UJW:[id number] DOB: January 07, 1976 DOA: 03/26/2018 PCP: System, Provider Not In  HPI/Brief Narrative  Michael Ayers is a 42 y.o. year old male with medical history significant for alcohol abuse, cirrhosis, esophageal varices and prior admission on 01/2018 for bleeding ulcer who presented on 03/26/2018 with several days of hematemesis.  Denies any hematuria, melena or bright red blood per rectum.  Reports occasionally using NSAIDs.  And was found to have nonbleeding gastric and esophageal varices with no signs of bleeding on EGD on 03/27/2018..  Subjective No acute complaints this morning. Wants to go home  Assessment/Plan:  #Hematemesis, stable.  EGD on 9/21 showed nonbleeding gastric/esophageal varices and normal duodenum.  Has been maintained on empiric octreotide, ceftriaxone and Protonix.Discontinued on 9/22 given no recurrent hematemesis after discussion with GI consultants. Will need to monitor to ensure no further  Appreciate GI recommendations  #Alcoholic cirrhosis with thrombocytopenia.  Patient continues to be a heavy drinker.  #Chronic anemia, stable.  Likely related to baseline hemoglobin 11.  Initially 13 on admission likely hemoconcentration.  Has remained stable at 11 on cbc trend.   #Continue alcohol abuse.  Reports last drink day prior to admission.  Monitor on CIWA protocol.  #Hypertension,at goal.  On home nadolol, PRN hydralazine IV  #Abnormal LFTs, improving.  Recent hepatitis panel negative on previous admission.  Avoid hepatotoxic agents  #History of duodenitis and chronic gastritis, stable.  Surgery for peptic ulcer disease in the past.  Currently on Protonix drip  #Chronic Pancytopenia, stable. Very likely related to cirrhosis. Platelets remaining in the 50s and hemoglobin also stable. Leukopenia with WBC of 1.8  Code Status: Full code  Family Communication: No family at bedside  Disposition Plan: need to monitor hgb and make sure no  bleeding now that off octreotide per GI recs, likely discharge on 9/23   Consultants:  Treatment Team:   Carman ChingEdwards, James, MD    Procedures:  EGD-9/21: Grade 1 esophageal varices, gastric varices without bleeding, portal hypertensive gastropathy  Antimicrobials: Anti-infectives (From admission, onward)   Start     Dose/Rate Route Frequency Ordered Stop   03/28/18 0915  cefTRIAXone (ROCEPHIN) 2 g in sodium chloride 0.9 % 100 mL IVPB  Status:  Discontinued     2 g 200 mL/hr over 30 Minutes Intravenous Every 24 hours 03/28/18 0904 03/28/18 0936   03/28/18 0100  cefTRIAXone (ROCEPHIN) 2 g in sodium chloride 0.9 % 100 mL IVPB  Status:  Discontinued     2 g 200 mL/hr over 30 Minutes Intravenous Every 24 hours 03/27/18 0615 03/28/18 0858   03/27/18 0130  cefTRIAXone (ROCEPHIN) 1 g in sodium chloride 0.9 % 100 mL IVPB     1 g 200 mL/hr over 30 Minutes Intravenous  Once 03/27/18 0127 03/27/18 0234        Cultures:  none  Telemetry:none  DVT prophylaxis:  none   Objective: Vitals:   03/27/18 2032 03/27/18 2046 03/28/18 0000 03/28/18 0400  BP: (!) 159/97 (!) 143/85 (!) 148/87 131/82  Pulse:      Resp: 18 20 14 15   Temp: 98.9 F (37.2 C)  98.1 F (36.7 C) 98.4 F (36.9 C)  TempSrc: Oral  Oral Oral  SpO2: 97% 98% 96% 98%  Weight:      Height:        Intake/Output Summary (Last 24 hours) at 03/28/2018 1114 Last data filed at 03/28/2018 0530 Gross per 24 hour  Intake 3875.77 ml  Output 5650 ml  Net -  1774.23 ml   Filed Weights   03/27/18 0346  Weight: 72.3 kg    Exam:  Constitutional:normal appearing male Eyes: EOMI, anicteric, normal conjunctivae ENMT: Oropharynx with moist mucous membranes, normal dentition Cardiovascular: RRR no MRGs, with no peripheral edema Respiratory: Normal respiratory effort, clear breath sounds  Abdomen: Soft,non-tender, Skin: No rash ulcers, or lesions. Without skin tenting  Neurologic: Grossly no focal neuro  deficit. Psychiatric:Appropriate affect, and mood. Mental status AAOx3  Data Reviewed: CBC: Recent Labs  Lab 03/26/18 2020 03/27/18 0241 03/27/18 0747 03/27/18 1358 03/27/18 2007  WBC 2.0* 1.7* 1.8* 1.4* 1.5*  HGB 13.7 11.8* 11.5* 11.8* 11.9*  HCT 42.2 36.8* 35.3* 36.6* 35.9*  MCV 91.9 92.7 92.2 92.2 91.8  PLT 75* 58* 58* 53* 55*   Basic Metabolic Panel: Recent Labs  Lab 03/26/18 2020 03/27/18 0747  NA 141 139  K 3.8 4.2  CL 106 105  CO2 23 23  GLUCOSE 111* 109*  BUN 7 9  CREATININE 0.72 0.67  CALCIUM 8.9 8.3*   GFR: Estimated Creatinine Clearance: 108.5 mL/min (by C-G formula based on SCr of 0.67 mg/dL). Liver Function Tests: Recent Labs  Lab 03/26/18 2020 03/27/18 0747  AST 148* 110*  ALT 55* 45*  ALKPHOS 105 85  BILITOT 1.8* 2.3*  PROT 8.1 7.3  ALBUMIN 3.5 3.1*   Recent Labs  Lab 03/26/18 2020  LIPASE 71*   Recent Labs  Lab 03/27/18 0241  AMMONIA 92*   Coagulation Profile: Recent Labs  Lab 03/26/18 2020  INR 1.15   Cardiac Enzymes: No results for input(s): CKTOTAL, CKMB, CKMBINDEX, TROPONINI in the last 168 hours. BNP (last 3 results) No results for input(s): PROBNP in the last 8760 hours. HbA1C: No results for input(s): HGBA1C in the last 72 hours. CBG: Recent Labs  Lab 03/27/18 0603 03/28/18 0637  GLUCAP 92 87   Lipid Profile: No results for input(s): CHOL, HDL, LDLCALC, TRIG, CHOLHDL, LDLDIRECT in the last 72 hours. Thyroid Function Tests: No results for input(s): TSH, T4TOTAL, FREET4, T3FREE, THYROIDAB in the last 72 hours. Anemia Panel: No results for input(s): VITAMINB12, FOLATE, FERRITIN, TIBC, IRON, RETICCTPCT in the last 72 hours. Urine analysis:    Component Value Date/Time   COLORURINE AMBER (A) 03/26/2018 2016   APPEARANCEUR CLEAR 03/26/2018 2016   LABSPEC 1.015 03/26/2018 2016   PHURINE 6.0 03/26/2018 2016   GLUCOSEU NEGATIVE 03/26/2018 2016   HGBUR SMALL (A) 03/26/2018 2016   BILIRUBINUR NEGATIVE 03/26/2018  2016   KETONESUR NEGATIVE 03/26/2018 2016   PROTEINUR 100 (A) 03/26/2018 2016   UROBILINOGEN 1.0 03/27/2011 1714   NITRITE NEGATIVE 03/26/2018 2016   LEUKOCYTESUR NEGATIVE 03/26/2018 2016   Sepsis Labs: @LABRCNTIP (procalcitonin:4,lacticidven:4)  )No results found for this or any previous visit (from the past 240 hour(s)).    Studies: Ct Abdomen Pelvis W Wo Contrast  Result Date: 03/27/2018 CLINICAL DATA:  Cirrhosis, hematemesis and gastric varices, portal gastropathy and grade 1 esophageal varices by endoscopy. EXAM: CT ABDOMEN AND PELVIS WITHOUT AND WITH CONTRAST TECHNIQUE: Multidetector CT imaging of the abdomen and pelvis was performed following the standard protocol before and following the bolus administration of intravenous contrast. CONTRAST:  ISOVUE-370 IOPAMIDOL (ISOVUE-370) INJECTION 76% COMPARISON:  None. FINDINGS: Lower chest: Mild bibasilar atelectasis. Hepatobiliary: The liver demonstrates cirrhosis with nodular contour. The right lobe demonstrates some relative atrophy. No evidence of focal hepatic mass or biliary ductal dilatation. The gallbladder contains some small dependent calcified calculi. No evidence of gallbladder inflammation or distention. Pancreas: Unremarkable. No pancreatic ductal  dilatation or surrounding inflammatory changes. Spleen: The spleen is moderately enlarged. No evidence of splenic mass or abnormal perfusion. The splenic artery and vein are patent. Adrenals/Urinary Tract: Adrenal glands are unremarkable. Kidneys are normal, without renal calculi, focal lesion, or hydronephrosis. Bladder is distended. Stomach/Bowel: Bowel shows mild dilatation of left-sided jejunal loops likely representing some degree ileus. No overt obstruction, significant bowel inflammation or lesion is identified. No free intraperitoneal air is detected. Vascular/Lymphatic: No enlarged lymph nodes. Arterial vascular evaluation shows normal patency of the abdominal aorta and visceral  branches. Hepatic arterial supply is via of traditional branch vessel anatomy of the celiac axis. The superior mesenteric artery, bilateral renal arteries and inferior mesenteric arteries show normal patency. Venous evaluation demonstrates prominent gastric varices projecting into the gastric lumen along the cardia and proximal fundus. Protruding gastric varices measure up to 12 mm in estimated maximal diameter. Prominent portosystemic shunting is noted with short gastric branches off of the splenic vein supplying the gastric varices and emptying via a patent and tortuous gastro-renal shunt draining into the left renal vein. This shunt reaches a maximal caliber of approximately 15-16 mm. No thrombus identified in the varicosities or shunt. There are some small distal esophageal varices noted. No other venous shunts are identified. Mesenteric veins, splenic vein and portal vein show normal patency without evidence of thrombus. Intrahepatic portal veins and hepatic veins are well demonstrated and show normal patency and branching anatomy. The IVC, renal veins, iliac veins and common femoral veins demonstrate normal patency. Reproductive: Prostate is unremarkable. Other: No hernias identified. There is a trace amount of ascites around the liver. Musculoskeletal: No acute or significant osseous findings. IMPRESSION: 1. Evidence of cirrhosis and portal hypertension with prominent portosystemic shunting primarily via branches of the splenic vein supplying prominent gastric varices and emptying via a large gastro-renal shunt into the left renal vein. Gastric varices are the presumed source of upper GI bleeding given their protrusion into the gastric lumen at the level of the cardia and fundus and a minimal component of distal esophageal varices by comparison. Based on CT anatomy, the patient would potentially be a candidate for BRTO to treat bleeding gastric varices. A TIPS procedure would not likely be needed given the  predominance of gastric varices. 2. Trace ascites surrounding the liver. 3. Moderate splenomegaly. 4. Cholelithiasis without evidence of cholecystitis or biliary obstruction. 5. Cirrhosis of the liver without evidence of hepatic masses. 6. Probable component of mild ileus involving the jejunum without significant bowel obstruction. Electronically Signed   By: Irish Lack M.D.   On: 03/27/2018 16:49    Scheduled Meds: . feeding supplement  1 Container Oral TID BM  . folic acid  1 mg Oral Daily  . Influenza vac split quadrivalent PF  0.5 mL Intramuscular Tomorrow-1000  . LORazepam  0-4 mg Intravenous Q6H   Followed by  . [START ON 03/29/2018] LORazepam  0-4 mg Intravenous Q12H  . multivitamin with minerals  1 tablet Oral Daily  . nadolol  20 mg Oral Daily  . [START ON 03/30/2018] pantoprazole  40 mg Intravenous Q12H  . thiamine  100 mg Oral Daily   Or  . thiamine  100 mg Intravenous Daily    Continuous Infusions: . pantoprozole (PROTONIX) infusion 8 mg/hr (03/28/18 1027)     LOS: 0 days     Laverna Peace, MD [facility] Pager [phone]  If 7PM-7AM, please contact night-coverage [email or web address] Password [facility] 03/28/2018, 11:14 AM

## 2018-03-29 ENCOUNTER — Encounter (HOSPITAL_COMMUNITY): Payer: Self-pay | Admitting: Gastroenterology

## 2018-03-29 DIAGNOSIS — R945 Abnormal results of liver function studies: Secondary | ICD-10-CM

## 2018-03-29 DIAGNOSIS — K2980 Duodenitis without bleeding: Secondary | ICD-10-CM

## 2018-03-29 DIAGNOSIS — F1010 Alcohol abuse, uncomplicated: Secondary | ICD-10-CM

## 2018-03-29 DIAGNOSIS — I8501 Esophageal varices with bleeding: Secondary | ICD-10-CM

## 2018-03-29 DIAGNOSIS — D696 Thrombocytopenia, unspecified: Secondary | ICD-10-CM

## 2018-03-29 DIAGNOSIS — I864 Gastric varices: Secondary | ICD-10-CM

## 2018-03-29 DIAGNOSIS — K920 Hematemesis: Secondary | ICD-10-CM

## 2018-03-29 DIAGNOSIS — K922 Gastrointestinal hemorrhage, unspecified: Secondary | ICD-10-CM

## 2018-03-29 DIAGNOSIS — D649 Anemia, unspecified: Secondary | ICD-10-CM | POA: Diagnosis present

## 2018-03-29 LAB — CBC
HEMATOCRIT: 37.8 % — AB (ref 39.0–52.0)
HEMOGLOBIN: 12.8 g/dL — AB (ref 13.0–17.0)
MCH: 30.6 pg (ref 26.0–34.0)
MCHC: 33.9 g/dL (ref 30.0–36.0)
MCV: 90.4 fL (ref 78.0–100.0)
Platelets: 60 10*3/uL — ABNORMAL LOW (ref 150–400)
RBC: 4.18 MIL/uL — AB (ref 4.22–5.81)
RDW: 17.2 % — ABNORMAL HIGH (ref 11.5–15.5)
WBC: 1.8 10*3/uL — AB (ref 4.0–10.5)

## 2018-03-29 LAB — GLUCOSE, CAPILLARY: GLUCOSE-CAPILLARY: 108 mg/dL — AB (ref 70–99)

## 2018-03-29 MED ORDER — TRAMADOL HCL 50 MG PO TABS
50.0000 mg | ORAL_TABLET | Freq: Once | ORAL | Status: AC
  Administered 2018-03-29: 50 mg via ORAL
  Filled 2018-03-29: qty 1

## 2018-03-29 MED ORDER — SODIUM CHLORIDE 0.9 % IV SOLN
INTRAVENOUS | Status: DC
  Administered 2018-03-29: 20:00:00 via INTRAVENOUS

## 2018-03-29 NOTE — Consult Note (Signed)
Chief Complaint: Patient was seen in consultation today for balloon-occluded retrograde transvenous obliteration.  Referring Physician(s): Dr. Alessandra Bevels   Supervising Physician: Jacqulynn Cadet  Patient Status: [facility] - In-pt  History of Present Illness: Michael Ayers is a 42 y.o. male with a past medical history significant for ETOH abuse with cirrhosis, HTN, perforated gastric ulcer and esophageal varices. Patient presented to [facility] ED on 03/27/18 with complaints of abdominal pain and hematemesis - he had previously been admitted for evaluation of same in July 2019. He was evaluated by GI during previous admission and EGD was performed which showed grade I esophageal varices, portal hypertensive gastropathy and chronic gastritis - he was d/ced to home on 01/16/18 with instructions to establish PCP, follow up with GI and to continue to abstain from alcohol.  Unfortunately, patient reports he continued to drink and did not follow up with GI or PCP. He was started on IV protonix + octreotide during this admission and plan for repeat EGD with possible banding was made with GI which was performed on 9/21 and showed small esophageal varices but large gastric varices. Gastric varices were also seen on CT abdomen/pelvis from 9/21. Request was made to IR for possible BRTO.  Patient reports he is feeling better, still with some abdominal pain occasionally but he has been tolerating some food and drinks well. He denies further hematemesis. Patient seen by Dr. Laurence Ferrari today who explained indication, risks, benefits and follow-up with patient today via video interpreter, he agrees to proceed.   Past Medical History:  Diagnosis Date  . Alcohol abuse   . Duodenitis   . Esophageal varices ([place])   . Essential hypertension   . Perforated peptic ulcer ([place])     Past Surgical History:  Procedure Laterality Date  . ESOPHAGOGASTRODUODENOSCOPY (EGD) WITH PROPOFOL N/A 01/16/2018   Procedure:  ESOPHAGOGASTRODUODENOSCOPY (EGD) WITH PROPOFOL;  Surgeon: Clarene Essex, MD;  Location: [place];  Service: Endoscopy;  Laterality: N/A;  . Perforated peptic ulcer      Allergies: Patient has no known allergies.  Medications: Prior to Admission medications   Medication Sig Start Date End Date Taking? Authorizing Provider  nadolol (CORGARD) 20 MG tablet Take 1 tablet (20 mg total) by mouth daily. Patient not taking: Reported on 03/27/2018 01/16/18   Patrecia Pour, MD  pantoprazole (PROTONIX) 40 MG tablet Take 1 tablet (40 mg total) by mouth daily. Patient not taking: Reported on 03/27/2018 01/16/18   Patrecia Pour, MD     Family History  Problem Relation Age of Onset  . Diabetes Mellitus II Mother     Social History   Socioeconomic History  . Marital status: Single    Spouse name: Not on file  . Number of children: Not on file  . Years of education: Not on file  . Highest education level: Not on file  Occupational History  . Not on file  Social Needs  . Financial resource strain: Not on file  . Food insecurity:    Worry: Not on file    Inability: Not on file  . Transportation needs:    Medical: Not on file    Non-medical: Not on file  Tobacco Use  . Smoking status: Never Smoker  . Smokeless tobacco: Never Used  Substance and Sexual Activity  . Alcohol use: Yes    Alcohol/week: 4.0 - 5.0 standard drinks    Types: 4 - 5 Cans of beer per week  . Drug use: Never  . Sexual activity: Yes  Lifestyle  . Physical activity:    Days per week: Not on file    Minutes per session: Not on file  . Stress: Not on file  Relationships  . Social connections:    Talks on phone: Not on file    Gets together: Not on file    Attends religious service: Not on file    Active member of club or organization: Not on file    Attends meetings of clubs or organizations: Not on file    Relationship status: Not on file  Other Topics Concern  . Not on file  Social History Narrative  . Not  on file     Review of Systems: A 12 point ROS discussed and pertinent positives are indicated in the HPI above.  All other systems are negative.  Review of Systems  Constitutional: Negative for chills and fever.  Respiratory: Negative for shortness of breath.   Cardiovascular: Negative for chest pain.  Gastrointestinal: Positive for abdominal pain (occasional; improved). Negative for diarrhea, nausea and vomiting.  Neurological: Negative for dizziness.  Psychiatric/Behavioral: Negative for confusion.    Vital Signs: BP (!) 142/89   Pulse 67   Temp 98.7 F (37.1 C) (Oral)   Resp 17   Ht _0  (1.676 m)   Wt 159 lb 6.3 oz (72.3 kg)   SpO2 97%   BMI 25.73 kg/m   Physical Exam  Constitutional: He is oriented to person, place, and time. No distress.  Cardiovascular: Normal rate, regular rhythm and normal heart sounds.  Pulmonary/Chest: Effort normal and breath sounds normal.  Abdominal: Soft. Bowel sounds are normal. He exhibits no distension. There is no tenderness.  Neurological: He is alert and oriented to person, place, and time.  Skin: Skin is warm and dry. He is not diaphoretic.  Psychiatric: He has a normal mood and affect. His behavior is normal. Judgment normal.  Nursing note and vitals reviewed.    MD Evaluation Airway: WNL Heart: WNL Abdomen: WNL Chest/ Lungs: WNL ASA  Classification: 3 Mallampati/Airway Score: Two   Imaging: Ct Abdomen Pelvis W Wo Contrast  Result Date: 03/27/2018 CLINICAL DATA:  Cirrhosis, hematemesis and gastric varices, portal gastropathy and grade 1 esophageal varices by endoscopy. EXAM: CT ABDOMEN AND PELVIS WITHOUT AND WITH CONTRAST TECHNIQUE: Multidetector CT imaging of the abdomen and pelvis was performed following the standard protocol before and following the bolus administration of intravenous contrast. CONTRAST:  150m ISOVUE-370 IOPAMIDOL (ISOVUE-370) INJECTION 76% COMPARISON:  None. FINDINGS: Lower chest: Mild bibasilar  atelectasis. Hepatobiliary: The liver demonstrates cirrhosis with nodular contour. The right lobe demonstrates some relative atrophy. No evidence of focal hepatic mass or biliary ductal dilatation. The gallbladder contains some small dependent calcified calculi. No evidence of gallbladder inflammation or distention. Pancreas: Unremarkable. No pancreatic ductal dilatation or surrounding inflammatory changes. Spleen: The spleen is moderately enlarged. No evidence of splenic mass or abnormal perfusion. The splenic artery and vein are patent. Adrenals/Urinary Tract: Adrenal glands are unremarkable. Kidneys are normal, without renal calculi, focal lesion, or hydronephrosis. Bladder is distended. Stomach/Bowel: Bowel shows mild dilatation of left-sided jejunal loops likely representing some degree ileus. No overt obstruction, significant bowel inflammation or lesion is identified. No free intraperitoneal air is detected. Vascular/Lymphatic: No enlarged lymph nodes. Arterial vascular evaluation shows normal patency of the abdominal aorta and visceral branches. Hepatic arterial supply is via of traditional branch vessel anatomy of the celiac axis. The superior mesenteric artery, bilateral renal arteries and inferior mesenteric arteries show normal patency. Venous evaluation  demonstrates prominent gastric varices projecting into the gastric lumen along the cardia and proximal fundus. Protruding gastric varices measure up to 12 mm in estimated maximal diameter. Prominent portosystemic shunting is noted with short gastric branches off of the splenic vein supplying the gastric varices and emptying via a patent and tortuous gastro-renal shunt draining into the left renal vein. This shunt reaches a maximal caliber of approximately 15-16 mm. No thrombus identified in the varicosities or shunt. There are some small distal esophageal varices noted. No other venous shunts are identified. Mesenteric veins, splenic vein and portal vein  show normal patency without evidence of thrombus. Intrahepatic portal veins and hepatic veins are well demonstrated and show normal patency and branching anatomy. The IVC, renal veins, iliac veins and common femoral veins demonstrate normal patency. Reproductive: Prostate is unremarkable. Other: No hernias identified. There is a trace amount of ascites around the liver. Musculoskeletal: No acute or significant osseous findings. IMPRESSION: 1. Evidence of cirrhosis and portal hypertension with prominent portosystemic shunting primarily via branches of the splenic vein supplying prominent gastric varices and emptying via a large gastro-renal shunt into the left renal vein. Gastric varices are the presumed source of upper GI bleeding given their protrusion into the gastric lumen at the level of the cardia and fundus and a minimal component of distal esophageal varices by comparison. Based on CT anatomy, the patient would potentially be a candidate for BRTO to treat bleeding gastric varices. A TIPS procedure would not likely be needed given the predominance of gastric varices. 2. Trace ascites surrounding the liver. 3. Moderate splenomegaly. 4. Cholelithiasis without evidence of cholecystitis or biliary obstruction. 5. Cirrhosis of the liver without evidence of hepatic masses. 6. Probable component of mild ileus involving the jejunum without significant bowel obstruction. Electronically Signed   By: Aletta Edouard M.D.   On: 03/27/2018 16:49    Labs:  CBC: Recent Labs    03/27/18 0747 03/27/18 1358 03/27/18 2007 03/29/18 0401  WBC 1.8* 1.4* 1.5* 1.8*  HGB 11.5* 11.8* 11.9* 12.8*  HCT 35.3* 36.6* 35.9* 37.8*  PLT 58* 53* 55* 60*    COAGS: Recent Labs    01/14/18 2348 03/26/18 2020 03/27/18 0241  INR 1.24 1.15  --   APTT 36  --  34    BMP: Recent Labs    01/15/18 0354 03/26/18 2020 03/27/18 0747  NA 141 141 139  K 4.7 3.8 4.2  CL 104 106 105  CO2 18* 23 23  GLUCOSE 61* 111* 109*    BUN _0 CALCIUM 8.8* 8.9 8.3*  CREATININE 1.04 0.72 0.67  GFRNONAA >60 >60 >60  GFRAA >60 >60 >60    LIVER FUNCTION TESTS: Recent Labs    01/15/18 0354 01/16/18 0452 03/26/18 2020 03/27/18 0747  BILITOT 2.8* 3.4* 1.8* 2.3*  AST 119* 85* 148* 110*  ALT 44 36 55* 45*  ALKPHOS 108 97 105 85  PROT 7.7 7.0 8.1 7.3  ALBUMIN 3.6 3.0* 3.5 3.1*    TUMOR MARKERS: No results for input(s): AFPTM, CEA, CA199, CHROMGRNA in the last 8760 hours.  Assessment and Plan:  Patient with history of significant ETOH abuse with previously known esophageal varices seen on EGD July 2019 during admission for hematemesis who presents to [facility] ED for same on 9/21. He underwent repeat EGD on 9/21 which again showed esophageal varices and new large gastric varices. IR was consulted for BRTO and patient was seen today in conjunction with Dr. Laurence Ferrari who explained procedure, risks,  benefits and expected follow-up to patient via video interpreter. Patient agrees to proceed with procedure tomorrow under moderate sedation in IR.   He is afebrile, WBC 1.9 which is baseline for him, H/H 12.8/37.8, AST 110, ALT 45, Alk phos 85. He does not take any blood thinners and he will be NPO after midnight tonight.  Risks and benefits of TIPS, BRTO and/or additional variceal embolization were discussed with the patient and/or the patient's family including, but not limited to, infection, bleeding, damage to adjacent structures, worsening hepatic and/or cardiac function, non-target embolization and death.   This interventional procedure involves the use of X-rays and because of the nature of the planned procedure, it is possible that we will have prolonged use of X-ray fluoroscopy.  Potential radiation risks to you include (but are not limited to) the following: - A slightly elevated risk for cancer  several years later in life. This risk is typically less than 0.5% percent. This risk is low in comparison to the normal  incidence of human cancer, which is 33% for women and 50% for men according to the [place]. - Radiation induced injury can include skin redness, resembling a rash, tissue breakdown / ulcers and hair loss (which can be temporary or permanent).   The likelihood of either of these occurring depends on the difficulty of the procedure and whether you are sensitive to radiation due to previous procedures, disease, or genetic conditions.   IF your procedure requires a prolonged use of radiation, you will be notified and given written instructions for further action.  It is your responsibility to monitor the irradiated area for the 2 weeks following the procedure and to notify your physician if you are concerned that you have suffered a radiation induced injury.    All of the patient's questions were answered, patient is agreeable to proceed.  Consent signed and in chart.  Thank you for this interesting consult.  I greatly enjoyed meeting Geovanie Winnett and look forward to participating in their care.  A copy of this report was sent to the requesting provider on this date.  Electronically Signed: Joaquim Nam, PA-C 03/29/2018, 4:26 PM   I spent a total of 40 Minutes  in face to face in clinical consultation, greater than 50% of which was counseling/coordinating care for BRTO.

## 2018-03-29 NOTE — Progress Notes (Signed)
[facility]  Michael ChurchManuel Ayers 42 y.o. 08-07-1975  CC: Hematemesis   Subjective: No further bleeding episodes.  Patient denies abdominal pain.  He wants to go home.  ROS : Negative for chest pain and shortness of breath.   Objective: Vital signs in last 24 hours: Vitals:   03/29/18 0427 03/29/18 0953  BP: (!) 141/91 (!) 163/93  Pulse: 60 67  Resp: 19 14  Temp: 98.2 F (36.8 C) 98.7 F (37.1 C)  SpO2: 97%     Physical Exam:  General.  Alert/oriented x3.  Not in acute distress HEART : RRR , no murmur Abdomen soft, nontender, nondistended, bowel sounds present.  Mild icteric. Lab Results: Recent Labs    03/26/18 2020 03/27/18 0747  NA 141 139  K 3.8 4.2  CL 106 105  CO2 23 23  GLUCOSE 111* 109*  BUN 7 9  CREATININE 0.72 0.67  CALCIUM 8.9 8.3*   Recent Labs    03/26/18 2020 03/27/18 0747  AST 148* 110*  ALT 55* 45*  ALKPHOS 105 85  BILITOT 1.8* 2.3*  PROT 8.1 7.3  ALBUMIN 3.5 3.1*   Recent Labs    03/27/18 2007 03/29/18 0401  WBC 1.5* 1.8*  HGB 11.9* 12.8*  HCT 35.9* 37.8*  MCV 91.8 90.4  PLT 55* 60*   Recent Labs    03/26/18 2020  LABPROT 14.6  INR 1.15      Assessment/Plan: -Recurrent upper GI bleed.  EGD in July and again few days ago showed small esophageal varices but large gastric varices.  CT abdomen pelvis with IV contrast also showed large gastric varices  -Alcoholic cirrhosis.  Complicated by esophageal and gastric varices, thrombocytopenia. -Abnormal LFTs.  Probably from alcohol use and cirrhosis.  Recommendations ------------------------- -Patient admitted twice in the last 2 months for upper GI bleed.  He remains at high risk for recurrent upper GI bleed and possibly destabilizing bleeding because of large gastric varices.  I have discussed with interventional radiology Dr. Deanne CofferHassell for evaluation for possible BRTO.  -I have discussed with the patient as well but he would like to go home as soon as  possible.  I think is reasonable to keep him in the hospital for additional day pending further evaluation. -Discussed with Dr. Caleb PoppNettey as well. -Absolute alcohol abstinence was again discussed with the patient. -GI will follow.  Kathi DerParag Jalani Cullifer MD, FACP 03/29/2018, 9:56 AM  Contact #  [phone]

## 2018-03-29 NOTE — Progress Notes (Signed)
PROGRESS NOTE  Ashlin Hidalgo ZOX:[id number] DOB: Dec 05, 1975 DOA: 03/26/2018 PCP: System, Provider Not In  HPI/Brief Narrative  Michael Ayers is a 42 y.o. year old male with medical history significant for alcohol abuse, cirrhosis, esophageal varices and prior admission on 01/2018 for bleeding ulcer who presented on 03/26/2018 with several days of hematemesis.  Denies any hematuria, melena or bright red blood per rectum.  Reports occasionally using NSAIDs.  And was found to have nonbleeding gastric and esophageal varices with no signs of bleeding on EGD on 03/27/2018..  Subjective No acute complaints this morning. Wants to go home but understands seriousness of his illness  Assessment/Plan:  #Hematemesis secondary to varices, stable.  EGD on 9/21 showed nonbleeding gastric/esophageal varices and normal duodenum.  Had been on octreotide, ceftriaxone and Protonix initially on admission.Discontinued on 9/22 given no recurrent hematemesis after discussion with GI consultants. Will need to monitor to ensure no further bleed  #Large gastric and esophageal varices. Related to alcoholic cirrhosis. Not currently bleeding but very high risk for recurrent bleed. Appreciate GI consultants (Dr. Levora Angel) who has consulted IR to evaluate for BRTO this admission  #Alcoholic cirrhosis with thrombocytopenia.  Patient continues to be a heavy drinker. Platelets stable at 60  #Chronic anemia, stable.  Baseline hemoglobin 11.  Initially 13 on admission likely hemoconcentration.    #Continue alcohol abuse.  Reports last drink day prior to admission.  Monitor on CIWA protocol.  #Hypertension,at goal.  On home nadolol, PRN hydralazine IV  #Abnormal LFTs, improving.  Recent hepatitis panel negative on previous admission.  Avoid hepatotoxic agents  #History of duodenitis and chronic gastritis, stable.  Surgery for peptic ulcer disease in the past.  Currently on Protonix drip  #Chronic Pancytopenia, stable. Very  likely related to cirrhosis. Platelets remaining in the 50s and hemoglobin also stable. Leukopenia with WBC of 1.8  Code Status: Full code  Family Communication: No family at bedside  Disposition Plan: need to monitor hgb and make sure no bleeding now that off octreotide per GI recs, likely discharge on 9/23   Consultants:   GI, IR  Procedures:  EGD-9/21: Grade 1 esophageal varices, gastric varices without bleeding, portal hypertensive gastropathy  Antimicrobials: Anti-infectives (From admission, onward)   Start     Dose/Rate Route Frequency Ordered Stop   03/28/18 0915  cefTRIAXone (ROCEPHIN) 2 g in sodium chloride 0.9 % 100 mL IVPB  Status:  Discontinued     2 g 200 mL/hr over 30 Minutes Intravenous Every 24 hours 03/28/18 0904 03/28/18 0936   03/28/18 0100  cefTRIAXone (ROCEPHIN) 2 g in sodium chloride 0.9 % 100 mL IVPB  Status:  Discontinued     2 g 200 mL/hr over 30 Minutes Intravenous Every 24 hours 03/27/18 0615 03/28/18 0858   03/27/18 0130  cefTRIAXone (ROCEPHIN) 1 g in sodium chloride 0.9 % 100 mL IVPB     1 g 200 mL/hr over 30 Minutes Intravenous  Once 03/27/18 0127 03/27/18 0234        Cultures:  none  Telemetry:none  DVT prophylaxis:  none   Objective: Vitals:   03/28/18 2323 03/29/18 0427 03/29/18 0953 03/29/18 0956  BP: 140/84 (!) 141/91 (!) 163/93 (!) 153/92  Pulse:  60 67   Resp: 18 19 14    Temp:  98.2 F (36.8 C) 98.7 F (37.1 C)   TempSrc:  Oral Oral   SpO2:  97%    Weight:      Height:        Intake/Output Summary (  Last 24 hours) at 03/29/2018 1019 Last data filed at 03/29/2018 0400 Gross per 24 hour  Intake 1174.3 ml  Output 1600 ml  Net -425.7 ml   Filed Weights   03/27/18 0346  Weight: 72.3 kg    Exam:  Constitutional:normal appearing male Eyes: EOMI, anicteric, normal conjunctivae ENMT: Oropharynx with moist mucous membranes, normal dentition Cardiovascular: RRR no MRGs, with no peripheral edema Respiratory: Normal  respiratory effort, clear breath sounds  Abdomen: Soft,non-tender, Skin: No rash ulcers, or lesions. Without skin tenting  Neurologic: Grossly no focal neuro deficit. Psychiatric:Appropriate affect, and mood. Mental status AAOx3  Data Reviewed: CBC: Recent Labs  Lab 03/27/18 0241 03/27/18 0747 03/27/18 1358 03/27/18 2007 03/29/18 0401  WBC 1.7* 1.8* 1.4* 1.5* 1.8*  HGB 11.8* 11.5* 11.8* 11.9* 12.8*  HCT 36.8* 35.3* 36.6* 35.9* 37.8*  MCV 92.7 92.2 92.2 91.8 90.4  PLT 58* 58* 53* 55* 60*   Basic Metabolic Panel: Recent Labs  Lab 03/26/18 2020 03/27/18 0747  NA 141 139  K 3.8 4.2  CL 106 105  CO2 23 23  GLUCOSE 111* 109*  BUN 7 9  CREATININE 0.72 0.67  CALCIUM 8.9 8.3*   GFR: Estimated Creatinine Clearance: 108.5 mL/min (by C-G formula based on SCr of 0.67 mg/dL). Liver Function Tests: Recent Labs  Lab 03/26/18 2020 03/27/18 0747  AST 148* 110*  ALT 55* 45*  ALKPHOS 105 85  BILITOT 1.8* 2.3*  PROT 8.1 7.3  ALBUMIN 3.5 3.1*   Recent Labs  Lab 03/26/18 2020  LIPASE 71*   Recent Labs  Lab 03/27/18 0241  AMMONIA 92*   Coagulation Profile: Recent Labs  Lab 03/26/18 2020  INR 1.15   Cardiac Enzymes: No results for input(s): CKTOTAL, CKMB, CKMBINDEX, TROPONINI in the last 168 hours. BNP (last 3 results) No results for input(s): PROBNP in the last 8760 hours. HbA1C: No results for input(s): HGBA1C in the last 72 hours. CBG: Recent Labs  Lab 03/27/18 0603 03/28/18 0637 03/29/18 0720  GLUCAP 92 87 108*   Lipid Profile: No results for input(s): CHOL, HDL, LDLCALC, TRIG, CHOLHDL, LDLDIRECT in the last 72 hours. Thyroid Function Tests: No results for input(s): TSH, T4TOTAL, FREET4, T3FREE, THYROIDAB in the last 72 hours. Anemia Panel: No results for input(s): VITAMINB12, FOLATE, FERRITIN, TIBC, IRON, RETICCTPCT in the last 72 hours. Urine analysis:    Component Value Date/Time   COLORURINE AMBER (A) 03/26/2018 2016   APPEARANCEUR CLEAR  03/26/2018 2016   LABSPEC 1.015 03/26/2018 2016   PHURINE 6.0 03/26/2018 2016   GLUCOSEU NEGATIVE 03/26/2018 2016   HGBUR SMALL (A) 03/26/2018 2016   BILIRUBINUR NEGATIVE 03/26/2018 2016   KETONESUR NEGATIVE 03/26/2018 2016   PROTEINUR 100 (A) 03/26/2018 2016   UROBILINOGEN 1.0 03/27/2011 1714   NITRITE NEGATIVE 03/26/2018 2016   LEUKOCYTESUR NEGATIVE 03/26/2018 2016   Sepsis Labs: @LABRCNTIP (procalcitonin:4,lacticidven:4)  )No results found for this or any previous visit (from the past 240 hour(s)).    Studies: No results found.  Scheduled Meds: . feeding supplement  1 Container Oral TID BM  . folic acid  1 mg Oral Daily  . Influenza vac split quadrivalent PF  0.5 mL Intramuscular Tomorrow-1000  . LORazepam  0-4 mg Intravenous Q12H  . multivitamin with minerals  1 tablet Oral Daily  . nadolol  20 mg Oral Daily  . [START ON 03/30/2018] pantoprazole  40 mg Intravenous Q12H  . thiamine  100 mg Oral Daily    Continuous Infusions: . pantoprozole (PROTONIX) infusion 8 mg/hr (03/29/18  0981)     LOS: 1 day     Laverna Peace, MD [facility] Pager [phone]  If 7PM-7AM, please contact night-coverage [email or web address] Password [facility] 03/29/2018, 10:19 AM

## 2018-03-29 NOTE — Progress Notes (Signed)
I received a [place] to provide Advanced Directive information for the patient. I visited the patient and gave an overview of the AD and provided AD copies in [place] and [place].I provided spiritual support by giving words of encouragement and prayer. I shared that the Chaplain is available for additional support as needed or requested.    03/29/18 1100  Clinical Encounter Type  Visited With Patient  Visit Type Spiritual support  Referral From Nurse  Consult/Referral To Chaplain  Spiritual Encounters  Spiritual Needs Literature  Stress Factors  Patient Stress Factors None identified    Chaplain Dr Melvyn NovasMichael Alexanderia Gorby

## 2018-03-30 ENCOUNTER — Encounter (HOSPITAL_COMMUNITY): Payer: Self-pay | Admitting: Interventional Radiology

## 2018-03-30 ENCOUNTER — Inpatient Hospital Stay (HOSPITAL_COMMUNITY): Payer: Self-pay

## 2018-03-30 DIAGNOSIS — I10 Essential (primary) hypertension: Secondary | ICD-10-CM

## 2018-03-30 DIAGNOSIS — D649 Anemia, unspecified: Secondary | ICD-10-CM

## 2018-03-30 HISTORY — PX: IR ANGIOGRAM SELECTIVE EACH ADDITIONAL VESSEL: IMG667

## 2018-03-30 HISTORY — PX: IR VENOGRAM RENAL UNI LEFT: IMG680

## 2018-03-30 HISTORY — PX: IR US GUIDE VASC ACCESS RIGHT: IMG2390

## 2018-03-30 HISTORY — PX: IR VENOGRAM ADRENAL UNI LEFT: IMG683

## 2018-03-30 LAB — CBC
HCT: 39.4 % (ref 39.0–52.0)
Hemoglobin: 13 g/dL (ref 13.0–17.0)
MCH: 30.7 pg (ref 26.0–34.0)
MCHC: 33 g/dL (ref 30.0–36.0)
MCV: 93.1 fL (ref 78.0–100.0)
PLATELETS: 75 10*3/uL — AB (ref 150–400)
RBC: 4.23 MIL/uL (ref 4.22–5.81)
RDW: 17.9 % — AB (ref 11.5–15.5)
WBC: 2 10*3/uL — AB (ref 4.0–10.5)

## 2018-03-30 LAB — COMPREHENSIVE METABOLIC PANEL
ALT: 111 U/L — ABNORMAL HIGH (ref 0–44)
ANION GAP: 10 (ref 5–15)
AST: 240 U/L — ABNORMAL HIGH (ref 15–41)
Albumin: 3.1 g/dL — ABNORMAL LOW (ref 3.5–5.0)
Alkaline Phosphatase: 100 U/L (ref 38–126)
BUN: 9 mg/dL (ref 6–20)
CHLORIDE: 100 mmol/L (ref 98–111)
CO2: 25 mmol/L (ref 22–32)
Calcium: 9 mg/dL (ref 8.9–10.3)
Creatinine, Ser: 0.81 mg/dL (ref 0.61–1.24)
GFR calc Af Amer: >60 mL/min (ref >60)
Glucose, Bld: 94 mg/dL (ref 70–99)
POTASSIUM: 3.6 mmol/L (ref 3.5–5.1)
Sodium: 135 mmol/L (ref 135–145)
Total Bilirubin: 4 mg/dL — ABNORMAL HIGH (ref 0.3–1.2)
Total Protein: 7.6 g/dL (ref 6.5–8.1)

## 2018-03-30 LAB — PROTIME-INR
INR: 1.24
PROTHROMBIN TIME: 15.5 s — AB (ref 11.4–15.2)

## 2018-03-30 LAB — ECHOCARDIOGRAM COMPLETE
Height: 66 in
WEIGHTICAEL: 2550.28 [oz_av]

## 2018-03-30 LAB — APTT: APTT: 35 s (ref 24–36)

## 2018-03-30 LAB — GLUCOSE, CAPILLARY: Glucose-Capillary: 88 mg/dL (ref 70–99)

## 2018-03-30 MED ORDER — ADULT MULTIVITAMIN W/MINERALS CH: 1.0000 | ORAL_TABLET | Freq: Every day | ORAL | 0 refills | Status: DC

## 2018-03-30 MED ORDER — MIDAZOLAM HCL 2 MG/2ML IJ SOLN
INTRAMUSCULAR | Status: AC | PRN
  Administered 2018-03-30: 1 mg via INTRAVENOUS
  Administered 2018-03-30: 2 mg via INTRAVENOUS

## 2018-03-30 MED ORDER — FOLIC ACID 1 MG PO TABS: 1.0000 mg | ORAL_TABLET | Freq: Every day | ORAL | 0 refills | Status: DC

## 2018-03-30 MED ORDER — FENTANYL CITRATE (PF) 100 MCG/2ML IJ SOLN
INTRAMUSCULAR | Status: AC
  Filled 2018-03-30: qty 6

## 2018-03-30 MED ORDER — FENTANYL CITRATE (PF) 100 MCG/2ML IJ SOLN
INTRAMUSCULAR | Status: AC | PRN
  Administered 2018-03-30: 25 ug via INTRAVENOUS
  Administered 2018-03-30: 50 ug via INTRAVENOUS
  Administered 2018-03-30: 25 ug via INTRAVENOUS

## 2018-03-30 MED ORDER — MIDAZOLAM HCL 2 MG/2ML IJ SOLN
INTRAMUSCULAR | Status: AC
  Filled 2018-03-30: qty 6

## 2018-03-30 MED ORDER — NADOLOL 20 MG PO TABS: 20.0000 mg | ORAL_TABLET | Freq: Every day | ORAL | 0 refills | Status: DC

## 2018-03-30 MED ORDER — IOPAMIDOL (ISOVUE-300) INJECTION 61%
INTRAVENOUS | Status: AC
  Filled 2018-03-30: qty 100

## 2018-03-30 MED ORDER — LIDOCAINE HCL (PF) 1 % IJ SOLN
INTRAMUSCULAR | Status: AC | PRN
  Administered 2018-03-30: 10 mL

## 2018-03-30 MED ORDER — LIDOCAINE HCL 1 % IJ SOLN
INTRAMUSCULAR | Status: AC
  Filled 2018-03-30: qty 20

## 2018-03-30 MED ORDER — MIDAZOLAM HCL 2 MG/2ML IJ SOLN
INTRAMUSCULAR | Status: AC
  Filled 2018-03-30: qty 2

## 2018-03-30 NOTE — Progress Notes (Signed)
  Echocardiogram 2D Echocardiogram has been performed.  Janalyn HarderWest, Donnesha Karg R 03/30/2018, 2:12 PM

## 2018-03-30 NOTE — Procedures (Signed)
Interventional Radiology Procedure Note  Procedure:  1.) Unsuccessful BRTO with moderate sedation 2.) Left renal, left adrenal and gastrorenal shunt venography all performed confirming anatomy and flow dynamics  Complications: None  Estimated Blood Loss: None  Recommendations: - Will need to reschedule with general anesthesia for TIPS with combined BATO/BRTO (antegrade and retrograde balloon occlusion).   - Depending on anesthesia's schedule, this may need to be done a an out-pt  Signed,  Sterling BigHeath K. Cherita Hebel, MD

## 2018-03-30 NOTE — Discharge Summary (Signed)
Discharge Summary  Michael Ayers ZOX:[id number] DOB: 07/20/1975  PCP: System, Provider Not In  Admit date: 03/26/2018 Discharge date: 03/30/2018   Time spent: < 25 minutes  Admitted From: home Disposition:  home  Recommendations for Outpatient Follow-up:  1. Follow up with PCP in 1-2 weeks 2. Outpatient BRTO and TIPS to be scheduled by IR in a few weeks 3. F/u GI clinic in 4 weeks 4. Continue daily nadolol and PPI     Discharge Diagnoses:  Active Hospital Problems   Diagnosis Date Noted  . Hematemesis 01/14/2018  . Anemia 03/29/2018  . Abdominal pain 03/27/2018  . Thrombocytopenia (HCC) 01/15/2018  . Abnormal LFTs 01/15/2018  . Essential hypertension   . Esophageal varices (HCC)   . Alcohol abuse   . Duodenitis     Resolved Hospital Problems  No resolved problems to display.    Discharge Condition: Stable   CODE STATUS:FULL    History of present illness:  Michael Ayers is a 42 y.o. year old male with medical history significant for alcohol abuse, cirrhosis, esophageal varices and prior admission on 01/2018 for bleeding ulcer who presented on 03/26/2018 with several days of hematemesis.  Denies any hematuria, melena or bright red blood per rectum.  Reports occasionally using NSAIDs.  And was found to have nonbleeding gastric and esophageal varices with no signs of bleeding on EGD on 03/27/2018. Remaining hospital course addressed in problem based format below:   Hospital Course:   Hematemesis, presumed secondary to varices.  EGD confirmed gastric and esophageal varices which were nonbleeding on evaluation.  Empiric octreotide and ceftriaxone were discontinued after 24 hours.  Protonix infusion was continued for 48 hours.  Given patient's variceal anatomy IR was consulted but unable to perform PRT O on admission as patient will need moderate sedation with anesthesia.  Plan to pursue as outpatient given stability of patient's hemoglobin and symptoms throughout hospital  stay.  Will continue nadolol and daily PPI on discharge with GI and IR follow-up as an outpatient.  Large gastric and esophageal varices.  Related to alcoholic cirrhosis.  Hemoglobin remained stable patient had no repeat bleeding episodes during admission.  Alcoholic cirrhosis with thrombocytopenia.  Platelet remained stable did not require transfusion during admission  Chronic anemia, stable.  Baseline hemoglobin of 11 (initially 13 on admission likely due to hemoconcentration) otherwise remained stable.  Chronic alcohol abuse.  Patient continues to drink very heavily was encouraged to consider cessation.  Monitored on CIWA protocol during admission.  Abnormal LFTs, stable.  Recent hepatitis panel negative on previous admission 2 months prior.  Avoid hepatotoxic agents.  Likely some element of alcoholic hepatitis.  Chronic pancytopenia, stable.  Likely related to cirrhosis.  Platelets and hemoglobin remained stable.  Leukopenia also at prior levels.   Consultations:  IR, gastroenterology  Procedures/Studies: EGD  Discharge Exam: BP 135/74 (BP Location: Right Arm)   Pulse (!) 54   Temp 98.4 F (36.9 C) (Oral)   Resp 14   Ht 5\' 6"  (1.676 m)   Wt 72.3 kg   SpO2 100%   BMI 25.73 kg/m   General: Lying in bed, no distress Eyes: EOMI, anicteric ENT: Oral Mucosa clear and moist Cardiovascular: regular rate and rhythm, no murmurs, rubs or gallops, no edema, Respiratory: Normal respiratory effort, lungs clear to auscultation bilaterally Abdomen: soft, non-distended, non-tender, normal bowel sounds Skin: No Rash Neurologic: Grossly no focal neuro deficit.Mental status AAOx3, speech normal, Psychiatric:Appropriate affect, and mood   Discharge Instructions You were cared for by a  hospitalist during your hospital stay. If you have any questions about your discharge medications or the care you received while you were in the hospital after you are discharged, you can call the unit and  asked to speak with the hospitalist on call if the hospitalist that took care of you is not available. Once you are discharged, your primary care physician will handle any further medical issues. Please note that NO REFILLS for any discharge medications will be authorized once you are discharged, as it is imperative that you return to your primary care physician (or establish a relationship with a primary care physician if you do not have one) for your aftercare needs so that they can reassess your need for medications and monitor your lab values.  Discharge Instructions    Diet - low sodium heart healthy   Complete by:  As directed    Increase activity slowly   Complete by:  As directed      Allergies as of 03/30/2018   No Known Allergies     Medication List    TAKE these medications   folic acid 1 MG tablet Commonly known as:  FOLVITE Take 1 tablet (1 mg total) by mouth daily. Start taking on:  03/31/2018   multivitamin with minerals Tabs tablet Take 1 tablet by mouth daily. Start taking on:  03/31/2018   nadolol 20 MG tablet Commonly known as:  CORGARD Take 1 tablet (20 mg total) by mouth daily.   pantoprazole 40 MG tablet Commonly known as:  PROTONIX Take 1 tablet (40 mg total) by mouth daily.      No Known Allergies Follow-up Information    Brahmbhatt, Parag, MD. Schedule an appointment as soon as possible for a visit in 4 week(s).   Specialty:  Gastroenterology Why:  Follow-up for cirrhosis, gastric varices, abnormal LFTs Contact information: [address] [place] [address] [phone]            The results of significant diagnostics from this hospitalization (including imaging, microbiology, ancillary and laboratory) are listed below for reference.    Significant Diagnostic Studies: Ct Abdomen Pelvis W Wo Contrast  Result Date: 03/27/2018 CLINICAL DATA:  Cirrhosis, hematemesis and gastric varices, portal gastropathy and grade 1 esophageal  varices by endoscopy. EXAM: CT ABDOMEN AND PELVIS WITHOUT AND WITH CONTRAST TECHNIQUE: Multidetector CT imaging of the abdomen and pelvis was performed following the standard protocol before and following the bolus administration of intravenous contrast. CONTRAST:  ISOVUE-370 IOPAMIDOL (ISOVUE-370) INJECTION 76% COMPARISON:  None. FINDINGS: Lower chest: Mild bibasilar atelectasis. Hepatobiliary: The liver demonstrates cirrhosis with nodular contour. The right lobe demonstrates some relative atrophy. No evidence of focal hepatic mass or biliary ductal dilatation. The gallbladder contains some small dependent calcified calculi. No evidence of gallbladder inflammation or distention. Pancreas: Unremarkable. No pancreatic ductal dilatation or surrounding inflammatory changes. Spleen: The spleen is moderately enlarged. No evidence of splenic mass or abnormal perfusion. The splenic artery and vein are patent. Adrenals/Urinary Tract: Adrenal glands are unremarkable. Kidneys are normal, without renal calculi, focal lesion, or hydronephrosis. Bladder is distended. Stomach/Bowel: Bowel shows mild dilatation of left-sided jejunal loops likely representing some degree ileus. No overt obstruction, significant bowel inflammation or lesion is identified. No free intraperitoneal air is detected. Vascular/Lymphatic: No enlarged lymph nodes. Arterial vascular evaluation shows normal patency of the abdominal aorta and visceral branches. Hepatic arterial supply is via of traditional branch vessel anatomy of the celiac axis. The superior mesenteric artery, bilateral renal arteries  and inferior mesenteric arteries show normal patency. Venous evaluation demonstrates prominent gastric varices projecting into the gastric lumen along the cardia and proximal fundus. Protruding gastric varices measure up to 12 mm in estimated maximal diameter. Prominent portosystemic shunting is noted with short gastric branches off of the splenic vein  supplying the gastric varices and emptying via a patent and tortuous gastro-renal shunt draining into the left renal vein. This shunt reaches a maximal caliber of approximately 15-16 mm. No thrombus identified in the varicosities or shunt. There are some small distal esophageal varices noted. No other venous shunts are identified. Mesenteric veins, splenic vein and portal vein show normal patency without evidence of thrombus. Intrahepatic portal veins and hepatic veins are well demonstrated and show normal patency and branching anatomy. The IVC, renal veins, iliac veins and common femoral veins demonstrate normal patency. Reproductive: Prostate is unremarkable. Other: No hernias identified. There is a trace amount of ascites around the liver. Musculoskeletal: No acute or significant osseous findings. IMPRESSION: 1. Evidence of cirrhosis and portal hypertension with prominent portosystemic shunting primarily via branches of the splenic vein supplying prominent gastric varices and emptying via a large gastro-renal shunt into the left renal vein. Gastric varices are the presumed source of upper GI bleeding given their protrusion into the gastric lumen at the level of the cardia and fundus and a minimal component of distal esophageal varices by comparison. Based on CT anatomy, the patient would potentially be a candidate for BRTO to treat bleeding gastric varices. A TIPS procedure would not likely be needed given the predominance of gastric varices. 2. Trace ascites surrounding the liver. 3. Moderate splenomegaly. 4. Cholelithiasis without evidence of cholecystitis or biliary obstruction. 5. Cirrhosis of the liver without evidence of hepatic masses. 6. Probable component of mild ileus involving the jejunum without significant bowel obstruction. Electronically Signed   By: Irish Lack M.D.   On: 03/27/2018 16:49   Ir Angiogram Selective Each Additional Vessel  Result Date: 03/30/2018 CLINICAL DATA:  42 year old  male with alcoholic cirrhosis complicated by bleeding gastric varices in the presence of a large gastro renal shunt. He presents today for an attempted balloon occluded retrograde transvenous obliteration under moderate sedation. EXAM: 1. Right internal jugular venous access with ultrasound guidance 2. Left renal vein catheterization and left renal venogram 3. Catheterization of the common trunk of the gastro renal shunt and left adrenal vein with venogram 4. Catheterization of the left adrenal vein with venogram 5. Catheterization of the gastro renal shunt with balloon occluded venogram MEDICATIONS: None. ANESTHESIA/SEDATION: A total of 3 mg Versed and 100 mcg fentanyl were administered intravenously. Patient's level of consciousness and vital signs were monitored continuously by radiology nursing under my direct supervision. The total moderate sedated time was 80 minutes. CONTRAST:  90 cc Omnipaque 350 FLUOROSCOPY TIME:  Fluoroscopy Time: 38 minutes 6 seconds (556 mGy). COMPLICATIONS: None immediate. PROCEDURE: Informed written consent was obtained from the patient after a thorough discussion of the procedural risks, benefits and alternatives. All questions were addressed. Maximal Sterile Barrier Technique was utilized including caps, mask, sterile gowns, sterile gloves, sterile drape, hand hygiene and skin antiseptic. A timeout was performed prior to the initiation of the procedure. The right internal jugular vein was interrogated with ultrasound and found to be widely patent. An image was obtained and stored for the medical record. Local anesthesia was attained by infiltration with 1% lidocaine. A small dermatotomy was made. Under real-time sonographic guidance, the vessel was punctured with a 21 gauge micropuncture  needle. Using standard technique, the initial micro needle was exchanged over a 0.018 micro wire for a transitional 4 [place] micro sheath. The micro sheath was then exchanged over a 0.035 wire for a  10 French dilator and the skin tract was dilated. A 10 French TIPS sheath was then advanced over the wire and positioned in the inferior vena cava. A Kumpe the catheter was advanced through the sheath and used to select the left renal vein. The catheter was advanced deep into the left renal vein and a left renal venogram was performed. The left renal vein is widely patent. Inflow artifact from the common trunk of the left adrenal vein and gastro renal shunt is visualized. There is mild incompetent flow down the left gonadal vein. A superstiff Amplatz wire was advanced into the renal vein. The 10 French sheath was successfully advanced into the proximal renal vein. The 5 French catheter was removed and the wire was left in place. The 5 French catheter was reintroduced through the sheath coaxially next 2 the Amplatz wire. The catheter was used to successfully catheterize the common trunk of the gastro renal shunt and left adrenal vein. Contrast injection was performed confirming the anatomy. The catheter was then advanced and a second venogram was performed. This reveals that the catheter tip is within the left adrenal vein. The catheter was brought back into the common trunk and utilizing a Glidewire, the catheter was successfully navigated into the gastro renal shunt. A venogram was performed confirming this location. The superstiff Amplatz wire was then advanced into the gastro renal shunt. The 5 French catheter was removed. A 9 French Merci balloon occlusion catheter was advanced over the wire and positioned in the gastro renal shunt. The occlusion balloon was inflated and a balloon occluded retrograde venogram was performed of the gastric shunt and gastric varices. The initial segment of the gastro renal shunt is capacious and tortuous. Intravenous contrast could not be refluxed into the portal venous system. Therefore, a carbon dioxide balloon occluded venogram was performed. This demonstrates that the afferent  flow of the portosystemic shunt is via the left gastric vein. A high-flow Renegade microcatheter was then advanced coaxially through the [facility] occlusion balloon and into the gastro renal shunt. A significant amount of time was then spent trying to manipulate the catheter deeper into the gastro renal shunt. Unfortunately, the guidewire would only coil in a circular fashion in the initial capacious portion of the gastro renal shunt. A total of 3 different guidewires were tried in an effort to navigate the anatomy. Multiple different tip shapes were also employed. Ultimately, no further progress could be made. At this time, typically we would proceed with a TIPS access for combined antegrade and retrograde balloon occluded transvenous obliteration. Unfortunately, we are unable to proceed with tips at this time given that the patient is only sedated. We will have to reschedule with general anesthesia. The catheters and wires were removed. Hemostasis was attained by manual pressure. IMPRESSION: 1. Ultimately unsuccessful balloon occluded retrograde transvenous obliteration of gastric varices secondary to inability to navigate the highly tortuous and capacious gastro renal shunt. 2. Multi selective and balloon occluded venography demonstrates that the if air in flow from the portal venous system is via the left gastric vein. The patient will likely require a TIPS with combined antegrade and retrograde balloon occluded transvenous obliteration of the gastric varices. We will reschedule for TIPS with BATO/BRTO under general anesthesia in the near future. Signed, Michael Big, MD, RPVI  [facility] Radiology Electronically Signed   By: Michael Ayers M.D.   On: 03/30/2018 13:07   Ir Angiogram Selective Each Additional Vessel  Result Date: 03/30/2018 CLINICAL DATA:  42 year old male with alcoholic cirrhosis complicated by bleeding gastric varices in the presence of  a large gastro renal shunt. He presents today for an attempted balloon occluded retrograde transvenous obliteration under moderate sedation. EXAM: 1. Right internal jugular venous access with ultrasound guidance 2. Left renal vein catheterization and left renal venogram 3. Catheterization of the common trunk of the gastro renal shunt and left adrenal vein with venogram 4. Catheterization of the left adrenal vein with venogram 5. Catheterization of the gastro renal shunt with balloon occluded venogram MEDICATIONS: None. ANESTHESIA/SEDATION: A total of 3 mg Versed and 100 mcg fentanyl were administered intravenously. Patient's level of consciousness and vital signs were monitored continuously by radiology nursing under my direct supervision. The total moderate sedated time was 80 minutes. CONTRAST:  90 cc Omnipaque 350 FLUOROSCOPY TIME:  Fluoroscopy Time: 38 minutes 6 seconds (556 mGy). COMPLICATIONS: None immediate. PROCEDURE: Informed written consent was obtained from the patient after a thorough discussion of the procedural risks, benefits and alternatives. All questions were addressed. Maximal Sterile Barrier Technique was utilized including caps, mask, sterile gowns, sterile gloves, sterile drape, hand hygiene and skin antiseptic. A timeout was performed prior to the initiation of the procedure. The right internal jugular vein was interrogated with ultrasound and found to be widely patent. An image was obtained and stored for the medical record. Local anesthesia was attained by infiltration with 1% lidocaine. A small dermatotomy was made. Under real-time sonographic guidance, the vessel was punctured with a 21 gauge micropuncture needle. Using standard technique, the initial micro needle was exchanged over a 0.018 micro wire for a transitional 4 [place] micro sheath. The micro sheath was then exchanged over a 0.035 wire for a 10 French dilator and the skin tract was dilated. A 10 French TIPS sheath was then  advanced over the wire and positioned in the inferior vena cava. A Kumpe the catheter was advanced through the sheath and used to select the left renal vein. The catheter was advanced deep into the left renal vein and a left renal venogram was performed. The left renal vein is widely patent. Inflow artifact from the common trunk of the left adrenal vein and gastro renal shunt is visualized. There is mild incompetent flow down the left gonadal vein. A superstiff Amplatz wire was advanced into the renal vein. The 10 French sheath was successfully advanced into the proximal renal vein. The 5 French catheter was removed and the wire was left in place. The 5 French catheter was reintroduced through the sheath coaxially next 2 the Amplatz wire. The catheter was used to successfully catheterize the common trunk of the gastro renal shunt and left adrenal vein. Contrast injection was performed confirming the anatomy. The catheter was then advanced and a second venogram was performed. This reveals that the catheter tip is within the left adrenal vein. The catheter was brought back into the common trunk and utilizing a Glidewire, the catheter was successfully navigated into the gastro renal shunt. A venogram was performed confirming this location. The superstiff Amplatz wire was then advanced into the gastro renal shunt. The 5 French catheter was removed. A 9 French Merci balloon occlusion catheter was advanced over the wire and positioned in the gastro renal shunt. The occlusion balloon was inflated and a balloon occluded retrograde  venogram was performed of the gastric shunt and gastric varices. The initial segment of the gastro renal shunt is capacious and tortuous. Intravenous contrast could not be refluxed into the portal venous system. Therefore, a carbon dioxide balloon occluded venogram was performed. This demonstrates that the afferent flow of the portosystemic shunt is via the left gastric vein. A high-flow Renegade  microcatheter was then advanced coaxially through the [facility] occlusion balloon and into the gastro renal shunt. A significant amount of time was then spent trying to manipulate the catheter deeper into the gastro renal shunt. Unfortunately, the guidewire would only coil in a circular fashion in the initial capacious portion of the gastro renal shunt. A total of 3 different guidewires were tried in an effort to navigate the anatomy. Multiple different tip shapes were also employed. Ultimately, no further progress could be made. At this time, typically we would proceed with a TIPS access for combined antegrade and retrograde balloon occluded transvenous obliteration. Unfortunately, we are unable to proceed with tips at this time given that the patient is only sedated. We will have to reschedule with general anesthesia. The catheters and wires were removed. Hemostasis was attained by manual pressure. IMPRESSION: 1. Ultimately unsuccessful balloon occluded retrograde transvenous obliteration of gastric varices secondary to inability to navigate the highly tortuous and capacious gastro renal shunt. 2. Multi selective and balloon occluded venography demonstrates that the if air in flow from the portal venous system is via the left gastric vein. The patient will likely require a TIPS with combined antegrade and retrograde balloon occluded transvenous obliteration of the gastric varices. We will reschedule for TIPS with BATO/BRTO under general anesthesia in the near future. Signed, Michael Big, MD, RPVI [facility] Radiology Electronically Signed   By: Michael Ayers M.D.   On: 03/30/2018 13:07   Ir Venogram Renal Uni Left  Result Date: 03/30/2018 CLINICAL DATA:  42 year old male with alcoholic cirrhosis complicated by bleeding gastric varices in the presence of a large gastro renal shunt. He presents today for an attempted balloon occluded retrograde  transvenous obliteration under moderate sedation. EXAM: 1. Right internal jugular venous access with ultrasound guidance 2. Left renal vein catheterization and left renal venogram 3. Catheterization of the common trunk of the gastro renal shunt and left adrenal vein with venogram 4. Catheterization of the left adrenal vein with venogram 5. Catheterization of the gastro renal shunt with balloon occluded venogram MEDICATIONS: None. ANESTHESIA/SEDATION: A total of 3 mg Versed and 100 mcg fentanyl were administered intravenously. Patient's level of consciousness and vital signs were monitored continuously by radiology nursing under my direct supervision. The total moderate sedated time was 80 minutes. CONTRAST:  90 cc Omnipaque 350 FLUOROSCOPY TIME:  Fluoroscopy Time: 38 minutes 6 seconds (556 mGy). COMPLICATIONS: None immediate. PROCEDURE: Informed written consent was obtained from the patient after a thorough discussion of the procedural risks, benefits and alternatives. All questions were addressed. Maximal Sterile Barrier Technique was utilized including caps, mask, sterile gowns, sterile gloves, sterile drape, hand hygiene and skin antiseptic. A timeout was performed prior to the initiation of the procedure. The right internal jugular vein was interrogated with ultrasound and found to be widely patent. An image was obtained and stored for the medical record. Local anesthesia was attained by infiltration with 1% lidocaine. A small dermatotomy was made. Under real-time sonographic guidance, the vessel was punctured with a 21 gauge micropuncture needle. Using standard technique, the initial micro needle was exchanged over a 0.018  micro wire for a transitional 4 [place] micro sheath. The micro sheath was then exchanged over a 0.035 wire for a 10 French dilator and the skin tract was dilated. A 10 French TIPS sheath was then advanced over the wire and positioned in the inferior vena cava. A Kumpe the catheter was  advanced through the sheath and used to select the left renal vein. The catheter was advanced deep into the left renal vein and a left renal venogram was performed. The left renal vein is widely patent. Inflow artifact from the common trunk of the left adrenal vein and gastro renal shunt is visualized. There is mild incompetent flow down the left gonadal vein. A superstiff Amplatz wire was advanced into the renal vein. The 10 French sheath was successfully advanced into the proximal renal vein. The 5 French catheter was removed and the wire was left in place. The 5 French catheter was reintroduced through the sheath coaxially next 2 the Amplatz wire. The catheter was used to successfully catheterize the common trunk of the gastro renal shunt and left adrenal vein. Contrast injection was performed confirming the anatomy. The catheter was then advanced and a second venogram was performed. This reveals that the catheter tip is within the left adrenal vein. The catheter was brought back into the common trunk and utilizing a Glidewire, the catheter was successfully navigated into the gastro renal shunt. A venogram was performed confirming this location. The superstiff Amplatz wire was then advanced into the gastro renal shunt. The 5 French catheter was removed. A 9 French Merci balloon occlusion catheter was advanced over the wire and positioned in the gastro renal shunt. The occlusion balloon was inflated and a balloon occluded retrograde venogram was performed of the gastric shunt and gastric varices. The initial segment of the gastro renal shunt is capacious and tortuous. Intravenous contrast could not be refluxed into the portal venous system. Therefore, a carbon dioxide balloon occluded venogram was performed. This demonstrates that the afferent flow of the portosystemic shunt is via the left gastric vein. A high-flow Renegade microcatheter was then advanced coaxially through the [facility] occlusion balloon and into the  gastro renal shunt. A significant amount of time was then spent trying to manipulate the catheter deeper into the gastro renal shunt. Unfortunately, the guidewire would only coil in a circular fashion in the initial capacious portion of the gastro renal shunt. A total of 3 different guidewires were tried in an effort to navigate the anatomy. Multiple different tip shapes were also employed. Ultimately, no further progress could be made. At this time, typically we would proceed with a TIPS access for combined antegrade and retrograde balloon occluded transvenous obliteration. Unfortunately, we are unable to proceed with tips at this time given that the patient is only sedated. We will have to reschedule with general anesthesia. The catheters and wires were removed. Hemostasis was attained by manual pressure. IMPRESSION: 1. Ultimately unsuccessful balloon occluded retrograde transvenous obliteration of gastric varices secondary to inability to navigate the highly tortuous and capacious gastro renal shunt. 2. Multi selective and balloon occluded venography demonstrates that the if air in flow from the portal venous system is via the left gastric vein. The patient will likely require a TIPS with combined antegrade and retrograde balloon occluded transvenous obliteration of the gastric varices. We will reschedule for TIPS with BATO/BRTO under general anesthesia in the near future. Signed, Michael BigHeath K. McCullough, MD, RPVI [facility] Electronically Signed   By: Michael PraderHeath  Archer Asa M.D.   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 He presents today for an attempted balloon occluded retrograde transvenous obliteration under moderate sedation. EXAM: 1. Right internal jugular venous access  with ultrasound guidance 2. Left renal vein catheterization and left renal venogram 3. Catheterization of the common trunk of the gastro renal shunt and left adrenal vein with venogram 4. Catheterization of the left adrenal vein with venogram 5. Catheterization of the gastro renal shunt with balloon occluded venogram MEDICATIONS: None. ANESTHESIA/SEDATION: A total of 3 mg Versed and 100 mcg fentanyl were administered intravenously. Patient's level of consciousness and vital signs were monitored continuously by radiology nursing under my direct supervision. The total moderate sedated time was 80 minutes. CONTRAST:  90 cc Omnipaque 350 FLUOROSCOPY TIME:  Fluoroscopy Time: 38 minutes 6 seconds (556 mGy). COMPLICATIONS: None immediate. PROCEDURE: Informed written consent was obtained from the patient after a thorough discussion of the procedural risks, benefits and alternatives. All questions were addressed. Maximal Sterile Barrier Technique was utilized including caps, mask, sterile gowns, sterile gloves, sterile drape, hand hygiene and skin antiseptic. A timeout was performed prior to the initiation of the procedure. The right internal jugular vein was interrogated with ultrasound and found to be widely patent. An image was obtained and stored for the medical record. Local anesthesia was attained by infiltration with 1% lidocaine. A small dermatotomy was made. Under real-time sonographic guidance, the vessel was punctured with a 21 gauge micropuncture needle. Using standard technique, the initial micro needle was exchanged over a 0.018 micro wire for a transitional 4 [place] micro sheath. The micro sheath was then exchanged over a 0.035 wire for a 10 French dilator and the skin tract was dilated. A 10 French TIPS sheath was then advanced over the wire and positioned in the inferior vena cava. A Kumpe the catheter was advanced through the sheath and used to select the left renal vein. The catheter was advanced deep into  the left renal vein and a left renal venogram was performed. The left renal vein is widely patent. Inflow artifact from the common trunk of the left adrenal vein and gastro renal shunt is visualized. There is mild incompetent flow down the left gonadal vein. A superstiff Amplatz wire was advanced into the renal vein. The 10 French sheath was successfully advanced into the proximal renal vein. The 5 French catheter was removed and the wire was left in place. The 5 French catheter was reintroduced through the sheath coaxially next 2 the Amplatz wire. The catheter was used to successfully catheterize the common trunk of the gastro renal shunt and left adrenal vein. Contrast injection was performed confirming the anatomy. The catheter was then advanced and a second venogram was performed. This reveals that the catheter tip is within the left adrenal vein. The catheter was brought back into the common trunk and utilizing a Glidewire, the catheter was successfully navigated into the gastro renal shunt. A venogram was performed confirming this location. The superstiff Amplatz wire was then advanced into the gastro renal shunt. The 5 French catheter was removed. A 9 French Merci balloon occlusion catheter was advanced over the wire and positioned in the gastro renal shunt. The occlusion balloon was inflated and a balloon occluded retrograde venogram was performed of the gastric shunt and gastric varices. The initial segment of the  gastro renal shunt is capacious and tortuous. Intravenous contrast could not be refluxed into the portal venous system. Therefore, a carbon dioxide balloon occluded venogram was performed. This demonstrates that the afferent flow of the portosystemic shunt is via the left gastric vein. A high-flow Renegade microcatheter was then advanced coaxially through the [facility] occlusion balloon and into the gastro renal shunt. A significant amount of time was then spent trying to manipulate the catheter  deeper into the gastro renal shunt. Unfortunately, the guidewire would only coil in a circular fashion in the initial capacious portion of the gastro renal shunt. A total of 3 different guidewires were tried in an effort to navigate the anatomy. Multiple different tip shapes were also employed. Ultimately, no further progress could be made. At this time, typically we would proceed with a TIPS access for combined antegrade and retrograde balloon occluded transvenous obliteration. Unfortunately, we are unable to proceed with tips at this time given that the patient is only sedated. We will have to reschedule with general anesthesia. The catheters and wires were removed. Hemostasis was attained by manual pressure. IMPRESSION: 1. Ultimately unsuccessful balloon occluded retrograde transvenous obliteration of gastric varices secondary to inability to navigate the highly tortuous and capacious gastro renal shunt. 2. Multi selective and balloon occluded venography demonstrates that the if air in flow from the portal venous system is via the left gastric vein. The patient will likely require a TIPS with combined antegrade and retrograde balloon occluded transvenous obliteration of the gastric varices. We will reschedule for TIPS with BATO/BRTO under general anesthesia in the near future. Signed, Michael Big, MD, RPVI [facility] Radiology Electronically Signed   By: Michael Ayers M.D.   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 He presents today for an attempted balloon occluded retrograde transvenous obliteration under moderate sedation. EXAM: 1. Right internal jugular venous access with ultrasound guidance 2. Left renal vein catheterization and left renal venogram 3.  Catheterization of the common trunk of the gastro renal shunt and left adrenal vein with venogram 4. Catheterization of the left adrenal vein with venogram 5. Catheterization of the gastro renal shunt with balloon occluded venogram MEDICATIONS: None. ANESTHESIA/SEDATION: A total of 3 mg Versed and 100 mcg fentanyl were administered intravenously. Patient's level of consciousness and vital signs were monitored continuously by radiology nursing under my direct supervision. The total moderate sedated time was 80 minutes. CONTRAST:  90 cc Omnipaque 350 FLUOROSCOPY TIME:  Fluoroscopy Time: 38 minutes 6 seconds (556 mGy). COMPLICATIONS: None immediate. PROCEDURE: Informed written consent was obtained from the patient after a thorough discussion of the procedural risks, benefits and alternatives. All questions were addressed. Maximal Sterile Barrier Technique was utilized including caps, mask, sterile gowns, sterile gloves, sterile drape, hand hygiene and skin antiseptic. A timeout was performed prior to the initiation of the procedure. The right internal jugular vein was interrogated with ultrasound and found to be widely patent. An image was obtained and stored for the medical record. Local anesthesia was attained by infiltration with 1% lidocaine. A small dermatotomy was made. Under real-time sonographic guidance, the vessel was punctured with a 21 gauge micropuncture needle. Using standard technique, the initial micro needle was exchanged over a 0.018 micro wire for a transitional 4 [place] micro sheath. The micro sheath was then  exchanged over a 0.035 wire for a 10 French dilator and the skin tract was dilated. A 10 French TIPS sheath was then advanced over the wire and positioned in the inferior vena cava. A Kumpe the catheter was advanced through the sheath and used to select the left renal vein. The catheter was advanced deep into the left renal vein and a left renal venogram was performed. The left renal vein is  widely patent. Inflow artifact from the common trunk of the left adrenal vein and gastro renal shunt is visualized. There is mild incompetent flow down the left gonadal vein. A superstiff Amplatz wire was advanced into the renal vein. The 10 French sheath was successfully advanced into the proximal renal vein. The 5 French catheter was removed and the wire was left in place. The 5 French catheter was reintroduced through the sheath coaxially next 2 the Amplatz wire. The catheter was used to successfully catheterize the common trunk of the gastro renal shunt and left adrenal vein. Contrast injection was performed confirming the anatomy. The catheter was then advanced and a second venogram was performed. This reveals that the catheter tip is within the left adrenal vein. The catheter was brought back into the common trunk and utilizing a Glidewire, the catheter was successfully navigated into the gastro renal shunt. A venogram was performed confirming this location. The superstiff Amplatz wire was then advanced into the gastro renal shunt. The 5 French catheter was removed. A 9 French Merci balloon occlusion catheter was advanced over the wire and positioned in the gastro renal shunt. The occlusion balloon was inflated and a balloon occluded retrograde venogram was performed of the gastric shunt and gastric varices. The initial segment of the gastro renal shunt is capacious and tortuous. Intravenous contrast could not be refluxed into the portal venous system. Therefore, a carbon dioxide balloon occluded venogram was performed. This demonstrates that the afferent flow of the portosystemic shunt is via the left gastric vein. A high-flow Renegade microcatheter was then advanced coaxially through the [facility] occlusion balloon and into the gastro renal shunt. A significant amount of time was then spent trying to manipulate the catheter deeper into the gastro renal shunt. Unfortunately, the guidewire would only coil in a  circular fashion in the initial capacious portion of the gastro renal shunt. A total of 3 different guidewires were tried in an effort to navigate the anatomy. Multiple different tip shapes were also employed. Ultimately, no further progress could be made. At this time, typically we would proceed with a TIPS access for combined antegrade and retrograde balloon occluded transvenous obliteration. Unfortunately, we are unable to proceed with tips at this time given that the patient is only sedated. We will have to reschedule with general anesthesia. The catheters and wires were removed. Hemostasis was attained by manual pressure. IMPRESSION: 1. Ultimately unsuccessful balloon occluded retrograde transvenous obliteration of gastric varices secondary to inability to navigate the highly tortuous and capacious gastro renal shunt. 2. Multi selective and balloon occluded venography demonstrates that the if air in flow from the portal venous system is via the left gastric vein. The patient will likely require a TIPS with combined antegrade and retrograde balloon occluded transvenous obliteration of the gastric varices. We will reschedule for TIPS with BATO/BRTO under general anesthesia in the near future. Signed, Michael Big, MD, RPVI [facility] Inter[facility]iology [facility] Radiology Electronically Signed   By: Michael Ayers M.D.   On: 03/30/2018 13:07    Microbiology: No results  found for this or any previous visit (from the past 240 hour(s)).   Labs: Basic Metabolic Panel: Recent Labs  Lab 03/26/18 2020 03/27/18 0747 03/30/18 0730  NA 141 139 135  K 3.8 4.2 3.6  CL 106 105 100  CO2 23 23 25   GLUCOSE 111* 109* 94  BUN 7 9 9   CREATININE 0.72 0.67 0.81  CALCIUM 8.9 8.3* 9.0   Liver Function Tests: Recent Labs  Lab 03/26/18 2020 03/27/18 0747 03/30/18 0730  AST 148* 110* 240*  ALT 55* 45* 111*  ALKPHOS 105 85 100  BILITOT 1.8* 2.3* 4.0*  PROT 8.1 7.3 7.6  ALBUMIN  3.5 3.1* 3.1*   Recent Labs  Lab 03/26/18 2020  LIPASE 71*   Recent Labs  Lab 03/27/18 0241  AMMONIA 92*   CBC: Recent Labs  Lab 03/27/18 0747 03/27/18 1358 03/27/18 2007 03/29/18 0401 03/30/18 0730  WBC 1.8* 1.4* 1.5* 1.8* 2.0*  HGB 11.5* 11.8* 11.9* 12.8* 13.0  HCT 35.3* 36.6* 35.9* 37.8* 39.4  MCV 92.2 92.2 91.8 90.4 93.1  PLT 58* 53* 55* 60* 75*   Cardiac Enzymes: No results for input(s): CKTOTAL, CKMB, CKMBINDEX, TROPONINI in the last 168 hours. BNP: BNP (last 3 results) No results for input(s): BNP in the last 8760 hours.  ProBNP (last 3 results) No results for input(s): PROBNP in the last 8760 hours.  CBG: Recent Labs  Lab 03/27/18 0603 03/28/18 0637 03/29/18 0720 03/30/18 0616  GLUCAP 92 87 108* 88       Signed:  Laverna Peace, MD [facility] 03/30/2018, 4:29 PM

## 2018-03-30 NOTE — Progress Notes (Signed)
[facility] Progress Note  Michael ChurchManuel Ayers 42 y.o. 11/22/1975  CC: Hematemesis   Subjective: No further bleeding episodes.  Complaining of mild epigastric discomfort.  Denies nausea vomiting.  ROS : Negative for chest pain and shortness of breath.   Objective: Vital signs in last 24 hours: Vitals:   03/30/18 1145 03/30/18 1150  BP: 138/86 135/74  Pulse: (!) 56 (!) 54  Resp: 16 14  Temp:    SpO2: 100% 100%    Physical Exam:  General.  Alert/oriented x3.  Not in acute distress HEART : RRR , no murmur Abdomen soft, nontender, nondistended, bowel sounds present.  Mild icteric.   Lab Results: Recent Labs    03/30/18 0730  NA 135  K 3.6  CL 100  CO2 25  GLUCOSE 94  BUN 9  CREATININE 0.81  CALCIUM 9.0   Recent Labs    03/30/18 0730  AST 240*  ALT 111*  ALKPHOS 100  BILITOT 4.0*  PROT 7.6  ALBUMIN 3.1*   Recent Labs    03/29/18 0401 03/30/18 0730  WBC 1.8* 2.0*  HGB 12.8* 13.0  HCT 37.8* 39.4  MCV 90.4 93.1  PLT 60* 75*   Recent Labs    03/30/18 0730  LABPROT 15.5*  INR 1.24      Assessment/Plan: -Recurrent upper GI bleed.  EGD in July and again few days ago showed small esophageal varices but large gastric varices.  CT abdomen pelvis with IV contrast also showed large gastric varices  -Alcoholic cirrhosis.  Complicated by esophageal and gastric varices, thrombocytopenia. -Abnormal LFTs.  Probably from alcohol use and cirrhosis.  Recommendations ------------------------- -Unsuccessful BRTO with moderate sedation today.  Patient will  need general anesthesia for TIPS with combined BATO/BRTO (antegrade and retrograde balloon occlusion).   -Discussed with Dr. Archer AsaMcCullough.  -he is stable enough to do this procedure as an outpatient basis in the next few weeks.( IR will arrange for follow up) -Continue nadolol. -Once a day PPI discharge  -Okay to discharge from GI standpoint. -GI will sign off.  Call us back if needed. -Follow Up in  GI clinic in 4 weeks.  Michael DerParag Phoua Hoadley MD, FACP 03/30/2018, 12:50 PM  Contact #  [phone]

## 2018-04-06 ENCOUNTER — Other Ambulatory Visit (HOSPITAL_COMMUNITY): Payer: Self-pay | Admitting: Interventional Radiology

## 2018-04-06 DIAGNOSIS — I864 Gastric varices: Secondary | ICD-10-CM

## 2018-04-06 DIAGNOSIS — K922 Gastrointestinal hemorrhage, unspecified: Secondary | ICD-10-CM

## 2018-04-08 ENCOUNTER — Other Ambulatory Visit: Payer: Self-pay | Admitting: Student

## 2018-04-08 ENCOUNTER — Other Ambulatory Visit: Payer: Self-pay | Admitting: Radiology

## 2018-04-08 NOTE — Progress Notes (Signed)
I attempted to call Mr Michael Ayers several time using [employer], phone rings busy.  I called IR and was told that they have not been able to reach patient either and that they are cancelling him and adding an inpatient.  I called OR desk and informed Tajma - patient has not been cancelled from schedule by MD.

## 2018-04-09 ENCOUNTER — Ambulatory Visit (HOSPITAL_COMMUNITY): Payer: Self-pay | Admitting: Certified Registered"

## 2018-04-09 ENCOUNTER — Encounter (HOSPITAL_COMMUNITY): Payer: Self-pay

## 2018-04-09 ENCOUNTER — Other Ambulatory Visit: Payer: Self-pay

## 2018-04-09 ENCOUNTER — Encounter (HOSPITAL_COMMUNITY): Payer: Self-pay | Admitting: Urology

## 2018-04-09 ENCOUNTER — Ambulatory Visit (HOSPITAL_COMMUNITY): Admission: RE | Admit: 2018-04-09 | Payer: MEDICAID | Source: Ambulatory Visit

## 2018-04-09 ENCOUNTER — Ambulatory Visit (HOSPITAL_COMMUNITY)
Admission: RE | Admit: 2018-04-09 | Discharge: 2018-04-09 | Disposition: A | Discharge status: Home / Self Care | Payer: Self-pay | Source: Ambulatory Visit | Attending: Interventional Radiology | Admitting: Interventional Radiology

## 2018-04-09 ENCOUNTER — Observation Stay (HOSPITAL_COMMUNITY)
Admission: RE | Admit: 2018-04-09 | Discharge: 2018-04-10 | Disposition: A | Discharge status: Home / Self Care | Payer: Self-pay | Source: Ambulatory Visit | Attending: Interventional Radiology | Admitting: Interventional Radiology

## 2018-04-09 ENCOUNTER — Encounter (HOSPITAL_COMMUNITY)
Admission: RE | Disposition: A | Discharge status: Home / Self Care | Payer: Self-pay | Source: Ambulatory Visit | Attending: Interventional Radiology

## 2018-04-09 ENCOUNTER — Encounter (HOSPITAL_COMMUNITY): Payer: Self-pay | Admitting: Anesthesiology

## 2018-04-09 DIAGNOSIS — K7030 Alcoholic cirrhosis of liver without ascites: Secondary | ICD-10-CM

## 2018-04-09 DIAGNOSIS — I10 Essential (primary) hypertension: Secondary | ICD-10-CM | POA: Insufficient documentation

## 2018-04-09 DIAGNOSIS — I864 Gastric varices: Secondary | ICD-10-CM

## 2018-04-09 DIAGNOSIS — K922 Gastrointestinal hemorrhage, unspecified: Secondary | ICD-10-CM

## 2018-04-09 DIAGNOSIS — I8511 Secondary esophageal varices with bleeding: Secondary | ICD-10-CM | POA: Diagnosis present

## 2018-04-09 HISTORY — PX: IR VENOGRAM RENAL UNI LEFT: IMG680

## 2018-04-09 HISTORY — PX: IR ANGIOGRAM SELECTIVE EACH ADDITIONAL VESSEL: IMG667

## 2018-04-09 HISTORY — PX: RADIOLOGY WITH ANESTHESIA: SHX6223

## 2018-04-09 HISTORY — PX: IR EMBO ART  VEN HEMORR LYMPH EXTRAV  INC GUIDE ROADMAPPING: IMG5450

## 2018-04-09 HISTORY — PX: IR ANGIOGRAM FOLLOW UP STUDY: IMG697

## 2018-04-09 HISTORY — PX: IR US GUIDE VASC ACCESS RIGHT: IMG2390

## 2018-04-09 HISTORY — PX: IR TIPS: IMG2295

## 2018-04-09 LAB — CBC
HEMATOCRIT: 53.2 % — AB (ref 39.0–52.0)
Hemoglobin: 16.8 g/dL (ref 13.0–17.0)
MCH: 30.6 pg (ref 26.0–34.0)
MCHC: 31.6 g/dL (ref 30.0–36.0)
MCV: 96.9 fL (ref 78.0–100.0)
Platelets: 82 10*3/uL — ABNORMAL LOW (ref 150–400)
RBC: 5.49 MIL/uL (ref 4.22–5.81)
RDW: 16.7 % — AB (ref 11.5–15.5)
WBC: 3.4 10*3/uL — AB (ref 4.0–10.5)

## 2018-04-09 LAB — COMPREHENSIVE METABOLIC PANEL
ALT: 56 U/L — AB (ref 0–44)
AST: 101 U/L — AB (ref 15–41)
Albumin: 3.5 g/dL (ref 3.5–5.0)
Alkaline Phosphatase: 132 U/L — ABNORMAL HIGH (ref 38–126)
Anion gap: 10 (ref 5–15)
BUN: 5 mg/dL — AB (ref 6–20)
CO2: 19 mmol/L — ABNORMAL LOW (ref 22–32)
CREATININE: 0.95 mg/dL (ref 0.61–1.24)
Calcium: 9.4 mg/dL (ref 8.9–10.3)
Chloride: 109 mmol/L (ref 98–111)
GFR calc Af Amer: >60 mL/min (ref >60)
Glucose, Bld: 98 mg/dL (ref 70–99)
Potassium: 3.8 mmol/L (ref 3.5–5.1)
Sodium: 138 mmol/L (ref 135–145)
TOTAL PROTEIN: 7.6 g/dL (ref 6.5–8.1)
Total Bilirubin: 2.3 mg/dL — ABNORMAL HIGH (ref 0.3–1.2)

## 2018-04-09 LAB — POCT I-STAT 7, (LYTES, BLD GAS, ICA,H+H)
ACID-BASE DEFICIT: 2 mmol/L (ref 0.0–2.0)
BICARBONATE: 23.1 mmol/L (ref 20.0–28.0)
Calcium, Ion: 1.15 mmol/L (ref 1.15–1.40)
HCT: 35 % — ABNORMAL LOW (ref 39.0–52.0)
HEMOGLOBIN: 11.9 g/dL — AB (ref 13.0–17.0)
O2 SAT: 100 %
PH ART: 7.391 (ref 7.350–7.450)
PO2 ART: 220 mmHg — AB (ref 83.0–108.0)
Patient temperature: 35.9
Potassium: 3.7 mmol/L (ref 3.5–5.1)
Sodium: 139 mmol/L (ref 135–145)
TCO2: 24 mmol/L (ref 22–32)
pCO2 arterial: 37.7 mmHg (ref 32.0–48.0)

## 2018-04-09 LAB — PROTIME-INR
INR: 1.16
PROTHROMBIN TIME: 14.7 s (ref 11.4–15.2)

## 2018-04-09 LAB — PREPARE RBC (CROSSMATCH)

## 2018-04-09 SURGERY — IR WITH ANESTHESIA
Anesthesia: General

## 2018-04-09 MED ORDER — LACTATED RINGERS IV SOLN
INTRAVENOUS | Status: DC
  Administered 2018-04-09: 11:00:00 via INTRAVENOUS

## 2018-04-09 MED ORDER — LIPIODOL ULTRAFLUID INJECTION
INTRAMUSCULAR | Status: DC | PRN
  Administered 2018-04-09: 10 mL

## 2018-04-09 MED ORDER — GLYCOPYRROLATE 0.2 MG/ML IJ SOLN
INTRAMUSCULAR | Status: DC | PRN
  Administered 2018-04-09: 0.2 mg via INTRAVENOUS

## 2018-04-09 MED ORDER — HYDROCODONE-ACETAMINOPHEN 5-325 MG PO TABS
1.0000 | ORAL_TABLET | ORAL | Status: DC | PRN
  Administered 2018-04-09: 1 via ORAL
  Filled 2018-04-09: qty 1

## 2018-04-09 MED ORDER — NADOLOL 20 MG PO TABS
20.0000 mg | ORAL_TABLET | Freq: Every day | ORAL | Status: DC
  Administered 2018-04-09 – 2018-04-10 (×2): 20 mg via ORAL
  Filled 2018-04-09 (×2): qty 1

## 2018-04-09 MED ORDER — FENTANYL CITRATE (PF) 100 MCG/2ML IJ SOLN
INTRAMUSCULAR | Status: DC | PRN
  Administered 2018-04-09: 100 ug via INTRAVENOUS
  Administered 2018-04-09 (×3): 50 ug via INTRAVENOUS

## 2018-04-09 MED ORDER — SODIUM CHLORIDE 0.9 % IV SOLN
INTRAVENOUS | Status: DC | PRN
  Administered 2018-04-09: 30 ug/min via INTRAVENOUS

## 2018-04-09 MED ORDER — FENTANYL CITRATE (PF) 100 MCG/2ML IJ SOLN
25.0000 ug | INTRAMUSCULAR | Status: DC | PRN
  Administered 2018-04-09 (×2): 50 ug via INTRAVENOUS

## 2018-04-09 MED ORDER — SODIUM TETRADECYL SULFATE 1 % IV SOLN
INTRAVENOUS | Status: DC | PRN
  Administered 2018-04-09: 10 mL

## 2018-04-09 MED ORDER — ROCURONIUM BROMIDE 10 MG/ML (PF) SYRINGE
PREFILLED_SYRINGE | INTRAVENOUS | Status: DC | PRN
  Administered 2018-04-09: 20 mg via INTRAVENOUS
  Administered 2018-04-09 (×2): 50 mg via INTRAVENOUS

## 2018-04-09 MED ORDER — LACTULOSE 10 GM/15ML PO SOLN
20.0000 g | Freq: Three times a day (TID) | ORAL | Status: DC
  Administered 2018-04-09 – 2018-04-10 (×2): 20 g via ORAL
  Filled 2018-04-09 (×2): qty 30

## 2018-04-09 MED ORDER — DEXAMETHASONE SODIUM PHOSPHATE 10 MG/ML IJ SOLN
INTRAMUSCULAR | Status: DC | PRN
  Administered 2018-04-09: 10 mg via INTRAVENOUS

## 2018-04-09 MED ORDER — MIDAZOLAM HCL 2 MG/2ML IJ SOLN
INTRAMUSCULAR | Status: DC | PRN
  Administered 2018-04-09: 2 mg via INTRAVENOUS

## 2018-04-09 MED ORDER — SUGAMMADEX SODIUM 200 MG/2ML IV SOLN
INTRAVENOUS | Status: DC | PRN
  Administered 2018-04-09: 100 mg via INTRAVENOUS

## 2018-04-09 MED ORDER — SODIUM CHLORIDE 0.9 % IV SOLN
INTRAVENOUS | Status: DC
  Administered 2018-04-09: via INTRAVENOUS

## 2018-04-09 MED ORDER — LIDOCAINE 2% (20 MG/ML) 5 ML SYRINGE
INTRAMUSCULAR | Status: DC | PRN
  Administered 2018-04-09: 60 mg via INTRAVENOUS

## 2018-04-09 MED ORDER — PROPOFOL 10 MG/ML IV BOLUS
INTRAVENOUS | Status: DC | PRN
  Administered 2018-04-09: 140 mg via INTRAVENOUS

## 2018-04-09 MED ORDER — MIDAZOLAM HCL 2 MG/2ML IJ SOLN
INTRAMUSCULAR | Status: AC
  Filled 2018-04-09: qty 2

## 2018-04-09 MED ORDER — PROMETHAZINE HCL 25 MG/ML IJ SOLN: 6.2500 mg | INTRAMUSCULAR | Status: DC | PRN

## 2018-04-09 MED ORDER — SODIUM CHLORIDE 0.9 % IV SOLN: INTRAVENOUS | Status: DC

## 2018-04-09 MED ORDER — LACTATED RINGERS IV SOLN: INTRAVENOUS | Status: DC

## 2018-04-09 MED ORDER — FENTANYL CITRATE (PF) 100 MCG/2ML IJ SOLN
INTRAMUSCULAR | Status: AC
  Filled 2018-04-09: qty 2

## 2018-04-09 MED ORDER — ONDANSETRON HCL 4 MG/2ML IJ SOLN
INTRAMUSCULAR | Status: DC | PRN
  Administered 2018-04-09: 4 mg via INTRAVENOUS

## 2018-04-09 MED ORDER — LACTATED RINGERS IV SOLN
INTRAVENOUS | Status: DC | PRN
  Administered 2018-04-09 (×2): via INTRAVENOUS

## 2018-04-09 MED ORDER — PHENYLEPHRINE 40 MCG/ML (10ML) SYRINGE FOR IV PUSH (FOR BLOOD PRESSURE SUPPORT)
PREFILLED_SYRINGE | INTRAVENOUS | Status: DC | PRN
  Administered 2018-04-09 (×3): 80 ug via INTRAVENOUS

## 2018-04-09 MED ORDER — SODIUM CHLORIDE 0.9% IV SOLUTION: Freq: Once | INTRAVENOUS | Status: DC

## 2018-04-09 MED ORDER — IOHEXOL 300 MG/ML  SOLN: 170.0000 mL | Freq: Once | INTRAMUSCULAR | Status: DC | PRN

## 2018-04-09 MED ORDER — CEFAZOLIN SODIUM-DEXTROSE 2-4 GM/100ML-% IV SOLN
2.0000 g | INTRAVENOUS | Status: AC
  Administered 2018-04-09: 2 g via INTRAVENOUS
  Filled 2018-04-09 (×2): qty 100

## 2018-04-09 MED ORDER — MEPERIDINE HCL 50 MG/ML IJ SOLN: 6.2500 mg | INTRAMUSCULAR | Status: DC | PRN

## 2018-04-09 NOTE — H&P (Signed)
Chief Complaint: Patient was seen in consultation today for Transjugular intrahepatic portal system shunt placement/Balloon occluded retrograde transvenous obliteration at the request of Dr Claria Dice  Supervising Physician: Malachy Moan  Patient Status: [facility] - Out-pt  History of Present Illness: Michael Ayers is a 42 y.o. male   Dr Archer Asa note 9/24 Gastric varices in the setting of several recent bleeds and a large gastrorenal shunt.  Varices should be amenable to BRTO.  Will make an attempt under moderate sedation    If unsuccessful with just sedation, we will reschedule for out-pt BRTO +/- TIPS to be done in the coming weeks.  IMPRESSION: 1. Ultimately unsuccessful balloon occluded retrograde transvenous obliteration of gastric varices secondary to inability to navigate the highly tortuous and capacious gastro renal shunt. 2. Multi selective and balloon occluded venography demonstrates that the if air in flow from the portal venous system is via the left gastric vein. The patient will likely require a TIPS with combined antegrade and retrograde balloon occluded transvenous obliteration of the gastric varices. We will reschedule for TIPS with BATO/BRTO under general anesthesia in the near future.  Returns to IR today - rescheduled from 9/24 Has no complaints No bleeding No N/V Minimal abd pain in low abd   Past Medical History:  Diagnosis Date  . Alcohol abuse   . Duodenitis   . Esophageal varices (HCC)   . Essential hypertension   . Perforated peptic ulcer (HCC)     Past Surgical History:  Procedure Laterality Date  . ESOPHAGOGASTRODUODENOSCOPY (EGD) WITH PROPOFOL N/A 01/16/2018   Procedure: ESOPHAGOGASTRODUODENOSCOPY (EGD) WITH PROPOFOL;  Surgeon: Vida Rigger, MD;  Location: [facility] ENDOSCOPY;  Service: Endoscopy;  Laterality: N/A;  . ESOPHAGOGASTRODUODENOSCOPY (EGD) WITH PROPOFOL N/A 03/27/2018   Procedure: ESOPHAGOGASTRODUODENOSCOPY (EGD) WITH PROPOFOL;   Surgeon: Carman Ching, MD;  Location: [facility] ENDOSCOPY;  Service: Endoscopy;  Laterality: N/A;  . IR ANGIOGRAM SELECTIVE EACH ADDITIONAL VESSEL  03/30/2018  . IR ANGIOGRAM SELECTIVE EACH ADDITIONAL VESSEL  03/30/2018  . IR US GUIDE VASC ACCESS RIGHT  03/30/2018  . IR VENOGRAM ADRENAL UNI LEFT  03/30/2018  . IR VENOGRAM RENAL UNI LEFT  03/30/2018  . Perforated peptic ulcer      Allergies: Patient has no known allergies.  Medications: Prior to Admission medications   Medication Sig Start Date End Date Taking? Authorizing Provider  folic acid (FOLVITE) 1 MG tablet Take 1 tablet (1 mg total) by mouth daily. 03/31/18   Laverna Peace, MD  Multiple Vitamin (MULTIVITAMIN WITH MINERALS) TABS tablet Take 1 tablet by mouth daily. 03/31/18   Roberto Scales D, MD  nadolol (CORGARD) 20 MG tablet Take 1 tablet (20 mg total) by mouth daily. 03/30/18   Laverna Peace, MD     Family History  Problem Relation Age of Onset  . Diabetes Mellitus II Mother     Social History   Socioeconomic History  . Marital status: Single    Spouse name: Not on file  . Number of children: Not on file  . Years of education: Not on file  . Highest education level: Not on file  Occupational History  . Not on file  Social Needs  . Financial resource strain: Not on file  . Food insecurity:    Worry: Not on file    Inability: Not on file  . Transportation needs:    Medical: Not on file    Non-medical: Not on file  Tobacco Use  . Smoking status: Never Smoker  . Smokeless  tobacco: Never Used  Substance and Sexual Activity  . Alcohol use: Yes    Alcohol/week: 4.0 - 5.0 standard drinks    Types: 4 - 5 Cans of beer per week    Comment: hasn't drank for a few days 04/09/18  . Drug use: Never  . Sexual activity: Yes  Lifestyle  . Physical activity:    Days per week: Not on file    Minutes per session: Not on file  . Stress: Not on file  Relationships  . Social connections:    Talks on phone: Not on file    Gets  together: Not on file    Attends religious service: Not on file    Active member of club or organization: Not on file    Attends meetings of clubs or organizations: Not on file    Relationship status: Not on file  Other Topics Concern  . Not on file  Social History Narrative  . Not on file    Review of Systems: A 12 point ROS discussed and pertinent positives are indicated in the HPI above.  All other systems are negative.  Review of Systems  Constitutional: Negative for activity change, appetite change, fatigue and fever.  Respiratory: Negative for cough and shortness of breath.   Cardiovascular: Negative for chest pain.  Gastrointestinal: Negative for abdominal distention, abdominal pain, nausea and vomiting.  Musculoskeletal: Negative for gait problem.  Neurological: Negative for weakness.  Psychiatric/Behavioral: Negative for behavioral problems and confusion.    Vital Signs: There were no vitals taken for this visit.  Physical Exam  Constitutional: He is oriented to person, place, and time. He appears well-nourished.  HENT:  Head: Atraumatic.  Eyes: EOM are normal.  Neck: Neck supple.  Cardiovascular: Normal rate, regular rhythm and normal heart sounds.  No murmur heard. Pulmonary/Chest: Effort normal and breath sounds normal. He has no wheezes.  Abdominal: Soft. Bowel sounds are normal. There is no tenderness.  Musculoskeletal: Normal range of motion.  Neurological: He is alert and oriented to person, place, and time.  Skin: Skin is warm and dry.  Psychiatric: He has a normal mood and affect. His behavior is normal. Judgment and thought content normal.  Nursing note and vitals reviewed.   Imaging: Ct Abdomen Pelvis W Wo Contrast  Result Date: 03/27/2018 CLINICAL DATA:  Cirrhosis, hematemesis and gastric varices, portal gastropathy and grade 1 esophageal varices by endoscopy. EXAM: CT ABDOMEN AND PELVIS WITHOUT AND WITH CONTRAST TECHNIQUE: Multidetector CT imaging  of the abdomen and pelvis was performed following the standard protocol before and following the bolus administration of intravenous contrast. CONTRAST:  ISOVUE-370 IOPAMIDOL (ISOVUE-370) INJECTION 76% COMPARISON:  None. FINDINGS: Lower chest: Mild bibasilar atelectasis. Hepatobiliary: The liver demonstrates cirrhosis with nodular contour. The right lobe demonstrates some relative atrophy. No evidence of focal hepatic mass or biliary ductal dilatation. The gallbladder contains some small dependent calcified calculi. No evidence of gallbladder inflammation or distention. Pancreas: Unremarkable. No pancreatic ductal dilatation or surrounding inflammatory changes. Spleen: The spleen is moderately enlarged. No evidence of splenic mass or abnormal perfusion. The splenic artery and vein are patent. Adrenals/Urinary Tract: Adrenal glands are unremarkable. Kidneys are normal, without renal calculi, focal lesion, or hydronephrosis. Bladder is distended. Stomach/Bowel: Bowel shows mild dilatation of left-sided jejunal loops likely representing some degree ileus. No overt obstruction, significant bowel inflammation or lesion is identified. No free intraperitoneal air is detected. Vascular/Lymphatic: No enlarged lymph nodes. Arterial vascular evaluation shows normal patency of the abdominal aorta and  visceral branches. Hepatic arterial supply is via of traditional branch vessel anatomy of the celiac axis. The superior mesenteric artery, bilateral renal arteries and inferior mesenteric arteries show normal patency. Venous evaluation demonstrates prominent gastric varices projecting into the gastric lumen along the cardia and proximal fundus. Protruding gastric varices measure up to 12 mm in estimated maximal diameter. Prominent portosystemic shunting is noted with short gastric branches off of the splenic vein supplying the gastric varices and emptying via a patent and tortuous gastro-renal shunt draining into the left  renal vein. This shunt reaches a maximal caliber of approximately 15-16 mm. No thrombus identified in the varicosities or shunt. There are some small distal esophageal varices noted. No other venous shunts are identified. Mesenteric veins, splenic vein and portal vein show normal patency without evidence of thrombus. Intrahepatic portal veins and hepatic veins are well demonstrated and show normal patency and branching anatomy. The IVC, renal veins, iliac veins and common femoral veins demonstrate normal patency. Reproductive: Prostate is unremarkable. Other: No hernias identified. There is a trace amount of ascites around the liver. Musculoskeletal: No acute or significant osseous findings. IMPRESSION: 1. Evidence of cirrhosis and portal hypertension with prominent portosystemic shunting primarily via branches of the splenic vein supplying prominent gastric varices and emptying via a large gastro-renal shunt into the left renal vein. Gastric varices are the presumed source of upper GI bleeding given their protrusion into the gastric lumen at the level of the cardia and fundus and a minimal component of distal esophageal varices by comparison. Based on CT anatomy, the patient would potentially be a candidate for BRTO to treat bleeding gastric varices. A TIPS procedure would not likely be needed given the predominance of gastric varices. 2. Trace ascites surrounding the liver. 3. Moderate splenomegaly. 4. Cholelithiasis without evidence of cholecystitis or biliary obstruction. 5. Cirrhosis of the liver without evidence of hepatic masses. 6. Probable component of mild ileus involving the jejunum without significant bowel obstruction. Electronically Signed   By: Irish Lack M.D.   On: 03/27/2018 16:49   Ir Angiogram Selective Each Additional Vessel  Result Date: 03/30/2018 CLINICAL DATA:  42 year old male with alcoholic cirrhosis complicated by bleeding gastric varices in the presence of a large gastro renal  shunt. He presents today for an attempted balloon occluded retrograde transvenous obliteration under moderate sedation. EXAM: 1. Right internal jugular venous access with ultrasound guidance 2. Left renal vein catheterization and left renal venogram 3. Catheterization of the common trunk of the gastro renal shunt and left adrenal vein with venogram 4. Catheterization of the left adrenal vein with venogram 5. Catheterization of the gastro renal shunt with balloon occluded venogram MEDICATIONS: None. ANESTHESIA/SEDATION: A total of 3 mg Versed and 100 mcg fentanyl were administered intravenously. Patient's level of consciousness and vital signs were monitored continuously by radiology nursing under my direct supervision. The total moderate sedated time was 80 minutes. CONTRAST:  90 cc Omnipaque 350 FLUOROSCOPY TIME:  Fluoroscopy Time: 38 minutes 6 seconds (556 mGy). COMPLICATIONS: None immediate. PROCEDURE: Informed written consent was obtained from the patient after a thorough discussion of the procedural risks, benefits and alternatives. All questions were addressed. Maximal Sterile Barrier Technique was utilized including caps, mask, sterile gowns, sterile gloves, sterile drape, hand hygiene and skin antiseptic. A timeout was performed prior to the initiation of the procedure. The right internal jugular vein was interrogated with ultrasound and found to be widely patent. An image was obtained and stored for the medical record. Local anesthesia was attained  by infiltration with 1% lidocaine. A small dermatotomy was made. Under real-time sonographic guidance, the vessel was punctured with a 21 gauge micropuncture needle. Using standard technique, the initial micro needle was exchanged over a 0.018 micro wire for a transitional 4 [place] micro sheath. The micro sheath was then exchanged over a 0.035 wire for a 10 French dilator and the skin tract was dilated. A 10 French TIPS sheath was then advanced over the wire and  positioned in the inferior vena cava. A Kumpe the catheter was advanced through the sheath and used to select the left renal vein. The catheter was advanced deep into the left renal vein and a left renal venogram was performed. The left renal vein is widely patent. Inflow artifact from the common trunk of the left adrenal vein and gastro renal shunt is visualized. There is mild incompetent flow down the left gonadal vein. A superstiff Amplatz wire was advanced into the renal vein. The 10 French sheath was successfully advanced into the proximal renal vein. The 5 French catheter was removed and the wire was left in place. The 5 French catheter was reintroduced through the sheath coaxially next 2 the Amplatz wire. The catheter was used to successfully catheterize the common trunk of the gastro renal shunt and left adrenal vein. Contrast injection was performed confirming the anatomy. The catheter was then advanced and a second venogram was performed. This reveals that the catheter tip is within the left adrenal vein. The catheter was brought back into the common trunk and utilizing a Glidewire, the catheter was successfully navigated into the gastro renal shunt. A venogram was performed confirming this location. The superstiff Amplatz wire was then advanced into the gastro renal shunt. The 5 French catheter was removed. A 9 French Merci balloon occlusion catheter was advanced over the wire and positioned in the gastro renal shunt. The occlusion balloon was inflated and a balloon occluded retrograde venogram was performed of the gastric shunt and gastric varices. The initial segment of the gastro renal shunt is capacious and tortuous. Intravenous contrast could not be refluxed into the portal venous system. Therefore, a carbon dioxide balloon occluded venogram was performed. This demonstrates that the afferent flow of the portosystemic shunt is via the left gastric vein. A high-flow Renegade microcatheter was then  advanced coaxially through the [facility] occlusion balloon and into the gastro renal shunt. A significant amount of time was then spent trying to manipulate the catheter deeper into the gastro renal shunt. Unfortunately, the guidewire would only coil in a circular fashion in the initial capacious portion of the gastro renal shunt. A total of 3 different guidewires were tried in an effort to navigate the anatomy. Multiple different tip shapes were also employed. Ultimately, no further progress could be made. At this time, typically we would proceed with a TIPS access for combined antegrade and retrograde balloon occluded transvenous obliteration. Unfortunately, we are unable to proceed with tips at this time given that the patient is only sedated. We will have to reschedule with general anesthesia. The catheters and wires were removed. Hemostasis was attained by manual pressure. IMPRESSION: 1. Ultimately unsuccessful balloon occluded retrograde transvenous obliteration of gastric varices secondary to inability to navigate the highly tortuous and capacious gastro renal shunt. 2. Multi selective and balloon occluded venography demonstrates that the if air in flow from the portal venous system is via the left gastric vein. The patient will likely require a TIPS with combined antegrade and retrograde balloon occluded transvenous obliteration of  the gastric varices. We will reschedule for TIPS with BATO/BRTO under general anesthesia in the near future. Signed, Sterling Big, MD, RPVI [facility] Radiology Electronically Signed   By: Malachy Moan M.D.   On: 03/30/2018 13:07   Ir Angiogram Selective Each Additional Vessel  Result Date: 03/30/2018 CLINICAL DATA:  42 year old male with alcoholic cirrhosis complicated by bleeding gastric varices in the presence of a large gastro renal shunt. He presents today for an attempted balloon occluded retrograde transvenous  obliteration under moderate sedation. EXAM: 1. Right internal jugular venous access with ultrasound guidance 2. Left renal vein catheterization and left renal venogram 3. Catheterization of the common trunk of the gastro renal shunt and left adrenal vein with venogram 4. Catheterization of the left adrenal vein with venogram 5. Catheterization of the gastro renal shunt with balloon occluded venogram MEDICATIONS: None. ANESTHESIA/SEDATION: A total of 3 mg Versed and 100 mcg fentanyl were administered intravenously. Patient's level of consciousness and vital signs were monitored continuously by radiology nursing under my direct supervision. The total moderate sedated time was 80 minutes. CONTRAST:  90 cc Omnipaque 350 FLUOROSCOPY TIME:  Fluoroscopy Time: 38 minutes 6 seconds (556 mGy). COMPLICATIONS: None immediate. PROCEDURE: Informed written consent was obtained from the patient after a thorough discussion of the procedural risks, benefits and alternatives. All questions were addressed. Maximal Sterile Barrier Technique was utilized including caps, mask, sterile gowns, sterile gloves, sterile drape, hand hygiene and skin antiseptic. A timeout was performed prior to the initiation of the procedure. The right internal jugular vein was interrogated with ultrasound and found to be widely patent. An image was obtained and stored for the medical record. Local anesthesia was attained by infiltration with 1% lidocaine. A small dermatotomy was made. Under real-time sonographic guidance, the vessel was punctured with a 21 gauge micropuncture needle. Using standard technique, the initial micro needle was exchanged over a 0.018 micro wire for a transitional 4 [place] micro sheath. The micro sheath was then exchanged over a 0.035 wire for a 10 French dilator and the skin tract was dilated. A 10 French TIPS sheath was then advanced over the wire and positioned in the inferior vena cava. A Kumpe the catheter was advanced through  the sheath and used to select the left renal vein. The catheter was advanced deep into the left renal vein and a left renal venogram was performed. The left renal vein is widely patent. Inflow artifact from the common trunk of the left adrenal vein and gastro renal shunt is visualized. There is mild incompetent flow down the left gonadal vein. A superstiff Amplatz wire was advanced into the renal vein. The 10 French sheath was successfully advanced into the proximal renal vein. The 5 French catheter was removed and the wire was left in place. The 5 French catheter was reintroduced through the sheath coaxially next 2 the Amplatz wire. The catheter was used to successfully catheterize the common trunk of the gastro renal shunt and left adrenal vein. Contrast injection was performed confirming the anatomy. The catheter was then advanced and a second venogram was performed. This reveals that the catheter tip is within the left adrenal vein. The catheter was brought back into the common trunk and utilizing a Glidewire, the catheter was successfully navigated into the gastro renal shunt. A venogram was performed confirming this location. The superstiff Amplatz wire was then advanced into the gastro renal shunt. The 5 French catheter was removed. A 9 French Merci balloon occlusion  catheter was advanced over the wire and positioned in the gastro renal shunt. The occlusion balloon was inflated and a balloon occluded retrograde venogram was performed of the gastric shunt and gastric varices. The initial segment of the gastro renal shunt is capacious and tortuous. Intravenous contrast could not be refluxed into the portal venous system. Therefore, a carbon dioxide balloon occluded venogram was performed. This demonstrates that the afferent flow of the portosystemic shunt is via the left gastric vein. A high-flow Renegade microcatheter was then advanced coaxially through the [facility] occlusion balloon and into the gastro renal  shunt. A significant amount of time was then spent trying to manipulate the catheter deeper into the gastro renal shunt. Unfortunately, the guidewire would only coil in a circular fashion in the initial capacious portion of the gastro renal shunt. A total of 3 different guidewires were tried in an effort to navigate the anatomy. Multiple different tip shapes were also employed. Ultimately, no further progress could be made. At this time, typically we would proceed with a TIPS access for combined antegrade and retrograde balloon occluded transvenous obliteration. Unfortunately, we are unable to proceed with tips at this time given that the patient is only sedated. We will have to reschedule with general anesthesia. The catheters and wires were removed. Hemostasis was attained by manual pressure. IMPRESSION: 1. Ultimately unsuccessful balloon occluded retrograde transvenous obliteration of gastric varices secondary to inability to navigate the highly tortuous and capacious gastro renal shunt. 2. Multi selective and balloon occluded venography demonstrates that the if air in flow from the portal venous system is via the left gastric vein. The patient will likely require a TIPS with combined antegrade and retrograde balloon occluded transvenous obliteration of the gastric varices. We will reschedule for TIPS with BATO/BRTO under general anesthesia in the near future. Signed, Sterling Big, MD, RPVI [facility] Radiology Electronically Signed   By: Malachy Moan M.D.   On: 03/30/2018 13:07   Ir Venogram Renal Uni Left  Result Date: 03/30/2018 CLINICAL DATA:  42 year old male with alcoholic cirrhosis complicated by bleeding gastric varices in the presence of a large gastro renal shunt. He presents today for an attempted balloon occluded retrograde transvenous obliteration under moderate sedation. EXAM: 1. Right internal jugular venous access with ultrasound  guidance 2. Left renal vein catheterization and left renal venogram 3. Catheterization of the common trunk of the gastro renal shunt and left adrenal vein with venogram 4. Catheterization of the left adrenal vein with venogram 5. Catheterization of the gastro renal shunt with balloon occluded venogram MEDICATIONS: None. ANESTHESIA/SEDATION: A total of 3 mg Versed and 100 mcg fentanyl were administered intravenously. Patient's level of consciousness and vital signs were monitored continuously by radiology nursing under my direct supervision. The total moderate sedated time was 80 minutes. CONTRAST:  90 cc Omnipaque 350 FLUOROSCOPY TIME:  Fluoroscopy Time: 38 minutes 6 seconds (556 mGy). COMPLICATIONS: None immediate. PROCEDURE: Informed written consent was obtained from the patient after a thorough discussion of the procedural risks, benefits and alternatives. All questions were addressed. Maximal Sterile Barrier Technique was utilized including caps, mask, sterile gowns, sterile gloves, sterile drape, hand hygiene and skin antiseptic. A timeout was performed prior to the initiation of the procedure. The right internal jugular vein was interrogated with ultrasound and found to be widely patent. An image was obtained and stored for the medical record. Local anesthesia was attained by infiltration with 1% lidocaine. A small dermatotomy was made. Under real-time sonographic  guidance, the vessel was punctured with a 21 gauge micropuncture needle. Using standard technique, the initial micro needle was exchanged over a 0.018 micro wire for a transitional 4 [place] micro sheath. The micro sheath was then exchanged over a 0.035 wire for a 10 French dilator and the skin tract was dilated. A 10 French TIPS sheath was then advanced over the wire and positioned in the inferior vena cava. A Kumpe the catheter was advanced through the sheath and used to select the left renal vein. The catheter was advanced deep into the left renal  vein and a left renal venogram was performed. The left renal vein is widely patent. Inflow artifact from the common trunk of the left adrenal vein and gastro renal shunt is visualized. There is mild incompetent flow down the left gonadal vein. A superstiff Amplatz wire was advanced into the renal vein. The 10 French sheath was successfully advanced into the proximal renal vein. The 5 French catheter was removed and the wire was left in place. The 5 French catheter was reintroduced through the sheath coaxially next 2 the Amplatz wire. The catheter was used to successfully catheterize the common trunk of the gastro renal shunt and left adrenal vein. Contrast injection was performed confirming the anatomy. The catheter was then advanced and a second venogram was performed. This reveals that the catheter tip is within the left adrenal vein. The catheter was brought back into the common trunk and utilizing a Glidewire, the catheter was successfully navigated into the gastro renal shunt. A venogram was performed confirming this location. The superstiff Amplatz wire was then advanced into the gastro renal shunt. The 5 French catheter was removed. A 9 French Merci balloon occlusion catheter was advanced over the wire and positioned in the gastro renal shunt. The occlusion balloon was inflated and a balloon occluded retrograde venogram was performed of the gastric shunt and gastric varices. The initial segment of the gastro renal shunt is capacious and tortuous. Intravenous contrast could not be refluxed into the portal venous system. Therefore, a carbon dioxide balloon occluded venogram was performed. This demonstrates that the afferent flow of the portosystemic shunt is via the left gastric vein. A high-flow Renegade microcatheter was then advanced coaxially through the [facility] occlusion balloon and into the gastro renal shunt. A significant amount of time was then spent trying to manipulate the catheter deeper into the  gastro renal shunt. Unfortunately, the guidewire would only coil in a circular fashion in the initial capacious portion of the gastro renal shunt. A total of 3 different guidewires were tried in an effort to navigate the anatomy. Multiple different tip shapes were also employed. Ultimately, no further progress could be made. At this time, typically we would proceed with a TIPS access for combined antegrade and retrograde balloon occluded transvenous obliteration. Unfortunately, we are unable to proceed with tips at this time given that the patient is only sedated. We will have to reschedule with general anesthesia. The catheters and wires were removed. Hemostasis was attained by manual pressure. IMPRESSION: 1. Ultimately unsuccessful balloon occluded retrograde transvenous obliteration of gastric varices secondary to inability to navigate the highly tortuous and capacious gastro renal shunt. 2. Multi selective and balloon occluded venography demonstrates that the if air in flow from the portal venous system is via the left gastric vein. The patient will likely require a TIPS with combined antegrade and retrograde balloon occluded transvenous obliteration of the gastric varices. We will reschedule for TIPS with BATO/BRTO under general anesthesia  in the near future. Signed, Sterling Big, MD, RPVI [facility] Radiology Electronically Signed   By: Malachy Moan M.D.   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 He presents today for an attempted balloon occluded retrograde transvenous obliteration under moderate sedation. EXAM: 1. Right internal jugular venous access with ultrasound guidance 2. Left renal vein catheterization and left renal venogram 3. Catheterization of the common  trunk of the gastro renal shunt and left adrenal vein with venogram 4. Catheterization of the left adrenal vein with venogram 5. Catheterization of the gastro renal shunt with balloon occluded venogram MEDICATIONS: None. ANESTHESIA/SEDATION: A total of 3 mg Versed and 100 mcg fentanyl were administered intravenously. Patient's level of consciousness and vital signs were monitored continuously by radiology nursing under my direct supervision. The total moderate sedated time was 80 minutes. CONTRAST:  90 cc Omnipaque 350 FLUOROSCOPY TIME:  Fluoroscopy Time: 38 minutes 6 seconds (556 mGy). COMPLICATIONS: None immediate. PROCEDURE: Informed written consent was obtained from the patient after a thorough discussion of the procedural risks, benefits and alternatives. All questions were addressed. Maximal Sterile Barrier Technique was utilized including caps, mask, sterile gowns, sterile gloves, sterile drape, hand hygiene and skin antiseptic. A timeout was performed prior to the initiation of the procedure. The right internal jugular vein was interrogated with ultrasound and found to be widely patent. An image was obtained and stored for the medical record. Local anesthesia was attained by infiltration with 1% lidocaine. A small dermatotomy was made. Under real-time sonographic guidance, the vessel was punctured with a 21 gauge micropuncture needle. Using standard technique, the initial micro needle was exchanged over a 0.018 micro wire for a transitional 4 [place] micro sheath. The micro sheath was then exchanged over a 0.035 wire for a 10 French dilator and the skin tract was dilated. A 10 French TIPS sheath was then advanced over the wire and positioned in the inferior vena cava. A Kumpe the catheter was advanced through the sheath and used to select the left renal vein. The catheter was advanced deep into the left renal vein and a left renal venogram was performed. The left renal vein is widely patent. Inflow artifact  from the common trunk of the left adrenal vein and gastro renal shunt is visualized. There is mild incompetent flow down the left gonadal vein. A superstiff Amplatz wire was advanced into the renal vein. The 10 French sheath was successfully advanced into the proximal renal vein. The 5 French catheter was removed and the wire was left in place. The 5 French catheter was reintroduced through the sheath coaxially next 2 the Amplatz wire. The catheter was used to successfully catheterize the common trunk of the gastro renal shunt and left adrenal vein. Contrast injection was performed confirming the anatomy. The catheter was then advanced and a second venogram was performed. This reveals that the catheter tip is within the left adrenal vein. The catheter was brought back into the common trunk and utilizing a Glidewire, the catheter was successfully navigated into the gastro renal shunt. A venogram was performed confirming this location. The superstiff Amplatz wire was then advanced into the gastro renal shunt. The 5 French catheter was removed. A 9 French Merci balloon occlusion catheter was advanced over the wire and positioned in the gastro renal shunt. The  occlusion balloon was inflated and a balloon occluded retrograde venogram was performed of the gastric shunt and gastric varices. The initial segment of the gastro renal shunt is capacious and tortuous. Intravenous contrast could not be refluxed into the portal venous system. Therefore, a carbon dioxide balloon occluded venogram was performed. This demonstrates that the afferent flow of the portosystemic shunt is via the left gastric vein. A high-flow Renegade microcatheter was then advanced coaxially through the [facility] occlusion balloon and into the gastro renal shunt. A significant amount of time was then spent trying to manipulate the catheter deeper into the gastro renal shunt. Unfortunately, the guidewire would only coil in a circular fashion in the initial  capacious portion of the gastro renal shunt. A total of 3 different guidewires were tried in an effort to navigate the anatomy. Multiple different tip shapes were also employed. Ultimately, no further progress could be made. At this time, typically we would proceed with a TIPS access for combined antegrade and retrograde balloon occluded transvenous obliteration. Unfortunately, we are unable to proceed with tips at this time given that the patient is only sedated. We will have to reschedule with general anesthesia. The catheters and wires were removed. Hemostasis was attained by manual pressure. IMPRESSION: 1. Ultimately unsuccessful balloon occluded retrograde transvenous obliteration of gastric varices secondary to inability to navigate the highly tortuous and capacious gastro renal shunt. 2. Multi selective and balloon occluded venography demonstrates that the if air in flow from the portal venous system is via the left gastric vein. The patient will likely require a TIPS with combined antegrade and retrograde balloon occluded transvenous obliteration of the gastric varices. We will reschedule for TIPS with BATO/BRTO under general anesthesia in the near future. Signed, Sterling Big, MD, RPVI [facility] Interventional Radiology Specialists [facility] Radiology Electronically Signed   By: Malachy Moan M.D.   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 He presents today for an attempted balloon occluded retrograde transvenous obliteration under moderate sedation. EXAM: 1. Right internal jugular venous access with ultrasound guidance 2. Left renal vein catheterization and left renal venogram 3. Catheterization of the common trunk of the gastro renal shunt and left adrenal vein with venogram 4. Catheterization of the left  adrenal vein with venogram 5. Catheterization of the gastro renal shunt with balloon occluded venogram MEDICATIONS: None. ANESTHESIA/SEDATION: A total of 3 mg Versed and 100 mcg fentanyl were administered intravenously. Patient's level of consciousness and vital signs were monitored continuously by radiology nursing under my direct supervision. The total moderate sedated time was 80 minutes. CONTRAST:  90 cc Omnipaque 350 FLUOROSCOPY TIME:  Fluoroscopy Time: 38 minutes 6 seconds (556 mGy). COMPLICATIONS: None immediate. PROCEDURE: Informed written consent was obtained from the patient after a thorough discussion of the procedural risks, benefits and alternatives. All questions were addressed. Maximal Sterile Barrier Technique was utilized including caps, mask, sterile gowns, sterile gloves, sterile drape, hand hygiene and skin antiseptic. A timeout was performed prior to the initiation of the procedure. The right internal jugular vein was interrogated with ultrasound and found to be widely patent. An image was obtained and stored for the medical record. Local anesthesia was attained by infiltration with 1% lidocaine. A small dermatotomy was made. Under real-time sonographic guidance, the vessel was punctured with a 21 gauge micropuncture needle. Using standard  technique, the initial micro needle was exchanged over a 0.018 micro wire for a transitional 4 [place] micro sheath. The micro sheath was then exchanged over a 0.035 wire for a 10 French dilator and the skin tract was dilated. A 10 French TIPS sheath was then advanced over the wire and positioned in the inferior vena cava. A Kumpe the catheter was advanced through the sheath and used to select the left renal vein. The catheter was advanced deep into the left renal vein and a left renal venogram was performed. The left renal vein is widely patent. Inflow artifact from the common trunk of the left adrenal vein and gastro renal shunt is visualized. There is mild  incompetent flow down the left gonadal vein. A superstiff Amplatz wire was advanced into the renal vein. The 10 French sheath was successfully advanced into the proximal renal vein. The 5 French catheter was removed and the wire was left in place. The 5 French catheter was reintroduced through the sheath coaxially next 2 the Amplatz wire. The catheter was used to successfully catheterize the common trunk of the gastro renal shunt and left adrenal vein. Contrast injection was performed confirming the anatomy. The catheter was then advanced and a second venogram was performed. This reveals that the catheter tip is within the left adrenal vein. The catheter was brought back into the common trunk and utilizing a Glidewire, the catheter was successfully navigated into the gastro renal shunt. A venogram was performed confirming this location. The superstiff Amplatz wire was then advanced into the gastro renal shunt. The 5 French catheter was removed. A 9 French Merci balloon occlusion catheter was advanced over the wire and positioned in the gastro renal shunt. The occlusion balloon was inflated and a balloon occluded retrograde venogram was performed of the gastric shunt and gastric varices. The initial segment of the gastro renal shunt is capacious and tortuous. Intravenous contrast could not be refluxed into the portal venous system. Therefore, a carbon dioxide balloon occluded venogram was performed. This demonstrates that the afferent flow of the portosystemic shunt is via the left gastric vein. A high-flow Renegade microcatheter was then advanced coaxially through the [facility] occlusion balloon and into the gastro renal shunt. A significant amount of time was then spent trying to manipulate the catheter deeper into the gastro renal shunt. Unfortunately, the guidewire would only coil in a circular fashion in the initial capacious portion of the gastro renal shunt. A total of 3 different guidewires were tried in an  effort to navigate the anatomy. Multiple different tip shapes were also employed. Ultimately, no further progress could be made. At this time, typically we would proceed with a TIPS access for combined antegrade and retrograde balloon occluded transvenous obliteration. Unfortunately, we are unable to proceed with tips at this time given that the patient is only sedated. We will have to reschedule with general anesthesia. The catheters and wires were removed. Hemostasis was attained by manual pressure. IMPRESSION: 1. Ultimately unsuccessful balloon occluded retrograde transvenous obliteration of gastric varices secondary to inability to navigate the highly tortuous and capacious gastro renal shunt. 2. Multi selective and balloon occluded venography demonstrates that the if air in flow from the portal venous system is via the left gastric vein. The patient will likely require a TIPS with combined antegrade and retrograde balloon occluded transvenous obliteration of the gastric varices. We will reschedule for TIPS with BATO/BRTO under general anesthesia in the near future. Signed, Sterling Big, MD, RPVI Vascular and Interventional  Radiology Specialists [facility] Radiology Electronically Signed   By: Malachy Moan M.D.   On: 03/30/2018 13:07    Labs:  CBC: Recent Labs    03/27/18 1358 03/27/18 2007 03/29/18 0401 03/30/18 0730  WBC 1.4* 1.5* 1.8* 2.0*  HGB 11.8* 11.9* 12.8* 13.0  HCT 36.6* 35.9* 37.8* 39.4  PLT 53* 55* 60* 75*    COAGS: Recent Labs    01/14/18 2348 03/26/18 2020 03/27/18 0241 03/30/18 0730  INR 1.24 1.15  --  1.24  APTT 36  --  34 35    BMP: Recent Labs    01/15/18 0354 03/26/18 2020 03/27/18 0747 03/30/18 0730  NA 141 141 139 135  K 4.7 3.8 4.2 3.6  CL 104 106 105 100  CO2 18* 23 23 25   GLUCOSE 61* 111* 109* 94  BUN 11 7 9 9   CALCIUM 8.8* 8.9 8.3* 9.0  CREATININE 1.04 0.72 0.67 0.81  GFRNONAA >60 >60 >60 >60  GFRAA >60 >60 >60 >60    LIVER  FUNCTION TESTS: Recent Labs    01/16/18 0452 03/26/18 2020 03/27/18 0747 03/30/18 0730  BILITOT 3.4* 1.8* 2.3* 4.0*  AST 85* 148* 110* 240*  ALT 36 55* 45* 111*  ALKPHOS 97 105 85 100  PROT 7.0 8.1 7.3 7.6  ALBUMIN 3.0* 3.5 3.1* 3.1*    TUMOR MARKERS: No results for input(s): AFPTM, CEA, CA199, CHROMGRNA in the last 8760 hours.  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 Unsuccessful BRTO 9/24 Scheduled now for Transjugular Intrahepatic Portal system Shunt placement with possible Balloon Occluded Retrograde Transvenous Obliteration to be performed in IT today with Dr Archer Asa. Risks and benefits of TIPS, BRTO and/or additional variceal embolization were discussed with the patient and/or the patient's family including, but not limited to, infection, bleeding, damage to adjacent structures, worsening hepatic and/or cardiac function, non-target embolization and death.   This interventional procedure involves the use of X-rays and because of the nature of the planned procedure, it is possible that we will have prolonged use of X-ray fluoroscopy.  Potential radiation risks to you include (but are not limited to) the following: - A slightly elevated risk for cancer  several years later in life. This risk is typically less than 0.5% percent. This risk is low in comparison to the normal incidence of human cancer, which is 33% for women and 50% for men according to the [employer] Society. - Radiation induced injury can include skin redness, resembling a rash, tissue breakdown / ulcers and hair loss (which can be temporary or permanent).   The likelihood of either of these occurring depends on the difficulty of the procedure and whether you are sensitive to radiation due to previous procedures, disease, or genetic conditions.   IF your procedure requires a prolonged use of radiation, you will be notified and given  written instructions for further action.  It is your responsibility to monitor the irradiated area for the 2 weeks following the procedure and to notify your physician if you are concerned that you have suffered a radiation induced injury.    All of the patient's questions were answered, patient is agreeable to proceed.  Consent signed and in chart.  Thank you for this interesting consult.  I greatly enjoyed meeting Kipper Buch and look forward to participating in their care.  A copy of this report was sent to the requesting provider on this date.  Electronically Signed: Robet Leu,  PA-C 04/09/2018, 10:57 AM   I spent a total of  40 Minutes   in face to face in clinical consultation, greater than 50% of which was counseling/coordinating care for TIPS/BRTO

## 2018-04-09 NOTE — Anesthesia Procedure Notes (Addendum)
Arterial Line Insertion Start/End10/10/2017 12:05 PM, 04/09/2018 12:10 PM Performed by: Shelton Silvas, MD, anesthesiologist  Patient location: Pre-op. Preanesthetic checklist: patient identified, IV checked, site marked, risks and benefits discussed, surgical consent, monitors and equipment checked, pre-op evaluation, timeout performed and anesthesia consent Lidocaine 1% used for infiltration Right, radial was placed Catheter size: 20 Fr Hand hygiene performed  and maximum sterile barriers used   Attempts: 1 Procedure performed without using ultrasound guided technique. Following insertion, dressing applied. Post procedure assessment: normal and unchanged  Patient tolerated the procedure well with no immediate complications.

## 2018-04-09 NOTE — Transfer of Care (Signed)
Immediate Anesthesia Transfer of Care Note  Patient: Michael Ayers  Procedure(s) Performed: IR TIPS/BRTO (N/A )  Patient Location: PACU  Anesthesia Type:General  Level of Consciousness: awake, oriented and patient cooperative  Airway & Oxygen Therapy: Patient Spontanous Breathing and Patient connected to nasal cannula oxygen  Post-op Assessment: Report given to RN, Post -op Vital signs reviewed and stable and Patient moving all extremities  Post vital signs: Reviewed and stable  Last Vitals:  Vitals Value Taken Time  BP 138/85 04/09/2018  6:53 PM  Temp    Pulse 74 04/09/2018  6:57 PM  Resp 14 04/09/2018  6:57 PM  SpO2 100 % 04/09/2018  6:57 PM  Vitals shown include unvalidated device data.  Last Pain:  Vitals:   04/09/18 1049  TempSrc:   PainSc: 0-No pain      Patients Stated Pain Goal: 0 (04/09/18 1049)  Complications: No apparent anesthesia complications

## 2018-04-09 NOTE — H&P (Deleted)
  The note originally documented on this encounter has been moved the the encounter in which it belongs.  

## 2018-04-09 NOTE — Anesthesia Preprocedure Evaluation (Signed)
Anesthesia Evaluation  Patient identified by MRN, date of birth, ID band Patient awake    Reviewed: Allergy & Precautions, NPO status , Patient's Chart, lab work & pertinent test results, reviewed documented beta blocker date and time   Airway Mallampati: I  TM Distance: >3 FB Neck ROM: Full    Dental  (+) Teeth Intact, Dental Advisory Given   Pulmonary    breath sounds clear to auscultation       Cardiovascular hypertension, Pt. on home beta blockers  Rhythm:Regular Rate:Normal     Neuro/Psych    GI/Hepatic PUD, (+) Cirrhosis   Esophageal Varices    , Hepatitis -  Endo/Other  negative endocrine ROS  Renal/GU negative Renal ROS     Musculoskeletal   Abdominal Normal abdominal exam  (+)   Peds  Hematology   Anesthesia Other Findings   Reproductive/Obstetrics                             Lab Results  Component Value Date   WBC 2.0 (L) 03/30/2018   HGB 13.0 03/30/2018   HCT 39.4 03/30/2018   MCV 93.1 03/30/2018   PLT 75 (L) 03/30/2018   Lab Results  Component Value Date   CREATININE 0.81 03/30/2018   BUN 9 03/30/2018   NA 135 03/30/2018   K 3.6 03/30/2018   CL 100 03/30/2018   CO2 25 03/30/2018   Lab Results  Component Value Date   INR 1.24 03/30/2018   INR 1.15 03/26/2018   INR 1.24 01/14/2018   EKG: normal sinus rhythm.  Echo: - Left ventricle: The cavity size was normal. There was mild   concentric hypertrophy. Systolic function was normal. The   estimated ejection fraction was in the range of 60% to 65%. Wall   motion was normal; there were no regional wall motion   abnormalities. Left ventricular diastolic function parameters   were normal. - Mitral valve: There was trivial regurgitation.   Anesthesia Physical Anesthesia Plan  ASA: III  Anesthesia Plan: General   Post-op Pain Management:    Induction: Intravenous  PONV Risk Score and Plan: 3 and  Ondansetron, Dexamethasone, Midazolam and Treatment may vary due to age or medical condition  Airway Management Planned: Oral ETT  Additional Equipment: Arterial line  Intra-op Plan:   Post-operative Plan: Extubation in OR  Informed Consent: I have reviewed the patients History and Physical, chart, labs and discussed the procedure including the risks, benefits and alternatives for the proposed anesthesia with the patient or authorized representative who has indicated his/her understanding and acceptance.   Dental advisory given  Plan Discussed with: CRNA  Anesthesia Plan Comments: (2 Units RBC's in room. )        Anesthesia Quick Evaluation

## 2018-04-09 NOTE — Anesthesia Procedure Notes (Addendum)
Procedure Name: Intubation Date/Time: 04/09/2018 1:18 PM Performed by: Laruth Bouchard., CRNA Pre-anesthesia Checklist: Patient identified, Emergency Drugs available, Suction available, Patient being monitored and Timeout performed Patient Re-evaluated:Patient Re-evaluated prior to induction Oxygen Delivery Method: Circle system utilized Preoxygenation: Pre-oxygenation with 100% oxygen Induction Type: IV induction Ventilation: Mask ventilation without difficulty Laryngoscope Size: Glidescope and 4 Grade View: Grade I Tube type: Oral Tube size: 7.5 mm Number of attempts: 1 Airway Equipment and Method: Rigid stylet Placement Confirmation: ETT inserted through vocal cords under direct vision,  positive ETCO2 and breath sounds checked- equal and bilateral Secured at: 22 cm Tube secured with: Tape Dental Injury: Teeth and Oropharynx as per pre-operative assessment

## 2018-04-09 NOTE — Procedures (Signed)
Interventional Radiology Procedure Note  Procedure:  1.) TIPS 2.) Sclerosant and coil embolization of varices  Complications: NOne  Estimated Blood Loss: None  Recommendations: - Admit for obs - Lactulose - Labs in am  Signed,  Sterling Big, MD

## 2018-04-10 ENCOUNTER — Other Ambulatory Visit: Payer: Self-pay | Admitting: Radiology

## 2018-04-10 ENCOUNTER — Encounter: Payer: Self-pay | Admitting: Physician Assistant

## 2018-04-10 DIAGNOSIS — I864 Gastric varices: Secondary | ICD-10-CM

## 2018-04-10 LAB — COMPREHENSIVE METABOLIC PANEL
ALBUMIN: 2.7 g/dL — AB (ref 3.5–5.0)
ALK PHOS: 108 U/L (ref 38–126)
ALT: 59 U/L — ABNORMAL HIGH (ref 0–44)
AST: 106 U/L — AB (ref 15–41)
Anion gap: 9 (ref 5–15)
BUN: 8 mg/dL (ref 6–20)
CHLORIDE: 104 mmol/L (ref 98–111)
CO2: 23 mmol/L (ref 22–32)
Calcium: 8.8 mg/dL — ABNORMAL LOW (ref 8.9–10.3)
Creatinine, Ser: 0.86 mg/dL (ref 0.61–1.24)
GFR calc Af Amer: >60 mL/min (ref >60)
GFR calc non Af Amer: >60 mL/min (ref >60)
GLUCOSE: 154 mg/dL — AB (ref 70–99)
POTASSIUM: 3.6 mmol/L (ref 3.5–5.1)
Sodium: 136 mmol/L (ref 135–145)
Total Bilirubin: 2.2 mg/dL — ABNORMAL HIGH (ref 0.3–1.2)
Total Protein: 6.8 g/dL (ref 6.5–8.1)

## 2018-04-10 LAB — PROTIME-INR
INR: 1.29
PROTHROMBIN TIME: 16 s — AB (ref 11.4–15.2)

## 2018-04-10 LAB — CBC
HEMATOCRIT: 38.8 % — AB (ref 39.0–52.0)
HEMOGLOBIN: 12.4 g/dL — AB (ref 13.0–17.0)
MCH: 30.8 pg (ref 26.0–34.0)
MCHC: 32 g/dL (ref 30.0–36.0)
MCV: 96.5 fL (ref 78.0–100.0)
Platelets: 104 10*3/uL — ABNORMAL LOW (ref 150–400)
RBC: 4.02 MIL/uL — AB (ref 4.22–5.81)
RDW: 16.5 % — ABNORMAL HIGH (ref 11.5–15.5)
WBC: 7.4 10*3/uL (ref 4.0–10.5)

## 2018-04-10 MED ORDER — HYDROCODONE-ACETAMINOPHEN 5-325 MG PO TABS: 1.0000 | ORAL_TABLET | ORAL | 0 refills | Status: DC | PRN

## 2018-04-10 MED ORDER — HYDROCODONE-ACETAMINOPHEN 5-325 MG PO TABS: 1.0000 | ORAL_TABLET | Freq: Four times a day (QID) | ORAL | 0 refills | Status: AC | PRN

## 2018-04-10 MED ORDER — LACTULOSE 10 GM/15ML PO SOLN: 20.0000 g | Freq: Three times a day (TID) | ORAL | 0 refills | Status: DC

## 2018-04-10 NOTE — Progress Notes (Signed)
Discontinued indwelling foley catheter three hours ago.Patient had a pee.Discussed his discharged order.Patient able to speak and understand [place].Discharged papers in [place] and [place] versions provided to patient.Printed prescriptions with its matching letter.Questions answered satisfactorily.Discharged without any complaints.

## 2018-04-10 NOTE — Discharge Instructions (Addendum)
Transjugular Intrahepatic Portosystemic Shunt Insertion Transjugular intrahepatic portosystemic shunt (TIPS) insertion is a procedure to restore blood flow through the liver. This procedure may be done when a blockage has caused problems with the normal flow of blood through the liver, resulting in increased pressure (hypertension) in the vein that carries blood to the liver (portal vein). Portal hypertension is usually caused by scar tissue from liver disease, such as cirrhosis. It can cause serious problems. A TIPS procedure may be done if portal hypertension or liver disease has caused problems such as:  Bleeding from the esophagus or stomach.  Buildup of fluid in the abdomen.  In a TIPS procedure, a new pathway for blood flow is created through the liver. A small wire-mesh tube called a shunt or stent is placed inside the pathway to keep it open. The shunt is passed down the jugular vein in your neck. Then, the shunt is inserted between the portal vein and a vein on the other side of the liver (hepatic vein), which is a vein that blood flows through when it leaves the liver. The shunt connects these blood vessels and improves blood flow across the liver. Tell a health care provider about:  Any allergies you have.  All medicines you are taking, including vitamins, herbs, eye drops, creams, and over-the-counter medicines.  Any problems you or family members have had with anesthetic medicines.  Any blood disorders you have.  Any surgeries you have had.  Any medical conditions you have.  Whether you are pregnant or may be pregnant. What are the risks? Generally, this is a safe procedure. However, problems may occur, including:  Allergic reactions to medicines or dyes.  Bleeding into the abdomen.  Irregular heartbeat (cardiac arrhythmia).  Decline in brain function (encephalopathy) in people with severe liver disease. This may be a problem after the TIPS has been inserted, and it may  require treatment. People who have had encephalopathy may not be good candidates for TIPS.  Blockage of the shunt. This may lead to a return of the problems. If this occurs, a new shunt can be placed or the existing shunt can be adjusted.You will have ultrasound exams to evaluate for a blockage of the shunt.  What happens before the procedure? Staying hydrated Follow instructions from your health care provider about hydration, which may include:  Up to 2 hours before the procedure - you may continue to drink clear liquids, such as water, clear fruit juice, black coffee, and plain tea.  Eating and drinking restrictions Follow instructions from your health care provider about eating and drinking, which may include:  8 hours before the procedure - stop eating heavy meals or foods such as meat, fried foods, or fatty foods.  6 hours before the procedure - stop eating light meals or foods, such as toast or cereal.  6 hours before the procedure - stop drinking milk or drinks that contain milk.  2 hours before the procedure - stop drinking clear liquids.  Medicines Ask your health care provider about:  Changing or stopping your regular medicines. This is especially important if you are taking diabetes medicines or blood thinners.  Taking medicines such as aspirin and ibuprofen. These medicines can thin your blood. Do not take these medicines before your procedure if your health care provider instructs you not to.  General instructions  You may have blood tests to see how well your kidneys and liver are working and to see how well your blood can clot.  Plan to  have someone take you home from the hospital or clinic. What happens during the procedure?  To reduce your risk of infection: ? Your health care team will wash or sanitize their hands. ? Your skin will be washed with soap.  An IV tube will be inserted into one of your veins.  You will be given one or more of the following: ? A  medicine to help you relax (sedative). ? A medicine to numb the neck area (local anesthetic). ? A medicine to make you fall asleep (general anesthetic).  A needle will be used to make a small hole in a vein in the right side of your neck. Needles and long, thin, flexible tubes (catheters) will be moved through the hole down to the veins in your liver.  Dye will be injected through a catheter, and X-ray images will be taken. This will help your surgeon see the blood flow around your liver. As the dye passes through the blood vessels in your body, you may experience a warm feeling.  A needle will be inserted across the liver to make a connection with a branch of the portal vein. This channel will be expanded, and the shunt will be inserted and left in place.  The needle and catheter will be removed.  Pressure will be applied to your neck to prevent bleeding. A bandage (dressing) will be applied. The procedure may vary among health care providers and hospitals. What happens after the procedure?  Your blood pressure, heart rate, breathing rate, and blood oxygen level will be monitored until the medicines you were given have worn off.  Your health care provider will watch for signs of complications.  You will be instructed to keep your head raised (elevated) above the level of your heart for a few hours after the procedure. This will help to prevent bleeding from the insertion site in your neck.  You may feel mild stiffness in your neck where the needle was inserted.  An ultrasound may be done the morning after the procedure to make sure that the shunt is open and working well.  Do not drive for 24 hours if you were given a sedative. This information is not intended to replace advice given to you by your health care provider. Make sure you discuss any questions you have with your health care provider. Document Released: 03/22/2003 Document Revised: 04/04/2016 Document Reviewed:  04/04/2016 Elsevier Interactive Patient Education  2018 Elsevier Inc.  Derivacin portosistmica intraheptica transyugular  (Transjugular Intrahepatic Portosystemic Shunt Insertion) Una enfermedad heptica, como la cirrosis, puede producir la formacin de tejido Special educational needs teacher. Este tejido puede obstaculizar el flujo normal de la sangre a travs del hgado e impide el filtrado correcto de la [place]. Para restaurar la circulacin normal a travs del hgado, los mdicos utilizan un procedimiento llamado derivacin portosistmica intraheptica transyugular (DPIT). La DPIT es un procedimiento utilizado para tratar complicaciones de enfermedades hepticas graves, que incluyen:  Hemorragia en el esfago o el [place].  Acumulacin de lquido en el abdomen. Normalmente, la sangre fluye desde el intestino delgado hasta el hgado a travs de la vena porta. Luego sale del hgado a travs de la vena heptica, por donde regresa al corazn. No obstante, debido a que el tejido cicatrizal de la enfermedad heptica obstruye el flujo sanguneo, hay un aumento de la presin en la vena porta (hipertensin portal). La hipertensin portal hace que la sangre circule por el hgado a travs de pequeas venas que se conectan con Walker  venas en el abdomen. Estas venas se agrandan y se las conocen como vrices. Las vrices suelen aparecer fuera del hgado y causan problemas, tales como hemorragias. Dos lugares donde comnmente se forman vrices son Investment banker, corporate y la parte inferior del tubo que lleva los alimentos de la boca al Teaching laboratory technician (esfago). Por su ubicacin, las vrices pueden sangrar y pueden provocar la muerte si no se tratan. El redireccionamiento de la sangre de la vena porta tambin puede producir la acumulacin de lquido en el abdomen (ascitis). En un procedimiento de DPIT, se crea una nueva va (derivacin) para que la sangre circule por el hgado. Esto reduce la hipertensin portal y la formacin de  vrices. La derivacin, tambin llamada stent, es un pequeo tubo de [place] de alambre que se coloca dentro de la va para [place]. Un radilogo pasa la derivacin por la vena yugular en el cuello con la ayuda de una radiografa. Luego se introduce entre la vena porta y Estate agent, y las conecta de cada lado del hgado para mejorar el flujo sanguneo en todo el rgano. INFORME A SU MDICO SOBRE:  Alergias a alimentos o medicamentos.  Medicamentos que [place], incluyendo vitaminas, hierbas, gotas oftlmicas, corticoides, medicamentos de venta libre y cremas.  Problemas previos que usted o los [employer] de su familia hayan tenido con el uso de anestsicos.  Enfermedades de [employer].  Cirugas previas.  Enfermedades. RIESGOS Y COMPLICACIONES Generalmente es un procedimiento seguro. Sin embargo, Tree surgeon procedimiento, pueden surgir complicaciones. Las complicaciones posibles son:  Reacciones a Higher education careers adviser.  Hemorragias en el abdomen.  Latidos irregulares (arritmia cardaca).  Las personas con una enfermedad heptica grave estn en riesgo de deterioro de la funcin cerebral (encefalopata). Esto puede ser Neomia Dear complicacin despus de que se haya insertado la DPIT y Development worker, community. Los pacientes que han tenido encefalopata no son muy buenos candidatos para la DPIT.  Obstruccin de la derivacin. Esto ocurre en la [employer] de los pacientes en el primer ao de la colocacin de la DPIT. Puede provocar la reaparicin de los problemas. Si ocurre, se debe colocar otra derivacin o ajustar la existente.  Muerte. El riesgo se presenta principalmente en el momento del procedimiento por las complicaciones. La muerte tambin es un riesgo American Electric [employer] y Retail buyer posteriores a la colocacin de la derivacin si empeora la insuficiencia heptica. Las Dealer con una enfermedad heptica ms avanzada tienen un riesgo mayor de que la insuficiencia heptica empeore  despus de la colocacin de la DPIT. ANTES DEL PROCEDIMIENTO  El mdico podr indicarle anlisis de [place], si es necesario. Estos anlisis pueden ayudar a Contractor con la que estn funcionando los riones y English as a second language teacher. Tambin pueden determinar la eficacia con la que coagula la [place].  Si toma anticoagulantes, tambin llamados disolventes de la [place], pregntele a su mdico cundo debe interrumpirlos.  No debe comer ni beber nada despus de la medianoche previa al procedimiento.  Segn el procedimiento, posiblemente pueda volver a su casa el mismo da. Su mdico le informar si debe quedarse toda la noche despus del procedimiento. Una vez que lo sepa, pdale a alguna persona que lo lleve a su casa despus del procedimiento. PROCEDIMIENTO   El procedimiento, por lo general, es realizado por radilogos. Suele llevar [employer], [place] puede demorar ms segn la gravedad de la enfermedad heptica.  Le administrarn: ? Un medicamento para adormecer un rea pequea de piel donde se insertar la derivacin (anestesia local). ?  Medicamentos para que se relaje (sedantes). ? Analgsicos. ? Un medicamento que lo har dormir (anestesia general).  Se usar una aguja para realizar una pequea perforacin en una vena del lado derecho del cuello. A continuacin, se introducirn agujas y tubos flexibles largos y delgados (catteres) en la perforacin hasta las venas del hgado.  Se inyectar una sustancia de contraste a travs de un catter y se tomarn imgenes radiogrficas. El mdico Chemical engineer la sustancia de [place] y las radiografas para ver el flujo sanguneo por el hgado. A medida que la sustancia de contraste circula por los vasos sanguneos del cuerpo, puede Futures trader.  Se insertar una aguja por el hgado para realizar una conexin con una ramificacin de la vena porta. Feliberto Harts, se expandir el canal y se introducir el stent para permitir que la sangre circule por  el hgado con mayor facilidad. El stent quedar en Immunologist.  Al finalizar el procedimiento, se aplicar presin en el cuello para evitar una hemorragia. Luego se colocar un apsito. DESPUS DEL PROCEDIMIENTO   Se quedar en el rea de recuperacin hasta que desaparezca el efecto de los sedantes. Durante este tiempo, controlarn de cerca su evolucin.  El mdico estar pendiente por si surgen complicaciones.  La maana siguiente al procedimiento, se realizar una ecografa para verificar que la derivacin est abierta y funcionando bien.  Le pedirn que mantenga la cabeza por encima del nivel del corazn durante las horas posteriores al procedimiento. Esto ayudar a Paramedic de la insercin en el cuello.  Puede sentir [place].  Deber pedirle a alguien que lo lleve a su casa despus del procedimiento por los analgsicos y sedantes que le habrn administrado. Algunas personas pueden USAA da, [place] que otras necesitarn quedarse una noche. Esta informacin no tiene Theme park manager el consejo del mdico. Asegrese de hacerle al mdico cualquier pregunta que tenga. Document Released: 04/13/2013 Document Revised: 11/07/2014 Elsevier Interactive Patient Education  2017 [employer].

## 2018-04-10 NOTE — Discharge Summary (Signed)
Patient ID: Michael Ayers MRN: [id number] DOB/AGE: [birth year] 42 y.o.  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 See procedure note for details. Admitted in stable condition. No overnight issues. Pt feels well POD #1. Tolerating regular diet. Some pain/discomfort at procedure sites. A&O, no encephalopathy. Labs reviewed, stable.  Stable for discharge home. Reviewed all medications, instructions, and follow up plans  Consults: None  Significant Diagnostic Studies: labs:  CMP Latest Ref Rng & Units 04/10/2018 04/09/2018 04/09/2018  Glucose 70 - 99 mg/dL 124(P) - 98  BUN 6 - 20 mg/dL 8 - 5(L)  Creatinine 8.09 - 1.24 mg/dL 9.83 - 3.82  Sodium 505 - 145 mmol/L 136 139 138  Potassium 3.5 - 5.1 mmol/L 3.6 3.7 3.8  Chloride 98 - 111 mmol/L 104 - 109  CO2 22 - 32 mmol/L 23 - 19(L)  Calcium 8.9 - 10.3 mg/dL 3.9(J) - 9.4  Total Protein 6.5 - 8.1 g/dL 6.8 - 7.6  Total Bilirubin 0.3 - 1.2 mg/dL 2.2(H) - 2.3(H)  Alkaline Phos 38 - 126 U/L 108 - 132(H)  AST 15 - 41 U/L 106(H) - 101(H)  ALT 0 - 44 U/L 59(H) - 56(H)    Discharge Exam: Blood pressure (!) 151/84, pulse (!) 55, temperature 97.8 F (36.6 C), temperature source Oral, resp. rate 16, height 5\' 8"  (1.727 m), weight 72.9 kg, SpO2 99 %. General: NAD, A&O x 3, no encephalopathy Neck: (R)IJ access site clean, dry, no hematoma Lungs: CTA without w/r/r Heart: Regular Abdomen: soft, NT. Transhepatic access site dry, no hematoma Ext: (R)groin access site dry, no hematoma:   Disposition: Discharge disposition: 01-Home or  Self Care       Discharge Instructions    Call MD for:  difficulty breathing, headache or visual disturbances   Complete by:  As directed    Call MD for:  persistant nausea and vomiting   Complete by:  As directed    Call MD for:  redness, tenderness, or signs of infection (pain, swelling, redness, odor or green/yellow discharge around incision site)   Complete by:  As directed    Call MD for:  severe uncontrolled pain   Complete by:  As directed    Call MD for:  temperature >100.4   Complete by:  As directed    Diet general   Complete by:  As directed    Driving Restrictions   Complete by:  As directed    May not drive while taking narcotic pain medications.   Increase activity slowly   Complete by:  As directed    Other Restrictions   Complete by:  As directed    Okay to Return to Work as of Monday 10/7   Remove dressing in 24 hours   Complete by:  As directed      Allergies as of 04/10/2018   No Known Allergies     Medication List    TAKE these medications   folic acid 1 MG tablet Commonly known as:  FOLVITE Take 1 tablet (1 mg total) by mouth daily.   HYDROcodone-acetaminophen 5-325 MG tablet Commonly known as:  NORCO/VICODIN Take 1-2 tablets by mouth  every 4 (four) hours as needed for up to 5 days for moderate pain.   lactulose 10 GM/15ML solution Commonly known as:  CHRONULAC Take 30 mLs (20 g total) by mouth 3 (three) times daily.   multivitamin with minerals Tabs tablet Take 1 tablet by mouth daily.   nadolol 20 MG tablet Commonly known as:  CORGARD Take 1 tablet (20 mg total) by mouth daily.      Follow-up Information    Malachy Moan, MD. Go in 4 week(s).   Specialties:  Interventional Radiology, Radiology Why:  Clinic will call you to schedule follow up Contact information: [address] [place] [address] [phone]            Electronically Signed: Brayton El, PA-C 04/10/2018, 8:54 AM   I have spent  Less Than 30 Minutes discharging Ray Church.

## 2018-04-10 NOTE — Care Management (Signed)
Spoke with patient regarding filling prescriptions.  Prescriptions escribed to [facility] pharmacy and also handwritten. Pt given GoodRX coupon for Lactulose to bring cost down to less than $10 at [employer].  Vicodin is $14.22 at [facility], but may be cheaper at [facility]. Pt said he could afford up to $30 for prescriptions.

## 2018-04-12 ENCOUNTER — Encounter (HOSPITAL_COMMUNITY): Payer: Self-pay | Admitting: Interventional Radiology

## 2018-04-12 LAB — TYPE AND SCREEN
ABO/RH(D): AB POS
ANTIBODY SCREEN: NEGATIVE
Unit division: 0
Unit division: 0

## 2018-04-12 LAB — BPAM RBC
BLOOD PRODUCT EXPIRATION DATE: 201911042359
Blood Product Expiration Date: 201910122359
ISSUE DATE / TIME: 201910041154
ISSUE DATE / TIME: 201910041154
UNIT TYPE AND RH: 8400
Unit Type and Rh: 8400

## 2018-04-14 ENCOUNTER — Other Ambulatory Visit (HOSPITAL_COMMUNITY): Payer: Self-pay | Admitting: Interventional Radiology

## 2018-04-14 DIAGNOSIS — I864 Gastric varices: Secondary | ICD-10-CM

## 2018-04-15 NOTE — Anesthesia Postprocedure Evaluation (Signed)
Anesthesia Post Note  Patient: Mel Langan  Procedure(s) Performed: IR TIPS/BRTO (N/A )     Patient location during evaluation: PACU Anesthesia Type: General Level of consciousness: awake and patient cooperative Pain management: pain level controlled Vital Signs Assessment: post-procedure vital signs reviewed and stable Respiratory status: spontaneous breathing, nonlabored ventilation, respiratory function stable and patient connected to nasal cannula oxygen Cardiovascular status: blood pressure returned to baseline and stable Postop Assessment: no apparent nausea or vomiting Anesthetic complications: no    Last Vitals:  Vitals:   04/10/18 0618 04/10/18 0900  BP: (!) 151/84 (!) 150/79  Pulse: (!) 55 66  Resp: 16 18  Temp: 36.6 C 36.6 C  SpO2: 99% 100%    Last Pain:  Vitals:   04/10/18 0900  TempSrc: Oral  PainSc:                  Lashondra Vaquerano

## 2018-04-17 ENCOUNTER — Emergency Department (HOSPITAL_COMMUNITY)
Admission: EM | Admit: 2018-04-17 | Discharge: 2018-04-18 | Disposition: A | Discharge status: Home / Self Care | Payer: Self-pay | Attending: Emergency Medicine | Admitting: Emergency Medicine

## 2018-04-17 ENCOUNTER — Encounter (HOSPITAL_COMMUNITY): Payer: Self-pay | Admitting: Emergency Medicine

## 2018-04-17 DIAGNOSIS — I10 Essential (primary) hypertension: Secondary | ICD-10-CM | POA: Insufficient documentation

## 2018-04-17 DIAGNOSIS — K610 Anal abscess: Secondary | ICD-10-CM | POA: Insufficient documentation

## 2018-04-17 DIAGNOSIS — Z79899 Other long term (current) drug therapy: Secondary | ICD-10-CM | POA: Insufficient documentation

## 2018-04-17 HISTORY — PX: INCISION AND DRAINAGE ABSCESS ANAL: SUR669

## 2018-04-17 LAB — URINALYSIS, ROUTINE W REFLEX MICROSCOPIC
Bilirubin Urine: NEGATIVE
Glucose, UA: NEGATIVE mg/dL
HGB URINE DIPSTICK: NEGATIVE
Ketones, ur: NEGATIVE mg/dL
LEUKOCYTES UA: NEGATIVE
Nitrite: NEGATIVE
PROTEIN: NEGATIVE mg/dL
SPECIFIC GRAVITY, URINE: 1.003 — AB (ref 1.005–1.030)
pH: 7 (ref 5.0–8.0)

## 2018-04-17 LAB — BASIC METABOLIC PANEL
ANION GAP: 7 (ref 5–15)
BUN: 5 mg/dL — AB (ref 6–20)
CHLORIDE: 105 mmol/L (ref 98–111)
CO2: 23 mmol/L (ref 22–32)
Calcium: 8.7 mg/dL — ABNORMAL LOW (ref 8.9–10.3)
Creatinine, Ser: 0.93 mg/dL (ref 0.61–1.24)
GFR calc Af Amer: >60 mL/min (ref >60)
GFR calc non Af Amer: >60 mL/min (ref >60)
Glucose, Bld: 104 mg/dL — ABNORMAL HIGH (ref 70–99)
POTASSIUM: 4.2 mmol/L (ref 3.5–5.1)
SODIUM: 135 mmol/L (ref 135–145)

## 2018-04-17 MED ORDER — LIDOCAINE HCL URETHRAL/MUCOSAL 2 % EX GEL
1.0000 "application " | Freq: Once | CUTANEOUS | Status: AC
  Administered 2018-04-17: 1 via TOPICAL
  Filled 2018-04-17: qty 20

## 2018-04-17 NOTE — ED Triage Notes (Signed)
Pt presents with rectal pain x 3 days, reports boil/abscess, not draining, pain with BMs; also reporting groin pain with urination and subjective fevers

## 2018-04-17 NOTE — ED Provider Notes (Signed)
[facility] EMERGENCY DEPARTMENT Provider Note   CSN: [id number] Arrival date & time: 04/17/18  1831     History   Chief Complaint Chief Complaint  Patient presents with  . rectal pain  . Groin Pain  . abscess    HPI Laiken Nohr is a 42 y.o. male.  42 y/o male presents to the ED for complaints of rectal pain x3 days.  States that pain is present to the right of his rectal region.  Pain has been worsening.  He describes a throbbing pressure sensation which is worse with defecation, straining, sitting.  He also reports some burning dysuria since onset of his rectal discomfort.  Does have a history of abscesses in the past, but these have usually spontaneously drained.  Has been taking Tylenol for symptoms without relief.  No fevers, abdominal pain, nausea, vomiting, trauma to the affected area.  The history is provided by the patient. A language interpreter was used.    Past Medical History:  Diagnosis Date  . Alcohol abuse   . Duodenitis   . Esophageal varices (HCC)   . Essential hypertension   . Perforated peptic ulcer (HCC)     Patient Active Problem List   Diagnosis Date Noted  . 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 Anemia 03/29/2018  . Abdominal pain 03/27/2018  . Abnormal LFTs 01/15/2018  . Thrombocytopenia (HCC) 01/15/2018  . Hematemesis 01/14/2018  . Alcohol abuse   . Esophageal varices (HCC)   . Duodenitis   . Essential hypertension     Past Surgical History:  Procedure Laterality Date  . ESOPHAGOGASTRODUODENOSCOPY (EGD) WITH PROPOFOL N/A 01/16/2018   Procedure: ESOPHAGOGASTRODUODENOSCOPY (EGD) WITH PROPOFOL;  Surgeon: Vida Rigger, MD;  Location: [facility] ENDOSCOPY;  Service: Endoscopy;  Laterality: N/A;  . ESOPHAGOGASTRODUODENOSCOPY (EGD) WITH PROPOFOL N/A 03/27/2018   Procedure: ESOPHAGOGASTRODUODENOSCOPY (EGD) WITH PROPOFOL;  Surgeon: Carman Ching, MD;  Location: [facility] ENDOSCOPY;  Service: Endoscopy;   Laterality: N/A;  . IR ANGIOGRAM FOLLOW UP STUDY  04/09/2018  . IR ANGIOGRAM SELECTIVE EACH ADDITIONAL VESSEL  03/30/2018  . IR ANGIOGRAM SELECTIVE EACH ADDITIONAL VESSEL  03/30/2018  . IR ANGIOGRAM SELECTIVE EACH ADDITIONAL VESSEL  04/09/2018  . IR ANGIOGRAM SELECTIVE EACH ADDITIONAL VESSEL  04/09/2018  . IR ANGIOGRAM SELECTIVE EACH ADDITIONAL VESSEL  04/09/2018  . IR ANGIOGRAM SELECTIVE EACH ADDITIONAL VESSEL  04/09/2018  . IR EMBO ART  VEN HEMORR LYMPH EXTRAV  INC GUIDE ROADMAPPING  04/09/2018  . IR TIPS  04/09/2018  . IR US GUIDE VASC ACCESS RIGHT  03/30/2018  . IR US GUIDE VASC ACCESS RIGHT  04/09/2018  . IR US GUIDE VASC ACCESS RIGHT  04/09/2018  . IR US GUIDE VASC ACCESS RIGHT  04/09/2018  . IR VENOGRAM ADRENAL UNI LEFT  03/30/2018  . IR VENOGRAM RENAL UNI LEFT  03/30/2018  . IR VENOGRAM RENAL UNI LEFT  04/09/2018  . Perforated peptic ulcer    . RADIOLOGY WITH ANESTHESIA N/A 04/09/2018   Procedure: IR TIPS/BRTO;  Surgeon: Malachy Moan, MD;  Location: [facility] OR;  Service: Radiology;  Laterality: N/A;        Home Medications    Prior to Admission medications   Medication Sig Start Date End Date Taking? Authorizing Provider  acetaminophen (TYLENOL) 500 MG tablet Take 500 mg by mouth every 6 (six) hours as needed for mild pain.   Yes [provider]  folic acid (FOLVITE) 1 MG tablet Take 1 tablet (1 mg total) by mouth daily. 03/31/18  Yes Laverna Peace, MD  lactulose (CHRONULAC) 10 GM/15ML solution Take 30 mLs (20 g total) by mouth 3 (three) times daily. 04/10/18  Yes Bruning, Caryn Bee, PA-C  Multiple Vitamin (MULTIVITAMIN WITH MINERALS) TABS tablet Take 1 tablet by mouth daily. 03/31/18  Yes Roberto Scales D, MD  nadolol (CORGARD) 20 MG tablet Take 1 tablet (20 mg total) by mouth daily. 03/30/18  Yes Roberto Scales D, MD  cephALEXin (KEFLEX) 500 MG capsule Take 1 capsule (500 mg total) by mouth 4 (four) times daily. 04/18/18   Antony Madura, PA-C  docusate sodium (COLACE) 100 MG capsule  Take 1 capsule (100 mg total) by mouth 2 (two) times daily as needed for mild constipation or moderate constipation (To promote soft stool.). 04/18/18   Antony Madura, PA-C  ibuprofen (ADVIL,MOTRIN) 600 MG tablet Take 1 tablet (600 mg total) by mouth every 6 (six) hours as needed. 04/18/18   Antony Madura, PA-C  sulfamethoxazole-trimethoprim (BACTRIM DS,SEPTRA DS) 800-160 MG tablet Take 1 tablet by mouth 2 (two) times daily for 7 days. 04/18/18 04/25/18  Antony Madura, PA-C    Family History Family History  Problem Relation Age of Onset  . Diabetes Mellitus II Mother     Social History Social History   Tobacco Use  . Smoking status: Never Smoker  . Smokeless tobacco: Never Used  Substance Use Topics  . Alcohol use: Yes    Alcohol/week: 4.0 - 5.0 standard drinks    Types: 4 - 5 Cans of beer per week    Comment: hasn't drank for a few days 04/09/18  . Drug use: Never     Allergies   Patient has no known allergies.   Review of Systems Review of Systems Ten systems reviewed and are negative for acute change, except as noted in the HPI.    Physical Exam Updated Vital Signs BP 135/75 (BP Location: Left Arm)   Pulse 65   Temp 98.8 F (37.1 C) (Oral)   Resp 17   SpO2 100%   Physical Exam  Constitutional: He is oriented to person, place, and time. He appears well-developed and well-nourished. No distress.  Nontoxic appearing and in NAD  HENT:  Head: Normocephalic and atraumatic.  Eyes: Conjunctivae and EOM are normal. No scleral icterus.  Neck: Normal range of motion.  Cardiovascular: Normal rate, regular rhythm and intact distal pulses.  Pulmonary/Chest: Effort normal. No stridor. No respiratory distress. He has no wheezes.  Respirations even and unlabored.   Abdominal: Soft.  Genitourinary:     Genitourinary Comments: +abscess  Musculoskeletal: Normal range of motion.  Neurological: He is alert and oriented to person, place, and time. He exhibits normal muscle tone.  Coordination normal.  Skin: Skin is warm and dry. No rash noted. He is not diaphoretic. No erythema. No pallor.  Psychiatric: He has a normal mood and affect. His behavior is normal.  Nursing note and vitals reviewed.    ED Treatments / Results  Labs (all labs ordered are listed, but only abnormal results are displayed) Labs Reviewed  CBC WITH DIFFERENTIAL/PLATELET - Abnormal; Notable for the following components:      Result Value   RBC 4.19 (*)    Hemoglobin 12.2 (*)    RDW 16.0 (*)    Platelets 106 (*)    All other components within normal limits  BASIC METABOLIC PANEL - Abnormal; Notable for the following components:   Glucose, Bld 104 (*)    BUN 5 (*)    Calcium 8.7 (*)  All other components within normal limits  URINALYSIS, ROUTINE W REFLEX MICROSCOPIC - Abnormal; Notable for the following components:   Specific Gravity, Urine 1.003 (*)    All other components within normal limits  URINE CULTURE    EKG None  Radiology Ct Pelvis W Contrast  Result Date: 04/18/2018 CLINICAL DATA:  Rectal abscess for 3 days. EXAM: CT PELVIS WITH CONTRAST TECHNIQUE: Multidetector CT imaging of the pelvis was performed using the standard protocol following the bolus administration of intravenous contrast. CONTRAST:  OMNIPAQUE IOHEXOL 300 MG/ML  SOLN COMPARISON:  None. FINDINGS: Urinary Tract: Bladder is unremarkable. No wall thickening or filling defects. Bowel: Visualized small and large bowel are not abnormally distended. No wall thickening or inflammatory features are demonstrated. Appendix is normal. Vascular/Lymphatic: Calcification of the distal aorta. No aneurysm or occlusion demonstrated in the iliac vessels. Reproductive:  Prostate gland is not enlarged. Other: No free air or free fluid in the pelvis. There is a right inferior perianal abscess with thick enhancing wall measuring 3.8 cm in diameter. Mild surrounding fatty infiltration suggesting cellulitis. Musculoskeletal: No  suspicious bone lesions identified. IMPRESSION: Right inferior perianal abscess with thick enhancing wall measuring 3.8 cm in diameter. Mild surrounding cellulitis. Electronically Signed   By: Burman Nieves M.D.   On: 04/18/2018 00:41    Procedures Procedures (including critical care time)  INCISION AND DRAINAGE Performed by: Antony Madura Consent: Verbal consent obtained. Risks and benefits: risks, benefits and alternatives were discussed Type: abscess  Body area: Perianal  Anesthesia: local infiltration  Incision was made with a scalpel.  Local anesthetic: lidocaine 1% with epinephrine  Anesthetic total: 2 ml  Complexity: complex Blunt dissection to break up loculations  Drainage: purulent and bloody  Drainage amount: approx 3cc  Packing material: none  Patient tolerance: Patient tolerated the procedure well with no immediate complications.     Medications Ordered in ED Medications  lidocaine (XYLOCAINE) 2 % jelly 1 application (1 application Topical Given 04/17/18 2327)  iohexol (OMNIPAQUE) 300 MG/ML solution 100 mL (100 mLs Intravenous Contrast Given 04/18/18 0012)  lidocaine-EPINEPHrine (XYLOCAINE W/EPI) 2 %-1:200000 (PF) injection 10 mL (10 mLs Other Given 04/18/18 0107)  ketorolac (TORADOL) 30 MG/ML injection 30 mg (30 mg Intravenous Given 04/18/18 0101)  sulfamethoxazole-trimethoprim (BACTRIM DS,SEPTRA DS) 800-160 MG per tablet 1 tablet (1 tablet Oral Given 04/18/18 0300)  cephALEXin (KEFLEX) capsule 500 mg (500 mg Oral Given 04/18/18 0300)     Initial Impression / Assessment and Plan / ED Course  I have reviewed the triage vital signs and the nursing notes.  Pertinent labs & imaging results that were available during my care of the patient were reviewed by me and considered in my medical decision making (see chart for details).     Patient with skin abscess amenable to incision and drainage.  Wound recheck in 2 days advised. Encouraged home warm soaks  and flushing.  Will discharge with Bactrim and Keflex.  Return precautions discussed and provided. Patient discharged in stable condition with no unaddressed concerns.   Final Clinical Impressions(s) / ED Diagnoses   Final diagnoses:  Perianal abscess    ED Discharge Orders         Ordered    cephALEXin (KEFLEX) 500 MG capsule  4 times daily     04/18/18 0256    sulfamethoxazole-trimethoprim (BACTRIM DS,SEPTRA DS) 800-160 MG tablet  2 times daily     04/18/18 0256    ibuprofen (ADVIL,MOTRIN) 600 MG tablet  Every 6 hours PRN  04/18/18 0256    docusate sodium (COLACE) 100 MG capsule  2 times daily PRN     04/18/18 0256           Antony Madura, PA-C 04/18/18 0331    Terrilee Files, MD 04/18/18 1024

## 2018-04-18 ENCOUNTER — Emergency Department (HOSPITAL_COMMUNITY): Payer: Self-pay

## 2018-04-18 ENCOUNTER — Encounter (HOSPITAL_COMMUNITY): Payer: Self-pay | Admitting: Radiology

## 2018-04-18 LAB — CBC WITH DIFFERENTIAL/PLATELET
ABS IMMATURE GRANULOCYTES: 0.04 10*3/uL (ref 0.00–0.07)
Basophils Absolute: 0 10*3/uL (ref 0.0–0.1)
Basophils Relative: 0 %
EOS PCT: 2 %
Eosinophils Absolute: 0.2 10*3/uL (ref 0.0–0.5)
HCT: 39.8 % (ref 39.0–52.0)
HEMOGLOBIN: 12.2 g/dL — AB (ref 13.0–17.0)
IMMATURE GRANULOCYTES: 1 %
LYMPHS ABS: 1.3 10*3/uL (ref 0.7–4.0)
Lymphocytes Relative: 18 %
MCH: 29.1 pg (ref 26.0–34.0)
MCHC: 30.7 g/dL (ref 30.0–36.0)
MCV: 95 fL (ref 80.0–100.0)
MONOS PCT: 14 %
Monocytes Absolute: 1 10*3/uL (ref 0.1–1.0)
Neutro Abs: 4.5 10*3/uL (ref 1.7–7.7)
Neutrophils Relative %: 65 %
Platelets: 106 10*3/uL — ABNORMAL LOW (ref 150–400)
RBC: 4.19 MIL/uL — AB (ref 4.22–5.81)
RDW: 16 % — ABNORMAL HIGH (ref 11.5–15.5)
WBC: 7 10*3/uL (ref 4.0–10.5)
nRBC: 0 % (ref 0.0–0.2)

## 2018-04-18 MED ORDER — LIDOCAINE-EPINEPHRINE (PF) 2 %-1:200000 IJ SOLN
INTRAMUSCULAR | Status: AC
  Filled 2018-04-18: qty 20

## 2018-04-18 MED ORDER — IBUPROFEN 600 MG PO TABS: 600.0000 mg | ORAL_TABLET | Freq: Four times a day (QID) | ORAL | 0 refills | Status: DC | PRN

## 2018-04-18 MED ORDER — CEPHALEXIN 250 MG PO CAPS
500.0000 mg | ORAL_CAPSULE | Freq: Once | ORAL | Status: AC
  Administered 2018-04-18: 500 mg via ORAL
  Filled 2018-04-18: qty 2

## 2018-04-18 MED ORDER — SULFAMETHOXAZOLE-TRIMETHOPRIM 800-160 MG PO TABS: 1.0000 | ORAL_TABLET | Freq: Two times a day (BID) | ORAL | 0 refills | Status: AC

## 2018-04-18 MED ORDER — IOHEXOL 300 MG/ML  SOLN
100.0000 mL | Freq: Once | INTRAMUSCULAR | Status: AC | PRN
  Administered 2018-04-18: 100 mL via INTRAVENOUS

## 2018-04-18 MED ORDER — DOCUSATE SODIUM 100 MG PO CAPS: 100.0000 mg | ORAL_CAPSULE | Freq: Two times a day (BID) | ORAL | 0 refills | Status: DC | PRN

## 2018-04-18 MED ORDER — LIDOCAINE-EPINEPHRINE (PF) 2 %-1:200000 IJ SOLN
10.0000 mL | Freq: Once | INTRAMUSCULAR | Status: AC
  Administered 2018-04-18: 10 mL

## 2018-04-18 MED ORDER — LIDOCAINE HCL (PF) 1 % IJ SOLN
INTRAMUSCULAR | Status: AC
  Filled 2018-04-18: qty 5

## 2018-04-18 MED ORDER — SULFAMETHOXAZOLE-TRIMETHOPRIM 800-160 MG PO TABS
1.0000 | ORAL_TABLET | Freq: Once | ORAL | Status: AC
  Administered 2018-04-18: 1 via ORAL
  Filled 2018-04-18: qty 1

## 2018-04-18 MED ORDER — CEPHALEXIN 500 MG PO CAPS: 500.0000 mg | ORAL_CAPSULE | Freq: Four times a day (QID) | ORAL | 0 refills | Status: DC

## 2018-04-18 MED ORDER — KETOROLAC TROMETHAMINE 30 MG/ML IJ SOLN
30.0000 mg | Freq: Once | INTRAMUSCULAR | Status: AC
  Administered 2018-04-18: 30 mg via INTRAVENOUS
  Filled 2018-04-18: qty 1

## 2018-04-18 NOTE — Discharge Instructions (Addendum)
Take Bactrim and Keflex as prescribed until finished. Keep the area clean by changing your dressing frequently. We also recommend frequent warm soaks or compresses. Follow up for wound recheck in 2 days. This can be followed by a primary care doctor, but if you do not have a primary doctor return to the ED for repeat wound evaluation. You may return sooner for new or concerning symptoms.  ----  Tome Bactrim y Keflex segn lo prescrito hasta que haya terminado. Mantenga el rea limpia cambiando su apsito con frecuencia. Tambin recomendamos remojos o compresas calientes frecuentes. Seguimiento para la recomprobacin de la herida en 2 das. Esto puede ser seguido por un mdico de atencin primaria, pero si usted no tiene un mdico de Teacher, early years/pre al ED para la evaluacin de heridas repetidas. Es posible que vuelvas antes para los sntomas nuevos o relacionados.

## 2018-04-19 LAB — URINE CULTURE: Culture: NO GROWTH

## 2018-05-20 ENCOUNTER — Ambulatory Visit
Admission: RE | Admit: 2018-05-20 | Discharge: 2018-05-20 | Disposition: A | Discharge status: Home / Self Care | Payer: Self-pay | Source: Ambulatory Visit | Attending: Interventional Radiology | Admitting: Interventional Radiology

## 2018-05-20 ENCOUNTER — Encounter: Payer: Self-pay | Admitting: Radiology

## 2018-05-20 DIAGNOSIS — I864 Gastric varices: Secondary | ICD-10-CM

## 2018-05-20 HISTORY — PX: IR RADIOLOGIST EVAL & MGMT: IMG5224

## 2018-05-20 NOTE — Progress Notes (Signed)
Chief Complaint: Patient was seen in follow-up today for TIPS at the request of Dejanae Helser  Referring Physician(s): Kember Boch  History of Present Illness: Michael Ayers is a 42 y.o. male with a history of alcoholic cirrhosis comp located by bleeding gastroesophageal varices.  During his last hospital admission for hematemesis, he underwent creation of a TIPS with combined antegrade and retrograde balloon occluded transvenous obliteration of his gastric varices as well as coil embolization of the left and posterior gastric veins.  His postoperative course was uncomplicated and he was discharged home in good condition on lactulose and nadolol.  He presents today for follow-up.  We have an interpreter with [place].  Michael Ayers is doing well.  His appetite has returned and he is eating well and gaining weight.  He denies any further episodes of hematemesis, hematochezia or melena.  He has minimal intermittent abdominal pain which does not seem to bother him.  He denies chest pain, shortness of breath, fever or chills.  No abdominal distention.  He has not been taking his lactulose.  He denies symptoms of encephalopathy including weakness, dizziness, confusion.  Additionally, he has not been taking his nadolol as he finds it very expensive.  He plans on calling the prescribing physician to see if a cheaper alternative is available.    Past Medical History:  Diagnosis Date  . Alcohol abuse   . Duodenitis   . Esophageal varices (HCC)   . Essential hypertension   . Perforated peptic ulcer (HCC)     Past Surgical History:  Procedure Laterality Date  . ESOPHAGOGASTRODUODENOSCOPY (EGD) WITH PROPOFOL N/A 01/16/2018   Procedure: ESOPHAGOGASTRODUODENOSCOPY (EGD) WITH PROPOFOL;  Surgeon: Vida Rigger, MD;  Location: [facility] ENDOSCOPY;  Service: Endoscopy;  Laterality: N/A;  . ESOPHAGOGASTRODUODENOSCOPY (EGD) WITH PROPOFOL N/A 03/27/2018   Procedure: ESOPHAGOGASTRODUODENOSCOPY (EGD) WITH  PROPOFOL;  Surgeon: Carman Ching, MD;  Location: [facility] ENDOSCOPY;  Service: Endoscopy;  Laterality: N/A;  . IR ANGIOGRAM FOLLOW UP STUDY  04/09/2018  . IR ANGIOGRAM SELECTIVE EACH ADDITIONAL VESSEL  03/30/2018  . IR ANGIOGRAM SELECTIVE EACH ADDITIONAL VESSEL  03/30/2018  . IR ANGIOGRAM SELECTIVE EACH ADDITIONAL VESSEL  04/09/2018  . IR ANGIOGRAM SELECTIVE EACH ADDITIONAL VESSEL  04/09/2018  . IR ANGIOGRAM SELECTIVE EACH ADDITIONAL VESSEL  04/09/2018  . IR ANGIOGRAM SELECTIVE EACH ADDITIONAL VESSEL  04/09/2018  . IR EMBO ART  VEN HEMORR LYMPH EXTRAV  INC GUIDE ROADMAPPING  04/09/2018  . IR RADIOLOGIST EVAL & MGMT  05/20/2018  . IR TIPS  04/09/2018  . IR US GUIDE VASC ACCESS RIGHT  03/30/2018  . IR US GUIDE VASC ACCESS RIGHT  04/09/2018  . IR US GUIDE VASC ACCESS RIGHT  04/09/2018  . IR US GUIDE VASC ACCESS RIGHT  04/09/2018  . IR VENOGRAM ADRENAL UNI LEFT  03/30/2018  . IR VENOGRAM RENAL UNI LEFT  03/30/2018  . IR VENOGRAM RENAL UNI LEFT  04/09/2018  . Perforated peptic ulcer    . RADIOLOGY WITH ANESTHESIA N/A 04/09/2018   Procedure: IR TIPS/BRTO;  Surgeon: Malachy Moan, MD;  Location[facility] OR;  Service: Radiology;  Laterality: N/A;    Allergies: Patient has no known allergies.  Medications: Prior to Admission medications   Medication Sig Start Date End Date Taking? Authorizing Provider  acetaminophen (TYLENOL) 500 MG tablet Take 500 mg by mouth every 6 (six) hours as needed for mild pain.   Yes [provider]  folic acid (FOLVITE) 1 MG tablet Take 1 tablet (1 mg total) by mouth daily. 03/31/18  Yes Laverna PeaceNettey, Shayla D, MD  ibuprofen (ADVIL,MOTRIN) 600 MG tablet Take 1 tablet (600 mg total) by mouth every 6 (six) hours as needed. 04/18/18  Yes Antony MaduraHumes, Kelly, PA-C  Multiple Vitamin (MULTIVITAMIN WITH MINERALS) TABS tablet Take 1 tablet by mouth daily. 03/31/18  Yes Roberto ScalesNettey, Shayla D, MD  lactulose (CHRONULAC) 10 GM/15ML solution Take 30 mLs (20 g total) by mouth 3 (three) times daily. Patient not  taking: Reported on 05/20/2018 04/10/18   Brayton ElBruning, Kevin, PA-C  nadolol (CORGARD) 20 MG tablet Take 1 tablet (20 mg total) by mouth daily. Patient not taking: Reported on 05/20/2018 03/30/18   Laverna PeaceNettey, Shayla D, MD     Family History  Problem Relation Age of Onset  . Diabetes Mellitus II Mother     Social History   Socioeconomic History  . Marital status: Single    Spouse name: Not on file  . Number of children: Not on file  . Years of education: Not on file  . Highest education level: Not on file  Occupational History  . Not on file  Social Needs  . Financial resource strain: Not on file  . Food insecurity:    Worry: Not on file    Inability: Not on file  . Transportation needs:    Medical: Not on file    Non-medical: Not on file  Tobacco Use  . Smoking status: Never Smoker  . Smokeless tobacco: Never Used  Substance and Sexual Activity  . Alcohol use: Yes    Alcohol/week: 4.0 - 5.0 standard drinks    Types: 4 - 5 Cans of beer per week    Comment: hasn't drank for a few days 04/09/18  . Drug use: Never  . Sexual activity: Yes  Lifestyle  . Physical activity:    Days per week: Not on file    Minutes per session: Not on file  . Stress: Not on file  Relationships  . Social connections:    Talks on phone: Not on file    Gets together: Not on file    Attends religious service: Not on file    Active member of club or organization: Not on file    Attends meetings of clubs or organizations: Not on file    Relationship status: Not on file  Other Topics Concern  . Not on file  Social History Narrative  . Not on file    Review of Systems: A 12 point ROS discussed and pertinent positives are indicated in the HPI above.  All other systems are negative.  Review of Systems  Vital Signs: BP 131/73   Pulse 66   Temp 98.3 F (36.8 C) (Oral)   Resp 15   Ht 5\' 9"  (1.753 m)   Wt 78.8 kg   SpO2 100%   BMI 25.67 kg/m   Physical Exam  Constitutional: He is oriented to  person, place, and time. He appears well-developed and well-nourished. No distress.  HENT:  Head: Normocephalic and atraumatic.  Eyes: No scleral icterus.  Cardiovascular: Normal rate.  Pulmonary/Chest: Effort normal.  Abdominal: Soft. He exhibits no distension. There is no tenderness.  Neurological: He is alert and oriented to person, place, and time.  Skin: Skin is warm and dry.  Psychiatric: He has a normal mood and affect. His behavior is normal.  Nursing note and vitals reviewed.    Imaging: Ir Radiologist Eval & Mgmt  Result Date: 05/20/2018 Please refer to notes tab for details about interventional procedure. (Op Note)  Labs:  CBC: Recent Labs    03/30/18 0730 04/09/18 1041 04/09/18 1506 04/10/18 0606 04/17/18 2314  WBC 2.0* 3.4*  --  7.4 7.0  HGB 13.0 16.8 11.9* 12.4* 12.2*  HCT 39.4 53.2* 35.0* 38.8* 39.8  PLT 75* 82*  --  104* 106*    COAGS: Recent Labs    01/14/18 2348 03/26/18 2020 03/27/18 0241 03/30/18 0730 04/09/18 1041 04/10/18 0606  INR 1.24 1.15  --  1.24 1.16 1.29  APTT 36  --  34 35  --   --     BMP: Recent Labs    03/30/18 0730 04/09/18 1041 04/09/18 1506 04/10/18 0606 04/17/18 2314  NA 135 138 139 136 135  K 3.6 3.8 3.7 3.6 4.2  CL 100 109  --  104 105  CO2 25 19*  --  23 23  GLUCOSE 94 98  --  154* 104*  BUN 9 5*  --  8 5*  CALCIUM 9.0 9.4  --  8.8* 8.7*  CREATININE 0.81 0.95  --  0.86 0.93  GFRNONAA >60 >60  --  >60 >60  GFRAA >60 >60  --  >60 >60    LIVER FUNCTION TESTS: Recent Labs    03/27/18 0747 03/30/18 0730 04/09/18 1041 04/10/18 0606  BILITOT 2.3* 4.0* 2.3* 2.2*  AST 110* 240* 101* 106*  ALT 45* 111* 56* 59*  ALKPHOS 85 100 132* 108  PROT 7.3 7.6 7.6 6.8  ALBUMIN 3.1* 3.1* 3.5 2.7*    TUMOR MARKERS: No results for input(s): AFPTM, CEA, CA199, CHROMGRNA in the last 8760 hours.  Assessment and Plan:  42 year old male with a history of alcoholic cirrhosis complicated by bleeding esophageal and  gastric varices.  He is now doing well 1 month status post combined TIPS with antegrade and retrograde transvenous obliteration of his varices.  He has not been taking his lactulose and is showing no signs of hepatic encephalopathy.  He has had no further episodes of bleeding and is relatively asymptomatic.  He remains abstinent from alcohol.  1.)  Discontinue lactulose. 2.)  Continue alcohol abstinence 3.)  Return clinic visit in 3 months with B RTO protocol CT scan of the abdomen and pelvis and liver labs (CBC, CMP and INR) 4.)  Follow-up with gastroenterology    Electronically Signed: Archer Asa, Deshane Cotroneo 05/20/2018, 12:01 PM   I spent a total of  15 Minutes in face to face in clinical consultation, greater than 50% of which was counseling/coordinating care for cirrhosis complicated by bleeding esophageal and gastric varices.

## 2018-07-19 ENCOUNTER — Other Ambulatory Visit: Payer: Self-pay

## 2018-07-19 ENCOUNTER — Inpatient Hospital Stay (HOSPITAL_COMMUNITY)
Admission: AD | Admit: 2018-07-19 | Discharge: 2018-07-22 | DRG: 369 | Disposition: A | Discharge status: Home / Self Care | Payer: Self-pay | Source: Other Acute Inpatient Hospital | Attending: Internal Medicine | Admitting: Internal Medicine

## 2018-07-19 ENCOUNTER — Encounter (HOSPITAL_COMMUNITY): Payer: Self-pay | Admitting: General Practice

## 2018-07-19 DIAGNOSIS — K2980 Duodenitis without bleeding: Secondary | ICD-10-CM | POA: Diagnosis present

## 2018-07-19 DIAGNOSIS — K7030 Alcoholic cirrhosis of liver without ascites: Secondary | ICD-10-CM | POA: Diagnosis present

## 2018-07-19 DIAGNOSIS — Z23 Encounter for immunization: Secondary | ICD-10-CM

## 2018-07-19 DIAGNOSIS — I8501 Esophageal varices with bleeding: Principal | ICD-10-CM | POA: Diagnosis present

## 2018-07-19 DIAGNOSIS — D72819 Decreased white blood cell count, unspecified: Secondary | ICD-10-CM | POA: Diagnosis present

## 2018-07-19 DIAGNOSIS — K920 Hematemesis: Secondary | ICD-10-CM | POA: Diagnosis present

## 2018-07-19 DIAGNOSIS — D62 Acute posthemorrhagic anemia: Secondary | ICD-10-CM | POA: Diagnosis present

## 2018-07-19 DIAGNOSIS — K275 Chronic or unspecified peptic ulcer, site unspecified, with perforation: Secondary | ICD-10-CM | POA: Diagnosis present

## 2018-07-19 DIAGNOSIS — D696 Thrombocytopenia, unspecified: Secondary | ICD-10-CM | POA: Diagnosis present

## 2018-07-19 DIAGNOSIS — I10 Essential (primary) hypertension: Secondary | ICD-10-CM | POA: Diagnosis present

## 2018-07-19 DIAGNOSIS — F1010 Alcohol abuse, uncomplicated: Secondary | ICD-10-CM | POA: Diagnosis present

## 2018-07-19 DIAGNOSIS — K922 Gastrointestinal hemorrhage, unspecified: Secondary | ICD-10-CM | POA: Insufficient documentation

## 2018-07-19 DIAGNOSIS — K766 Portal hypertension: Secondary | ICD-10-CM | POA: Diagnosis present

## 2018-07-19 DIAGNOSIS — Z79899 Other long term (current) drug therapy: Secondary | ICD-10-CM

## 2018-07-19 DIAGNOSIS — I8500 Esophageal varices without bleeding: Secondary | ICD-10-CM | POA: Diagnosis present

## 2018-07-19 DIAGNOSIS — K3189 Other diseases of stomach and duodenum: Secondary | ICD-10-CM | POA: Diagnosis present

## 2018-07-19 DIAGNOSIS — Z87891 Personal history of nicotine dependence: Secondary | ICD-10-CM

## 2018-07-19 HISTORY — DX: Headache: R51

## 2018-07-19 HISTORY — DX: Inflammatory liver disease, unspecified: K75.9

## 2018-07-19 HISTORY — DX: Unspecified cirrhosis of liver: K74.60

## 2018-07-19 HISTORY — DX: Headache, unspecified: R51.9

## 2018-07-19 LAB — CBC
HEMATOCRIT: 33.6 % — AB (ref 39.0–52.0)
HEMOGLOBIN: 11.1 g/dL — AB (ref 13.0–17.0)
MCH: 30.2 pg (ref 26.0–34.0)
MCHC: 33 g/dL (ref 30.0–36.0)
MCV: 91.3 fL (ref 80.0–100.0)
Platelets: 46 10*3/uL — ABNORMAL LOW (ref 150–400)
RBC: 3.68 MIL/uL — ABNORMAL LOW (ref 4.22–5.81)
RDW: 15.5 % (ref 11.5–15.5)
WBC: 2.2 10*3/uL — ABNORMAL LOW (ref 4.0–10.5)
nRBC: 0 % (ref 0.0–0.2)

## 2018-07-19 LAB — BASIC METABOLIC PANEL
ANION GAP: 9 (ref 5–15)
BUN: 8 mg/dL (ref 6–20)
CALCIUM: 8.4 mg/dL — AB (ref 8.9–10.3)
CHLORIDE: 105 mmol/L (ref 98–111)
CO2: 21 mmol/L — AB (ref 22–32)
CREATININE: 1.05 mg/dL (ref 0.61–1.24)
GFR calc non Af Amer: >60 mL/min (ref >60)
Glucose, Bld: 82 mg/dL (ref 70–99)
Potassium: 3.5 mmol/L (ref 3.5–5.1)
SODIUM: 135 mmol/L (ref 135–145)

## 2018-07-19 LAB — PROTIME-INR
INR: 1.46
Prothrombin Time: 17.6 seconds — ABNORMAL HIGH (ref 11.4–15.2)

## 2018-07-19 MED ORDER — SODIUM CHLORIDE 0.9% IV SOLUTION: Freq: Once | INTRAVENOUS | Status: DC

## 2018-07-19 MED ORDER — SODIUM CHLORIDE 0.9% FLUSH: 10.0000 mL | INTRAVENOUS | Status: DC | PRN

## 2018-07-19 MED ORDER — VITAMIN B-1 100 MG PO TABS
100.0000 mg | ORAL_TABLET | Freq: Every day | ORAL | Status: DC
  Administered 2018-07-21 – 2018-07-22 (×2): 100 mg via ORAL
  Filled 2018-07-19 (×3): qty 1

## 2018-07-19 MED ORDER — CHLORHEXIDINE GLUCONATE 0.12 % MT SOLN
15.0000 mL | Freq: Two times a day (BID) | OROMUCOSAL | Status: DC
  Administered 2018-07-20 – 2018-07-22 (×4): 15 mL via OROMUCOSAL
  Filled 2018-07-19 (×4): qty 15

## 2018-07-19 MED ORDER — THIAMINE HCL 100 MG/ML IJ SOLN
Freq: Once | INTRAVENOUS | Status: AC
  Administered 2018-07-19: 21:00:00 via INTRAVENOUS
  Filled 2018-07-19: qty 1000

## 2018-07-19 MED ORDER — FOLIC ACID 1 MG PO TABS: 1.0000 mg | ORAL_TABLET | Freq: Every day | ORAL | Status: DC

## 2018-07-19 MED ORDER — LORAZEPAM 2 MG/ML IJ SOLN: 0.0000 mg | Freq: Two times a day (BID) | INTRAMUSCULAR | Status: DC

## 2018-07-19 MED ORDER — PNEUMOCOCCAL VAC POLYVALENT 25 MCG/0.5ML IJ INJ
0.5000 mL | INJECTION | INTRAMUSCULAR | Status: DC
  Filled 2018-07-19: qty 0.5

## 2018-07-19 MED ORDER — LORAZEPAM 2 MG/ML IJ SOLN
0.0000 mg | Freq: Four times a day (QID) | INTRAMUSCULAR | Status: AC
  Filled 2018-07-19: qty 1

## 2018-07-19 MED ORDER — SODIUM CHLORIDE 0.9 % IV SOLN
8.0000 mg/h | INTRAVENOUS | Status: DC
  Administered 2018-07-19 – 2018-07-21 (×5): 8 mg/h via INTRAVENOUS
  Filled 2018-07-19 (×7): qty 80

## 2018-07-19 MED ORDER — LORAZEPAM 2 MG/ML IJ SOLN: 1.0000 mg | Freq: Four times a day (QID) | INTRAMUSCULAR | Status: DC | PRN

## 2018-07-19 MED ORDER — ORAL CARE MOUTH RINSE
15.0000 mL | Freq: Two times a day (BID) | OROMUCOSAL | Status: DC
  Administered 2018-07-20 – 2018-07-21 (×3): 15 mL via OROMUCOSAL

## 2018-07-19 MED ORDER — ONDANSETRON HCL 4 MG PO TABS: 4.0000 mg | ORAL_TABLET | Freq: Four times a day (QID) | ORAL | Status: DC | PRN

## 2018-07-19 MED ORDER — SODIUM CHLORIDE 0.9% FLUSH
3.0000 mL | Freq: Two times a day (BID) | INTRAVENOUS | Status: DC
  Administered 2018-07-20 – 2018-07-21 (×3): 3 mL via INTRAVENOUS

## 2018-07-19 MED ORDER — THIAMINE HCL 100 MG/ML IJ SOLN
100.0000 mg | Freq: Every day | INTRAMUSCULAR | Status: DC
  Administered 2018-07-19 – 2018-07-20 (×2): 100 mg via INTRAVENOUS
  Filled 2018-07-19: qty 2

## 2018-07-19 MED ORDER — LORAZEPAM 1 MG PO TABS: 1.0000 mg | ORAL_TABLET | Freq: Four times a day (QID) | ORAL | Status: DC | PRN

## 2018-07-19 MED ORDER — SODIUM CHLORIDE 0.9 % IV SOLN
2.0000 g | INTRAVENOUS | Status: DC
  Administered 2018-07-19 – 2018-07-21 (×3): 2 g via INTRAVENOUS
  Filled 2018-07-19 (×3): qty 20

## 2018-07-19 MED ORDER — INFLUENZA VAC SPLIT QUAD 0.5 ML IM SUSY
0.5000 mL | PREFILLED_SYRINGE | INTRAMUSCULAR | Status: AC
  Administered 2018-07-22: 0.5 mL via INTRAMUSCULAR
  Filled 2018-07-19 (×2): qty 0.5

## 2018-07-19 MED ORDER — ADULT MULTIVITAMIN W/MINERALS CH: 1.0000 | ORAL_TABLET | Freq: Every day | ORAL | Status: DC

## 2018-07-19 MED ORDER — ONDANSETRON HCL 4 MG/2ML IJ SOLN
4.0000 mg | Freq: Four times a day (QID) | INTRAMUSCULAR | Status: DC | PRN
  Administered 2018-07-21: 4 mg via INTRAVENOUS

## 2018-07-19 MED ORDER — SODIUM CHLORIDE 0.9 % IV SOLN
50.0000 ug/h | INTRAVENOUS | Status: DC
  Administered 2018-07-19 – 2018-07-21 (×4): 50 ug/h via INTRAVENOUS
  Filled 2018-07-19 (×6): qty 1

## 2018-07-19 NOTE — H&P (Signed)
History and Physical    Michael Ayers GBM:[id number] DOB: [birth year] DOA: 07/19/2018  PCP: Patient, No Pcp Per  Patient coming from: Home  Chief Complaint: Vomiting up blood  HPI: Michael Ayers is a 43 y.o. male with medical history significant of alcohol abuse, cirrhosis of the liver, esophageal varices, peptic ulcer disease, duodenitis transferred here from [facility] for coffee-ground hematemesis that started today.  Patient still active drinking last alcohol intake was 48 hours ago.  He denies any fevers.  Has been very nauseous and has vomited about twice today that was coffee-ground in appearance.  He denies any abdominal pain.  Denies any fevers.  No lower extremity swelling or edema.  He has not had any vomiting since his arrival to [facility].  Patient is being admitted and transferred here for upper GI bleed.  Review of Systems: As per HPI otherwise 10 point review of systems negative.   Past Medical History:  Diagnosis Date  . Alcohol abuse   . Duodenitis   . Esophageal varices (HCC)   . Essential hypertension   . Headache    "sometimes daily" (07/19/2018)  . Hepatitis    "don't remember which one it was" (07/19/2018)  . Liver cirrhosis (HCC)   . Perforated peptic ulcer (HCC)     Past Surgical History:  Procedure Laterality Date  . ESOPHAGOGASTRODUODENOSCOPY (EGD) WITH PROPOFOL N/A 01/16/2018   Procedure: ESOPHAGOGASTRODUODENOSCOPY (EGD) WITH PROPOFOL;  Surgeon: Vida Rigger, MD;  Location: [facility] ENDOSCOPY;  Service: Endoscopy;  Laterality: N/A;  . ESOPHAGOGASTRODUODENOSCOPY (EGD) WITH PROPOFOL N/A 03/27/2018   Procedure: ESOPHAGOGASTRODUODENOSCOPY (EGD) WITH PROPOFOL;  Surgeon: Carman Ching, MD;  Location: [facility] ENDOSCOPY;  Service: Endoscopy;  Laterality: N/A;  . INCISION AND DRAINAGE ABSCESS ANAL  04/17/2018  . IR ANGIOGRAM FOLLOW UP STUDY  04/09/2018  . IR ANGIOGRAM SELECTIVE EACH ADDITIONAL VESSEL  03/30/2018  . IR ANGIOGRAM SELECTIVE EACH ADDITIONAL  VESSEL  03/30/2018  . IR ANGIOGRAM SELECTIVE EACH ADDITIONAL VESSEL  04/09/2018  . IR ANGIOGRAM SELECTIVE EACH ADDITIONAL VESSEL  04/09/2018  . IR ANGIOGRAM SELECTIVE EACH ADDITIONAL VESSEL  04/09/2018  . IR ANGIOGRAM SELECTIVE EACH ADDITIONAL VESSEL  04/09/2018  . IR EMBO ART  VEN HEMORR LYMPH EXTRAV  INC GUIDE ROADMAPPING  04/09/2018  . IR RADIOLOGIST EVAL & MGMT  05/20/2018  . IR TIPS  04/09/2018  . IR US GUIDE VASC ACCESS RIGHT  03/30/2018  . IR US GUIDE VASC ACCESS RIGHT  04/09/2018  . IR US GUIDE VASC ACCESS RIGHT  04/09/2018  . IR US GUIDE VASC ACCESS RIGHT  04/09/2018  . IR VENOGRAM ADRENAL UNI LEFT  03/30/2018  . IR VENOGRAM RENAL UNI LEFT  03/30/2018  . IR VENOGRAM RENAL UNI LEFT  04/09/2018  . RADIOLOGY WITH ANESTHESIA N/A 04/09/2018   Procedure: IR TIPS/BRTO;  Surgeon: Malachy Moan, MD;  Location: [facility] OR;  Service: Radiology;  Laterality: N/A;  . REPAIR OF PERFORATED ULCER  ~ 2015   peptic ulcer     reports that he quit smoking about 16 years ago. His smoking use included cigarettes. He has a 3.30 pack-year smoking history. He has never used smokeless tobacco. He reports current alcohol use of about 35.0 standard drinks of alcohol per week. He reports that he does not use drugs.  No Known Allergies  Family History  Problem Relation Age of Onset  . Diabetes Mellitus II Mother     Prior to Admission medications   Medication Sig Start Date End Date Taking? Authorizing Provider  acetaminophen (TYLENOL)  500 MG tablet Take 500 mg by mouth every 6 (six) hours as needed for mild pain.    [provider]  folic acid (FOLVITE) 1 MG tablet Take 1 tablet (1 mg total) by mouth daily. 03/31/18   Laverna Peace, MD  ibuprofen (ADVIL,MOTRIN) 600 MG tablet Take 1 tablet (600 mg total) by mouth every 6 (six) hours as needed. 04/18/18   Antony Madura, PA-C  lactulose (CHRONULAC) 10 GM/15ML solution Take 30 mLs (20 g total) by mouth 3 (three) times daily. Patient not taking: Reported on  05/20/2018 04/10/18   Brayton El, PA-C  Multiple Vitamin (MULTIVITAMIN WITH MINERALS) TABS tablet Take 1 tablet by mouth daily. 03/31/18   Roberto Scales D, MD  nadolol (CORGARD) 20 MG tablet Take 1 tablet (20 mg total) by mouth daily. Patient not taking: Reported on 05/20/2018 03/30/18   Laverna Peace, MD    Physical Exam: Vitals:   07/19/18 1950 07/19/18 1953  BP: 135/82   Pulse: 62   Resp: 12   Temp:  98.2 F (36.8 C)  TempSrc:  Oral  SpO2: 95%     Constitutional: NAD, calm, comfortable Vitals:   07/19/18 1950 07/19/18 1953  BP: 135/82   Pulse: 62   Resp: 12   Temp:  98.2 F (36.8 C)  TempSrc:  Oral  SpO2: 95%    Eyes: PERRL, lids and conjunctivae normal ENMT: Mucous membranes are moist. Posterior pharynx clear of any exudate or lesions.Normal dentition.  Neck: normal, supple, no masses, no thyromegaly Respiratory: clear to auscultation bilaterally, no wheezing, no crackles. Normal respiratory effort. No accessory muscle use.  Cardiovascular: Regular rate and rhythm, no murmurs / rubs / gallops. No extremity edema. 2+ pedal pulses. No carotid bruits.  Abdomen: no tenderness, no masses palpated. No hepatosplenomegaly. Bowel sounds positive.  Musculoskeletal: no clubbing / cyanosis. No joint deformity upper and lower extremities. Good ROM, no contractures. Normal muscle tone.  Skin: no rashes, lesions, ulcers. No induration Neurologic: CN 2-12 grossly intact. Sensation intact, DTR normal. Strength 5/5 in all 4.  Psychiatric: Normal judgment and insight. Alert and oriented x 3. Normal mood.    Labs on Admission: I have personally reviewed following labs and imaging studies  CBC: No results for input(s): WBC, NEUTROABS, HGB, HCT, MCV, PLT in the last 168 hours. Basic Metabolic Panel: No results for input(s): NA, K, CL, CO2, GLUCOSE, BUN, CREATININE, CALCIUM, MG, PHOS in the last 168 hours. GFR: CrCl cannot be calculated (Patient's most recent lab result is older  than the maximum 21 days allowed.). Liver Function Tests: No results for input(s): AST, ALT, ALKPHOS, BILITOT, PROT, ALBUMIN in the last 168 hours. No results for input(s): LIPASE, AMYLASE in the last 168 hours. No results for input(s): AMMONIA in the last 168 hours. Coagulation Profile: No results for input(s): INR, PROTIME in the last 168 hours. Cardiac Enzymes: No results for input(s): CKTOTAL, CKMB, CKMBINDEX, TROPONINI in the last 168 hours. BNP (last 3 results) No results for input(s): PROBNP in the last 8760 hours. HbA1C: No results for input(s): HGBA1C in the last 72 hours. CBG: No results for input(s): GLUCAP in the last 168 hours. Lipid Profile: No results for input(s): CHOL, HDL, LDLCALC, TRIG, CHOLHDL, LDLDIRECT in the last 72 hours. Thyroid Function Tests: No results for input(s): TSH, T4TOTAL, FREET4, T3FREE, THYROIDAB in the last 72 hours. Anemia Panel: No results for input(s): VITAMINB12, FOLATE, FERRITIN, TIBC, IRON, RETICCTPCT in the last 72 hours. Urine analysis:    Component Value  Date/Time   COLORURINE YELLOW 04/17/2018 2314   APPEARANCEUR CLEAR 04/17/2018 2314   LABSPEC 1.003 (L) 04/17/2018 2314   PHURINE 7.0 04/17/2018 2314   GLUCOSEU NEGATIVE 04/17/2018 2314   HGBUR NEGATIVE 04/17/2018 2314   BILIRUBINUR NEGATIVE 04/17/2018 2314   KETONESUR NEGATIVE 04/17/2018 2314   PROTEINUR NEGATIVE 04/17/2018 2314   UROBILINOGEN 1.0 03/27/2011 1714   NITRITE NEGATIVE 04/17/2018 2314   LEUKOCYTESUR NEGATIVE 04/17/2018 2314   Sepsis Labs: !!!!!!!!!!!!!!!!!!!!!!!!!!!!!!!!!!!!!!!!!!!! @LABRCNTIP (procalcitonin:4,lacticidven:4) )No results found for this or any previous visit (from the past 240 hour(s)).   Radiological Exams on Admission: No results found.  Old chart reviewed  Assessment/Plan 43 year old male with a history of alcohol abuse and esophageal varices comes in with coffee-ground hematemesis Principal Problem:   Hematemesis-patient hemodynamically  stable.  Repeat CBC stat right now.  Placed on Protonix drip along with octreotide.  Placed on Rocephin.  GI consulted Dr. R[facility] for nonemergent consultation.  Type and screen.  IV fluids.  PRN Zofran ordered.  Monitor closely for any further bleeding.  N.p.o. except ice chips for likely scoping in the morning.  Active Problems:   Alcohol abuse-placed on CIWA protocol no evidence of alcohol withdrawal at this time    Esophageal varices (HCC)-in the past.  Placed on octreotide drip.    Essential hypertension-hold blood pressure meds at this time    Perforated peptic ulcer (HCC)-placed on Protonix drip   Med rec pending pharmacy review  DVT prophylaxis: SCDs Code Status: Full Family Communication: None Disposition Plan: 1 to 3 days Consults called: [facility] Dr. Randa EvensEdwards called and notified for routine consult Admission status: Admission   Breanah Faddis A MD [facility]  If 7PM-7AM, please contact night-coverage [email or web address] Password [email or web address]  07/19/2018, 8:10 PM

## 2018-07-19 NOTE — Progress Notes (Signed)
Pt arrived to the unit via PTAR, alert and oriented X4. Pt placed on PC monitor and telemetry notified. Bed in lowest position, call light within reach and bed alarm on. Pt understands to call staff when assistance is needed.

## 2018-07-20 DIAGNOSIS — K275 Chronic or unspecified peptic ulcer, site unspecified, with perforation: Secondary | ICD-10-CM

## 2018-07-20 DIAGNOSIS — I10 Essential (primary) hypertension: Secondary | ICD-10-CM

## 2018-07-20 DIAGNOSIS — I8501 Esophageal varices with bleeding: Principal | ICD-10-CM

## 2018-07-20 DIAGNOSIS — F1010 Alcohol abuse, uncomplicated: Secondary | ICD-10-CM

## 2018-07-20 LAB — CBC
HCT: 33 % — ABNORMAL LOW (ref 39.0–52.0)
Hemoglobin: 11.2 g/dL — ABNORMAL LOW (ref 13.0–17.0)
MCH: 31.2 pg (ref 26.0–34.0)
MCHC: 33.9 g/dL (ref 30.0–36.0)
MCV: 91.9 fL (ref 80.0–100.0)
Platelets: 47 10*3/uL — ABNORMAL LOW (ref 150–400)
RBC: 3.59 MIL/uL — AB (ref 4.22–5.81)
RDW: 15.5 % (ref 11.5–15.5)
WBC: 2.1 10*3/uL — ABNORMAL LOW (ref 4.0–10.5)
nRBC: 0 % (ref 0.0–0.2)

## 2018-07-20 LAB — COMPREHENSIVE METABOLIC PANEL
ALT: 24 U/L (ref 0–44)
AST: 69 U/L — ABNORMAL HIGH (ref 15–41)
Albumin: 2.8 g/dL — ABNORMAL LOW (ref 3.5–5.0)
Alkaline Phosphatase: 103 U/L (ref 38–126)
Anion gap: 9 (ref 5–15)
BUN: 8 mg/dL (ref 6–20)
CO2: 23 mmol/L (ref 22–32)
Calcium: 8.5 mg/dL — ABNORMAL LOW (ref 8.9–10.3)
Chloride: 106 mmol/L (ref 98–111)
Creatinine, Ser: 0.98 mg/dL (ref 0.61–1.24)
GFR calc Af Amer: >60 mL/min (ref >60)
GFR calc non Af Amer: >60 mL/min (ref >60)
Glucose, Bld: 77 mg/dL (ref 70–99)
Potassium: 3.7 mmol/L (ref 3.5–5.1)
Sodium: 138 mmol/L (ref 135–145)
Total Bilirubin: 3.7 mg/dL — ABNORMAL HIGH (ref 0.3–1.2)
Total Protein: 6.8 g/dL (ref 6.5–8.1)

## 2018-07-20 LAB — TYPE AND SCREEN
ABO/RH(D): AB POS
Antibody Screen: NEGATIVE

## 2018-07-20 LAB — HEMOGLOBIN AND HEMATOCRIT, BLOOD
HCT: 33.9 % — ABNORMAL LOW (ref 39.0–52.0)
HCT: 33.6 % — ABNORMAL LOW (ref 39.0–52.0)
HCT: 33.2 % — ABNORMAL LOW (ref 39.0–52.0)
Hemoglobin: 11 g/dL — ABNORMAL LOW (ref 13.0–17.0)
Hemoglobin: 11.1 g/dL — ABNORMAL LOW (ref 13.0–17.0)
Hemoglobin: 11.2 g/dL — ABNORMAL LOW (ref 13.0–17.0)

## 2018-07-20 MED ORDER — OXYCODONE HCL 5 MG PO TABS: 5.0000 mg | ORAL_TABLET | Freq: Four times a day (QID) | ORAL | Status: DC | PRN

## 2018-07-20 MED ORDER — CHLORDIAZEPOXIDE HCL 5 MG PO CAPS
15.0000 mg | ORAL_CAPSULE | Freq: Three times a day (TID) | ORAL | Status: DC
  Administered 2018-07-20 – 2018-07-21 (×3): 15 mg via ORAL
  Filled 2018-07-20 (×3): qty 3

## 2018-07-20 MED ORDER — MORPHINE SULFATE (PF) 2 MG/ML IV SOLN
2.0000 mg | Freq: Once | INTRAVENOUS | Status: AC
  Administered 2018-07-20: 2 mg via INTRAVENOUS
  Filled 2018-07-20: qty 1

## 2018-07-20 MED ORDER — HYDRALAZINE HCL 20 MG/ML IJ SOLN: 10.0000 mg | Freq: Four times a day (QID) | INTRAMUSCULAR | Status: DC | PRN

## 2018-07-20 MED ORDER — CLONIDINE HCL 0.1 MG/24HR TD PTWK
0.1000 mg | MEDICATED_PATCH | TRANSDERMAL | Status: DC
  Administered 2018-07-20: 0.1 mg via TRANSDERMAL
  Filled 2018-07-20 (×2): qty 1

## 2018-07-20 NOTE — Progress Notes (Addendum)
PROGRESS NOTE                                                                                                                                                                                                             Patient Demographics:    Michael Ayers, is a 43 y.o. male, DOB - [birth year], RCB:[id number]  Admit date - 07/19/2018   Admitting Physician Dorcas Carrow, MD  Outpatient Primary MD for the patient is Patient, No Pcp Per  LOS - 1  CC -   Vomiting up blood.  Brief Narrative  - Michael Ayers is a 43 y.o. male with medical history significant of alcohol abuse, cirrhosis of the liver, esophageal varices, peptic ulcer disease, duodenitis transferred here from [facility] for coffee-ground hematemesis that started today.  Patient still active drinking last alcohol intake was 48 hours ago.  He denies any fevers.  Has been very nauseous and has vomited about twice today that was coffee-ground in appearance.  He denies any abdominal pain.  Denies any fevers.  No lower extremity swelling or edema.  He has not had any vomiting since his arrival to [facility].  Patient is being admitted and transferred here for upper GI bleed.   Subjective:    Michael Ayers today has, No headache, No chest pain, No abdominal pain - No Nausea, No new weakness tingling or numbness, No Cough - SOB.     Assessment  & Plan :     Hematemesis H/O Varices & PUD - currently on IV PPI - Octreotide, continue IV Rocephin for SBP prophylaxis, monitor H&H type screen, transfuse as needed, GI to see soon.   Alcohol abuse -  Counseled to quit, once he is taking oral diet we placed him on scheduled Librium + CIWA, monitor.  Esophageal varices (HCC)- in the past.  Placed on octreotide drip.  Essential hypertension- placed on Catapres patch along with PRN Hydralazine  History of alcoholic cirrhosis with portal hypertension.  Resultant thrombocytopenia and mild leukopenia,  supportive care as above.     Family Communication  :  SCDs  Code Status :  Full  Disposition Plan  :  TBD  Consults  : [facility]    Procedures  :   DVT Prophylaxis  :  Lovenox    Lab Results  Component Value Date   PLT 47 (L) 07/20/2018    Diet :  Diet Order            Diet NPO time  specified  Diet effective now               Inpatient Medications Scheduled Meds: . sodium chloride   Intravenous Once  . chlorhexidine  15 mL Mouth Rinse BID  . Influenza vac split quadrivalent PF  0.5 mL Intramuscular Tomorrow-1000  . LORazepam  0-4 mg Intravenous Q6H   Followed by  . [START ON 07/21/2018] LORazepam  0-4 mg Intravenous Q12H  . mouth rinse  15 mL Mouth Rinse q12n4p  . pneumococcal 23 valent vaccine  0.5 mL Intramuscular Tomorrow-1000  . sodium chloride flush  3 mL Intravenous Q12H  . thiamine  100 mg Oral Daily   Or  . thiamine  100 mg Intravenous Daily   Continuous Infusions: . cefTRIAXone (ROCEPHIN)  IV 2 g (07/19/18 2212)  . octreotide  (SANDOSTATIN)    IV infusion 50 mcg/hr (07/19/18 2212)  . pantoprozole (PROTONIX) infusion 8 mg/hr (07/19/18 2208)   PRN Meds:.LORazepam **OR** LORazepam, ondansetron **OR** ondansetron (ZOFRAN) IV, sodium chloride flush  Antibiotics  :   Anti-infectives (From admission, onward)   Start     Dose/Rate Route Frequency Ordered Stop   07/19/18 2100  cefTRIAXone (ROCEPHIN) 2 g in sodium chloride 0.9 % 100 mL IVPB     2 g 200 mL/hr over 30 Minutes Intravenous Every 24 hours 07/19/18 1942            Objective:   Vitals:   07/19/18 2350 07/20/18 0510 07/20/18 0527 07/20/18 0754  BP: (!) 144/76 (!) 147/85  (!) 150/90  Pulse: 70 92    Resp: 14 14    Temp:   98.3 F (36.8 C) 98.5 F (36.9 C)  TempSrc:   Oral Oral  SpO2: 96% 94%    Weight:      Height:        Wt Readings from Last 3 Encounters:  07/19/18 77.2 kg  05/20/18 78.8 kg  04/09/18 72.9 kg     Intake/Output Summary (Last 24 hours) at 07/20/2018  0856 Last data filed at 07/20/2018 0601 Gross per 24 hour  Intake 1250.14 ml  Output 1450 ml  Net -199.86 ml     Physical Exam  Awake Alert, Oriented X 3, No new F.N deficits, Normal affect [place].AT,PERRAL Supple Neck,No JVD, No cervical lymphadenopathy appriciated.  Symmetrical Chest wall movement, Good air movement bilaterally, CTAB RRR,No Gallops,Rubs or new Murmurs, No Parasternal Heave +ve B.Sounds, Abd Soft, No tenderness, No organomegaly appriciated, No rebound - guarding or rigidity. No Cyanosis, Clubbing or edema, No new Rash or bruise      Data Review:    CBC Recent Labs  Lab 07/19/18 2117 07/20/18 0226  WBC 2.2* 2.1*  HGB 11.1* 11.2*  HCT 33.6* 33.0*  PLT 46* 47*  MCV 91.3 91.9  MCH 30.2 31.2  MCHC 33.0 33.9  RDW 15.5 15.5    Chemistries  Recent Labs  Lab 07/19/18 2117 07/20/18 0226  NA 135 138  K 3.5 3.7  CL 105 106  CO2 21* 23  GLUCOSE 82 77  BUN 8 8  CREATININE 1.05 0.98  CALCIUM 8.4* 8.5*  AST  --  69*  ALT  --  24  ALKPHOS  --  103  BILITOT  --  3.7*   ------------------------------------------------------------------------------------------------------------------ No results for input(s): CHOL, HDL, LDLCALC, TRIG, CHOLHDL, LDLDIRECT in the last 72 hours.  No results found for: HGBA1C ------------------------------------------------------------------------------------------------------------------ No results for input(s): TSH, T4TOTAL, T3FREE, THYROIDAB in the last 72 hours.  Invalid input(s): FREET3 ------------------------------------------------------------------------------------------------------------------  No results for input(s): VITAMINB12, FOLATE, FERRITIN, TIBC, IRON, RETICCTPCT in the last 72 hours.  Coagulation profile Recent Labs  Lab 07/19/18 2117  INR 1.46    No results for input(s): DDIMER in the last 72 hours.  Cardiac Enzymes No results for input(s): CKMB, TROPONINI, MYOGLOBIN in the last 168 hours.  Invalid  input(s): CK ------------------------------------------------------------------------------------------------------------------ No results found for: BNP  Micro Results No results found for this or any previous visit (from the past 240 hour(s)).  Radiology Reports No results found.  Time Spent in minutes  30   Susa RaringPrashant Keyshon Stein M.D on 07/20/2018 at 8:56 AM  To page go to [email or web address] - password [email or web address]

## 2018-07-20 NOTE — Consult Note (Signed)
Reason for Consult: Hematemesis Referring Physician: [facility]  [name]  Michael Ayers HPI: This is a 43 year old alcoholic who represents with hematemesis, but this time it is more coffee-ground emesis.  He was admitted on 01/2018 and 03/2018 with findings of small distal esophageal varices and a fundic varix.  As a result of his recurrent bleeding, with continued ETOH abuse, underwent a TIPS/BTRO on 04/09/2018.  This was a successful procedure.  He typically follows a GI in [place] and he was [facility] for further evaluation and treatment.  His HGB during these prior episodes of hematemesis remained stable, as is the current presentation.  During the prior two admission, he had hematemesis.  This time he has more coffee-ground emesis.  Past Medical History:  Diagnosis Date  . Alcohol abuse   . Duodenitis   . Esophageal varices (HCC)   . Essential hypertension   . Headache    "sometimes daily" (07/19/2018)  . Hepatitis    "don't remember which one it was" (07/19/2018)  . Liver cirrhosis (HCC)   . Perforated peptic ulcer (HCC)     Past Surgical History:  Procedure Laterality Date  . ESOPHAGOGASTRODUODENOSCOPY (EGD) WITH PROPOFOL N/A 01/16/2018   Procedure: ESOPHAGOGASTRODUODENOSCOPY (EGD) WITH PROPOFOL;  Surgeon: Vida RiggerMagod, Marc, MD;  [facility];  Service: Endoscopy;  Laterality: N/A;  . ESOPHAGOGASTRODUODENOSCOPY (EGD) WITH PROPOFOL N/A 03/27/2018   Procedure: ESOPHAGOGASTRODUODENOSCOPY (EGD) WITH PROPOFOL;  Surgeon: Carman ChingEdwards, James, MD;  [facility];  Service: Endoscopy;  Laterality: N/A;  . INCISION AND DRAINAGE ABSCESS ANAL  04/17/2018  . IR ANGIOGRAM FOLLOW UP STUDY  04/09/2018  . IR ANGIOGRAM SELECTIVE EACH ADDITIONAL VESSEL  03/30/2018  . IR ANGIOGRAM SELECTIVE EACH ADDITIONAL VESSEL  03/30/2018  . IR ANGIOGRAM SELECTIVE EACH ADDITIONAL VESSEL  04/09/2018  . IR ANGIOGRAM SELECTIVE EACH ADDITIONAL VESSEL  04/09/2018  . IR  ANGIOGRAM SELECTIVE EACH ADDITIONAL VESSEL  04/09/2018  . IR ANGIOGRAM SELECTIVE EACH ADDITIONAL VESSEL  04/09/2018  . IR EMBO ART  VEN HEMORR LYMPH EXTRAV  INC GUIDE ROADMAPPING  04/09/2018  . IR RADIOLOGIST EVAL & MGMT  05/20/2018  . IR TIPS  04/09/2018  . IR US GUIDE VASC ACCESS RIGHT  03/30/2018  . IR US GUIDE VASC ACCESS RIGHT  04/09/2018  . IR US GUIDE VASC ACCESS RIGHT  04/09/2018  . IR US GUIDE VASC ACCESS RIGHT  04/09/2018  . IR VENOGRAM ADRENAL UNI LEFT  03/30/2018  . IR VENOGRAM RENAL UNI LEFT  03/30/2018  . IR VENOGRAM RENAL UNI LEFT  04/09/2018  . RADIOLOGY WITH ANESTHESIA N/A 04/09/2018   Procedure: IR TIPS/BRTO;  Surgeon: Malachy MoanMcCullough, Heath, MD;  [facility];  Service: Radiology;  Laterality: N/A;  . REPAIR OF PERFORATED ULCER  ~ 2015   peptic ulcer    Family History  Problem Relation Age of Onset  . Diabetes Mellitus II Mother     Social History:  reports that he quit smoking about 16 years ago. His smoking use included cigarettes. He has a 3.30 pack-year smoking history. He has never used smokeless tobacco. He reports current alcohol use of about 35.0 standard drinks of alcohol per week. He reports that he does not use drugs.  Allergies: No Known Allergies  Medications:  Scheduled: . sodium chloride   Intravenous Once  . chlorhexidine  15 mL Mouth Rinse BID  . cloNIDine  0.1 mg Transdermal Weekly  . Influenza vac split quadrivalent PF  0.5 mL Intramuscular Tomorrow-1000  . LORazepam  0-4 mg Intravenous Q6H   Followed by  . [START ON 07/21/2018] LORazepam  0-4 mg Intravenous Q12H  . mouth rinse  15 mL Mouth Rinse q12n4p  . pneumococcal 23 valent vaccine  0.5 mL Intramuscular Tomorrow-1000  . sodium chloride flush  3 mL Intravenous Q12H  . thiamine  100 mg Oral Daily   Or  . thiamine  100 mg Intravenous Daily   Continuous: . cefTRIAXone (ROCEPHIN)  IV 2 g (07/19/18 2212)  . octreotide  (SANDOSTATIN)    IV infusion 50 mcg/hr (07/19/18 2212)  . pantoprozole (PROTONIX)  infusion 8 mg/hr (07/20/18 0958)    Results for orders placed or performed during the hospital encounter of 07/19/18 (from the past 24 hour(s))  CBC     Status: Abnormal   Collection Time: 07/19/18  9:17 PM  Result Value Ref Range   WBC 2.2 (L) 4.0 - 10.5 K/uL   RBC 3.68 (L) 4.22 - 5.81 MIL/uL   Hemoglobin 11.1 (L) 13.0 - 17.0 g/dL   HCT 16.133.6 (L) 09.639.0 - 04.552.0 %   MCV 91.3 80.0 - 100.0 fL   MCH 30.2 26.0 - 34.0 pg   MCHC 33.0 30.0 - 36.0 g/dL   RDW 40.915.5 81.111.5 - 91.415.5 %   Platelets 46 (L) 150 - 400 K/uL   nRBC 0.0 0.0 - 0.2 %  Basic metabolic panel     Status: Abnormal   Collection Time: 07/19/18  9:17 PM  Result Value Ref Range   Sodium 135 135 - 145 mmol/L   Potassium 3.5 3.5 - 5.1 mmol/L   Chloride 105 98 - 111 mmol/L   CO2 21 (L) 22 - 32 mmol/L   Glucose, Bld 82 70 - 99 mg/dL   BUN 8 6 - 20 mg/dL   Creatinine, Ser 7.821.05 0.61 - 1.24 mg/dL   Calcium 8.4 (L) 8.9 - 10.3 mg/dL   GFR calc non Af Amer >60 >60 mL/min   GFR calc Af Amer >60 >60 mL/min   Anion gap 9 5 - 15  Protime-INR     Status: Abnormal   Collection Time: 07/19/18  9:17 PM  Result Value Ref Range   Prothrombin Time 17.6 (H) 11.4 - 15.2 seconds   INR 1.46   Type and screen [facility]     Status: None   Collection Time: 07/19/18  9:17 PM  Result Value Ref Range   ABO/RH(D) AB POS    Antibody Screen NEG    Sample Expiration      07/22/2018 Performed at [facility], [address] [place], [place] [address]   Comprehensive metabolic panel     Status: Abnormal   Collection Time: 07/20/18  2:26 AM  Result Value Ref Range   Sodium 138 135 - 145 mmol/L   Potassium 3.7 3.5 - 5.1 mmol/L   Chloride 106 98 - 111 mmol/L   CO2 23 22 - 32 mmol/L   Glucose, Bld 77 70 - 99 mg/dL   BUN 8 6 - 20 mg/dL   Creatinine, Ser 3.080.98 0.61 - 1.24 mg/dL   Calcium 8.5 (L) 8.9 - 10.3 mg/dL   Total Protein 6.8 6.5 - 8.1 g/dL   Albumin 2.8 (L) 3.5 - 5.0 g/dL   AST 69 (H) 15 - 41 U/L   ALT 24 0 - 44 U/L    Alkaline Phosphatase 103 38 - 126 U/L   Total Bilirubin 3.7 (H) 0.3 - 1.2 mg/dL   GFR calc non Af Amer >  60 >60 mL/min   GFR calc Af Amer >60 >60 mL/min   Anion gap 9 5 - 15  CBC     Status: Abnormal   Collection Time: 07/20/18  2:26 AM  Result Value Ref Range   WBC 2.1 (L) 4.0 - 10.5 K/uL   RBC 3.59 (L) 4.22 - 5.81 MIL/uL   Hemoglobin 11.2 (L) 13.0 - 17.0 g/dL   HCT 08.6 (L) 76.1 - 95.0 %   MCV 91.9 80.0 - 100.0 fL   MCH 31.2 26.0 - 34.0 pg   MCHC 33.9 30.0 - 36.0 g/dL   RDW 93.2 67.1 - 24.5 %   Platelets 47 (L) 150 - 400 K/uL   nRBC 0.0 0.0 - 0.2 %  Hemoglobin and hematocrit, blood     Status: Abnormal   Collection Time: 07/20/18 10:00 AM  Result Value Ref Range   Hemoglobin 11.0 (L) 13.0 - 17.0 g/dL   HCT 80.9 (L) 98.3 - 38.2 %     No results found.  ROS:  As stated above in the HPI otherwise negative.  Blood pressure (!) 150/84, pulse 77, temperature 98.5 F (36.9 C), temperature source Oral, resp. rate 14, height 5\' 9"  (1.753 m), weight 77.2 kg, SpO2 94 %.    PE: Gen: NAD, Alert and Oriented HEENT:  [place]/AT, EOMI Neck: Supple, no LAD Lungs: CTA Bilaterally CV: RRR without M/G/R ABM: Soft, NTND, +BS Ext: No C/C/E  Assessment/Plan: 1) Coffee-ground emesis. 2) History of fundic varix s/p TIPS/BTRO.  3) ETOH abuse.   The patient is hemodynamically stable and his HGB has not changed significantly.  Further evaluation with a repeat EGD will need to be performed to examine the size of the esophageal varices and for any residual fundic varix s/p TIPS/BTRO.  If there is still evidence of a fundic varix, then IR will need to interrogate the TIPS.  The coffee-ground emesis is a positive improvement compared to the prior two admission last year.  Plan: 1) EGD tomorrow. Almena Hokenson D 07/20/2018, 11:09 AM

## 2018-07-21 ENCOUNTER — Encounter (HOSPITAL_COMMUNITY): Payer: Self-pay | Admitting: *Deleted

## 2018-07-21 ENCOUNTER — Inpatient Hospital Stay (HOSPITAL_COMMUNITY): Payer: Self-pay | Admitting: Anesthesiology

## 2018-07-21 ENCOUNTER — Encounter (HOSPITAL_COMMUNITY)
Admission: AD | Disposition: A | Discharge status: Home / Self Care | Payer: Self-pay | Source: Other Acute Inpatient Hospital | Attending: Internal Medicine

## 2018-07-21 DIAGNOSIS — K922 Gastrointestinal hemorrhage, unspecified: Secondary | ICD-10-CM

## 2018-07-21 HISTORY — PX: ESOPHAGOGASTRODUODENOSCOPY (EGD) WITH PROPOFOL: SHX5813

## 2018-07-21 LAB — COMPREHENSIVE METABOLIC PANEL
ALT: 21 U/L (ref 0–44)
AST: 56 U/L — ABNORMAL HIGH (ref 15–41)
Albumin: 2.7 g/dL — ABNORMAL LOW (ref 3.5–5.0)
Alkaline Phosphatase: 101 U/L (ref 38–126)
Anion gap: 9 (ref 5–15)
BUN: 9 mg/dL (ref 6–20)
CHLORIDE: 106 mmol/L (ref 98–111)
CO2: 23 mmol/L (ref 22–32)
Calcium: 8.3 mg/dL — ABNORMAL LOW (ref 8.9–10.3)
Creatinine, Ser: 0.98 mg/dL (ref 0.61–1.24)
GFR calc Af Amer: >60 mL/min (ref >60)
GFR calc non Af Amer: >60 mL/min (ref >60)
Glucose, Bld: 68 mg/dL — ABNORMAL LOW (ref 70–99)
Potassium: 3.5 mmol/L (ref 3.5–5.1)
Sodium: 138 mmol/L (ref 135–145)
Total Bilirubin: 3.4 mg/dL — ABNORMAL HIGH (ref 0.3–1.2)
Total Protein: 6.6 g/dL (ref 6.5–8.1)

## 2018-07-21 LAB — CBC
HCT: 32.8 % — ABNORMAL LOW (ref 39.0–52.0)
Hemoglobin: 11.1 g/dL — ABNORMAL LOW (ref 13.0–17.0)
MCH: 31.2 pg (ref 26.0–34.0)
MCHC: 33.8 g/dL (ref 30.0–36.0)
MCV: 92.1 fL (ref 80.0–100.0)
PLATELETS: 50 10*3/uL — AB (ref 150–400)
RBC: 3.56 MIL/uL — ABNORMAL LOW (ref 4.22–5.81)
RDW: 15.4 % (ref 11.5–15.5)
WBC: 1.6 10*3/uL — ABNORMAL LOW (ref 4.0–10.5)
nRBC: 0 % (ref 0.0–0.2)

## 2018-07-21 SURGERY — ESOPHAGOGASTRODUODENOSCOPY (EGD) WITH PROPOFOL
Anesthesia: Monitor Anesthesia Care

## 2018-07-21 MED ORDER — CHLORDIAZEPOXIDE HCL 5 MG PO CAPS
15.0000 mg | ORAL_CAPSULE | Freq: Two times a day (BID) | ORAL | Status: DC
  Administered 2018-07-21: 15 mg via ORAL
  Filled 2018-07-21: qty 3

## 2018-07-21 MED ORDER — MIDAZOLAM HCL 5 MG/5ML IJ SOLN
INTRAMUSCULAR | Status: DC | PRN
  Administered 2018-07-21: 2 mg via INTRAVENOUS

## 2018-07-21 MED ORDER — LACTATED RINGERS IV SOLN
INTRAVENOUS | Status: DC
  Administered 2018-07-21: 15:00:00 via INTRAVENOUS

## 2018-07-21 MED ORDER — PROPOFOL 500 MG/50ML IV EMUL
INTRAVENOUS | Status: DC | PRN
  Administered 2018-07-21: 100 ug/kg/min via INTRAVENOUS

## 2018-07-21 MED ORDER — SODIUM CHLORIDE 0.9 % IV SOLN: INTRAVENOUS | Status: DC

## 2018-07-21 MED ORDER — FENTANYL CITRATE (PF) 100 MCG/2ML IJ SOLN
INTRAMUSCULAR | Status: AC
  Filled 2018-07-21: qty 2

## 2018-07-21 MED ORDER — MIDAZOLAM HCL 2 MG/2ML IJ SOLN
INTRAMUSCULAR | Status: AC
  Filled 2018-07-21: qty 2

## 2018-07-21 MED ORDER — PANTOPRAZOLE SODIUM 40 MG PO TBEC
40.0000 mg | DELAYED_RELEASE_TABLET | Freq: Every day | ORAL | Status: DC
  Administered 2018-07-21 – 2018-07-22 (×2): 40 mg via ORAL
  Filled 2018-07-21 (×2): qty 1

## 2018-07-21 MED ORDER — PROPOFOL 10 MG/ML IV BOLUS
INTRAVENOUS | Status: DC | PRN
  Administered 2018-07-21: 20 mg via INTRAVENOUS
  Administered 2018-07-21 (×2): 15 mg via INTRAVENOUS
  Administered 2018-07-21: 20 mg via INTRAVENOUS

## 2018-07-21 MED ORDER — CHLORDIAZEPOXIDE HCL 5 MG PO CAPS: 15.0000 mg | ORAL_CAPSULE | Freq: Two times a day (BID) | ORAL | Status: DC

## 2018-07-21 MED ORDER — LACTATED RINGERS IV SOLN
INTRAVENOUS | Status: DC | PRN
  Administered 2018-07-21: 16:00:00 via INTRAVENOUS

## 2018-07-21 MED ORDER — FENTANYL CITRATE (PF) 100 MCG/2ML IJ SOLN
INTRAMUSCULAR | Status: DC | PRN
  Administered 2018-07-21: 100 ug via INTRAVENOUS

## 2018-07-21 SURGICAL SUPPLY — 15 items

## 2018-07-21 NOTE — Progress Notes (Signed)
Patient off floor to Endo.   

## 2018-07-21 NOTE — Anesthesia Procedure Notes (Signed)
Procedure Name: MAC Date/Time: 07/21/2018 3:45 PM Performed by: Elayne Snare, CRNA Pre-anesthesia Checklist: Patient identified, Emergency Drugs available, Suction available and Patient being monitored Patient Re-evaluated:Patient Re-evaluated prior to induction Oxygen Delivery Method: Nasal cannula

## 2018-07-21 NOTE — Anesthesia Preprocedure Evaluation (Signed)
Anesthesia Evaluation    Reviewed: Allergy & Precautions, Patient's Chart, lab work & pertinent test results  History of Anesthesia Complications Negative for: history of anesthetic complications  Airway Mallampati: II  TM Distance: >3 FB Neck ROM: Full    Dental  (+) Teeth Intact, Dental Advisory Given   Pulmonary former smoker,    Pulmonary exam normal breath sounds clear to auscultation       Cardiovascular hypertension, Normal cardiovascular exam Rhythm:Regular Rate:Normal     Neuro/Psych negative neurological ROS     GI/Hepatic PUD, (+) Cirrhosis   Esophageal Varices  substance abuse  alcohol use, Hepatitis -  Endo/Other  negative endocrine ROS  Renal/GU negative Renal ROS     Musculoskeletal negative musculoskeletal ROS (+)   Abdominal   Peds  Hematology  (+) anemia ,   Anesthesia Other Findings Day of surgery medications reviewed with the patient.  Reproductive/Obstetrics                             Anesthesia Physical Anesthesia Plan  ASA: III  Anesthesia Plan: MAC   Post-op Pain Management:    Induction:   PONV Risk Score and Plan: 1 and Treatment may vary due to age or medical condition and Propofol infusion  Airway Management Planned: Natural Airway and Nasal Cannula  Additional Equipment:   Intra-op Plan:   Post-operative Plan:   Informed Consent: I have reviewed the patients History and Physical, chart, labs and discussed the procedure including the risks, benefits and alternatives for the proposed anesthesia with the patient or authorized representative who has indicated his/her understanding and acceptance.     Dental advisory given  Plan Discussed with: CRNA  Anesthesia Plan Comments: (Spanish interpreter used.)        Anesthesia Quick Evaluation

## 2018-07-21 NOTE — Op Note (Addendum)
[facility] Patient Name: Michael Ayers Procedure Date : 07/21/2018 MRN: [id number] Attending MD: Juanita Craver , MD Date of Birth: October 11, 1975 CSN: [id number] Age: 43 Admit Type: Inpatient Procedure:                Diagnostic EGD. Indications:              Hematemesis, Acute post hemorrhagic anemia,                            Alcoholic cirrhosis s/p TIPS. Providers:                Nelwyn Salisbury, MD, Angus Seller, RN, Tinnie Gens, Technician, Lavona Mound, CRNA Referring MD:             THP Medicines:                Monitored Anesthesia Care Complications:            No immediate complications. Estimated Blood Loss:     Estimated blood loss: none. Procedure:                Pre-Anesthesia Assessment: Prior to the procedure,                            a history and physical was performed, and patient                            medications and allergies were reviewed. The                            patient's tolerance of previous anesthesia was also                            reviewed. The risks and benefits of the procedure                            and the sedation options and risks were discussed                            with the patient. All questions were answered, and                            informed consent was obtained. Prior                            Anticoagulants: The patient has taken no previous                            anticoagulant or antiplatelet agents. ASA Grade                            Assessment: IV - A patient with severe systemic  disease that is a constant threat to life. After                            reviewing the risks and benefits, the patient was                            deemed in satisfactory condition to undergo the                            procedure. After obtaining informed consent, the                            endoscope was passed under direct vision.                  Throughout the procedure, the patient's blood                            pressure, pulse, and oxygen saturations were                            monitored continuously. The GIF-H190 ([id number])                            Olympus gastroscope was introduced through the                            mouth, and advanced to the second part of duodenum.                            The EGD was accomplished without difficulty. The                            patient tolerated the procedure well. Scope In: Scope Out: Findings:      The examined esophagus and GEJ appeared widely patent and without any       evidence of varices.      Moderate portal hypertensive gastropathy was found in the entire       examined stomach.      The cardia and gastric fundus were normal on retroflexion; no fundic       varices were noted.      The examined duodenum was normal.      No fresh or old heme noted on exam. Impression:               - Widely patent esophagus and GEJ; no evidence of                            varices in the esophagus.                           - Portal hypertensive gastropathy; no evidence of                            fundic varices on retroflexion in the cardia.                           -  Normal examined duodenum.                           - No specimens collected. Moderate Sedation:      MAC used. Recommendation:           - Full liquid diet; advance as tolerated .                           - Continue present medications; stop Octreotide                            drip.                           - Change PPI from IV to PO. Procedure Code(s):        --- Professional ---                           [phone], Esophagogastroduodenoscopy, flexible,                            transoral; diagnostic, including collection of                            specimen(s) by brushing or washing, when performed                            (separate procedure) Diagnosis Code(s):        ---  Professional ---                           K92.0, Hematemesis                           D62, Acute posthemorrhagic anemia                           K76.6, Portal hypertension CPT copyright 2018 American Medical Association. All rights reserved. The codes documented in this report are preliminary and upon coder review may  be revised to meet current compliance requirements. Juanita Craver, MD Juanita Craver, MD 07/21/2018 4:12:40 PM This report has been signed electronically. Number of Addenda: 0

## 2018-07-21 NOTE — Transfer of Care (Signed)
Immediate Anesthesia Transfer of Care Note  Patient: Michael Ayers  Procedure(s) Performed: ESOPHAGOGASTRODUODENOSCOPY (EGD) WITH PROPOFOL (N/A )  Patient Location: Endoscopy Unit  Anesthesia Type:MAC  Level of Consciousness: awake, alert  and patient cooperative  Airway & Oxygen Therapy: Patient Spontanous Breathing  Post-op Assessment: Report given to RN and Post -op Vital signs reviewed and stable  Post vital signs: Reviewed and stable  Last Vitals:  Vitals Value Taken Time  BP    Temp    Pulse    Resp    SpO2      Last Pain:  Vitals:   07/21/18 1449  TempSrc: Oral  PainSc: 6       Patients Stated Pain Goal: 3 (62/26/33 3545)  Complications: No apparent anesthesia complications

## 2018-07-21 NOTE — Progress Notes (Signed)
Patient ID: Michael Ayers, male   DOB: [birth year], 43 y.o.   MRN: [id number]  PROGRESS NOTE    [name]  XGZ:[id number] DOB: April 06, 1976 DOA: 07/19/2018 PCP: Patient, No Pcp Per   Brief Narrative:  43 year old male with history of alcohol abuse, cirrhosis of liver, esophageal varices, peptic ulcer disease, duodenitis was transferred from [facility] for coffee-ground emesis.  GI was consulted.  Patient was started on Protonix and octreotide drips.   Assessment & Plan:   Principal Problem:   Hematemesis Active Problems:   Alcohol abuse   Esophageal varices (HCC)   Essential hypertension   Perforated peptic ulcer (HCC)   Probable upper GI bleeding in a patient with history of esophageal varices and peptic ulcer disease -Continue Protonix and octreotide drips.  Also on Rocephin for SBP prophylaxis. -Hemoglobin stable.  For probable EGD today by GI.  History of alcoholic cirrhosis with portal hypertension and esophageal varices with pancytopenia -Follow GI recommendations  Alcohol abuse -Continue CIWA protocol.  Continue thiamine, folic acid and multivitamin.  Social worker consult  Essential hypertension -Monitor blood pressure.  Continue clonidine patch.   DVT prophylaxis: SCDs Code Status: Full Family Communication: None at bedside Disposition Plan: Home in 1 to 3 days most cleared by GI  Consultants: GI  Procedures: None Antimicrobials: Rocephin from 07/19/2018 onwards   Subjective: Patient seen and examined at bedside.  Denies current nausea, vomiting or worsening abdominal pain or fevers.  Objective: Vitals:   07/21/18 0405 07/21/18 0435 07/21/18 0610 07/21/18 0838  BP: 134/82     Pulse:   64   Resp: 15  (!) 21   Temp:    98.9 F (37.2 C)  TempSrc:    Oral  SpO2:   97%   Weight:  76.5 kg    Height:        Intake/Output Summary (Last 24 hours) at 07/21/2018 1104 Last data filed at 07/21/2018 4210 Gross per 24 hour  Intake 1349.6 ml    Output 1575 ml  Net -225.4 ml   Filed Weights   07/19/18 2205 07/21/18 0435  Weight: 77.2 kg 76.5 kg    Examination:  General exam: Sleepy, wakes up on calling his name.  Answers few questions, poor historian. Respiratory system: Bilateral decreased breath sounds at bases Cardiovascular system: S1 & S2 heard, Rate controlled Gastrointestinal system: Abdomen is nondistended, soft and nontender. Normal bowel sounds heard. Extremities: No cyanosis, clubbing, edema    Data Reviewed: I have personally reviewed following labs and imaging studies  CBC: Recent Labs  Lab 07/19/18 2117 07/20/18 0226 07/20/18 1000 07/20/18 1556 07/20/18 2159 07/21/18 0442  WBC 2.2* 2.1*  --   --   --  1.6*  HGB 11.1* 11.2* 11.0* 11.1* 11.2* 11.1*  HCT 33.6* 33.0* 33.9* 33.6* 33.2* 32.8*  MCV 91.3 91.9  --   --   --  92.1  PLT 46* 47*  --   --   --  50*   Basic Metabolic Panel: Recent Labs  Lab 07/19/18 2117 07/20/18 0226 07/21/18 0442  NA 135 138 138  K 3.5 3.7 3.5  CL 105 106 106  CO2 21* 23 23  GLUCOSE 82 77 68*  BUN 8 8 9   CREATININE 1.05 0.98 0.98  CALCIUM 8.4* 8.5* 8.3*   GFR: Estimated Creatinine Clearance: 97.2 mL/min (by C-G formula based on SCr of 0.98 mg/dL). Liver Function Tests: Recent Labs  Lab 07/20/18 0226 07/21/18 0442  AST 69* 56*  ALT 24 21  ALKPHOS  103 101  BILITOT 3.7* 3.4*  PROT 6.8 6.6  ALBUMIN 2.8* 2.7*   No results for input(s): LIPASE, AMYLASE in the last 168 hours. No results for input(s): AMMONIA in the last 168 hours. Coagulation Profile: Recent Labs  Lab 07/19/18 2117  INR 1.46   Cardiac Enzymes: No results for input(s): CKTOTAL, CKMB, CKMBINDEX, TROPONINI in the last 168 hours. BNP (last 3 results) No results for input(s): PROBNP in the last 8760 hours. HbA1C: No results for input(s): HGBA1C in the last 72 hours. CBG: No results for input(s): GLUCAP in the last 168 hours. Lipid Profile: No results for input(s): CHOL, HDL, LDLCALC,  TRIG, CHOLHDL, LDLDIRECT in the last 72 hours. Thyroid Function Tests: No results for input(s): TSH, T4TOTAL, FREET4, T3FREE, THYROIDAB in the last 72 hours. Anemia Panel: No results for input(s): VITAMINB12, FOLATE, FERRITIN, TIBC, IRON, RETICCTPCT in the last 72 hours. Sepsis Labs: No results for input(s): PROCALCITON, LATICACIDVEN in the last 168 hours.  No results found for this or any previous visit (from the past 240 hour(s)).       Radiology Studies: No results found.      Scheduled Meds: . sodium chloride   Intravenous Once  . chlordiazePOXIDE  15 mg Oral TID  . chlorhexidine  15 mL Mouth Rinse BID  . cloNIDine  0.1 mg Transdermal Weekly  . Influenza vac split quadrivalent PF  0.5 mL Intramuscular Tomorrow-1000  . LORazepam  0-4 mg Intravenous Q6H   Followed by  . LORazepam  0-4 mg Intravenous Q12H  . mouth rinse  15 mL Mouth Rinse q12n4p  . pneumococcal 23 valent vaccine  0.5 mL Intramuscular Tomorrow-1000  . sodium chloride flush  3 mL Intravenous Q12H  . thiamine  100 mg Oral Daily   Or  . thiamine  100 mg Intravenous Daily   Continuous Infusions: . cefTRIAXone (ROCEPHIN)  IV 2 g (07/20/18 2234)  . octreotide  (SANDOSTATIN)    IV infusion 50 mcg/hr (07/21/18 0103)  . pantoprozole (PROTONIX) infusion 8 mg/hr (07/21/18 0543)     LOS: 2 days        Michael LloydKshitiz Rebbie Lauricella, MD [facility] Pager [phone]  If 7PM-7AM, please contact night-coverage [email or web address] Password [email or web address] 07/21/2018, 11:04 AM

## 2018-07-21 NOTE — Anesthesia Postprocedure Evaluation (Signed)
Anesthesia Post Note  Patient: Michael Ayers  Procedure(s) Performed: ESOPHAGOGASTRODUODENOSCOPY (EGD) WITH PROPOFOL (N/A )     Patient location during evaluation: PACU Anesthesia Type: MAC Level of consciousness: awake and alert Pain management: pain level controlled Vital Signs Assessment: post-procedure vital signs reviewed and stable Respiratory status: spontaneous breathing, nonlabored ventilation and respiratory function stable Cardiovascular status: blood pressure returned to baseline and stable Postop Assessment: no apparent nausea or vomiting Anesthetic complications: no    Last Vitals:  Vitals:   07/21/18 1204 07/21/18 1449  BP: 139/72 140/71  Pulse: 75 70  Resp: 14 14  Temp:  36.8 C  SpO2: 94% 99%    Last Pain:  Vitals:   07/21/18 1449  TempSrc: Oral  PainSc: [place]

## 2018-07-22 DIAGNOSIS — K7030 Alcoholic cirrhosis of liver without ascites: Secondary | ICD-10-CM

## 2018-07-22 LAB — COMPREHENSIVE METABOLIC PANEL
ALT: 21 U/L (ref 0–44)
AST: 53 U/L — ABNORMAL HIGH (ref 15–41)
Albumin: 2.6 g/dL — ABNORMAL LOW (ref 3.5–5.0)
Alkaline Phosphatase: 95 U/L (ref 38–126)
Anion gap: 8 (ref 5–15)
BUN: 7 mg/dL (ref 6–20)
CO2: 25 mmol/L (ref 22–32)
CREATININE: 0.98 mg/dL (ref 0.61–1.24)
Calcium: 8.2 mg/dL — ABNORMAL LOW (ref 8.9–10.3)
Chloride: 104 mmol/L (ref 98–111)
GFR calc non Af Amer: >60 mL/min (ref >60)
Glucose, Bld: 158 mg/dL — ABNORMAL HIGH (ref 70–99)
Potassium: 3.3 mmol/L — ABNORMAL LOW (ref 3.5–5.1)
Sodium: 137 mmol/L (ref 135–145)
Total Bilirubin: 3.2 mg/dL — ABNORMAL HIGH (ref 0.3–1.2)
Total Protein: 6.5 g/dL (ref 6.5–8.1)

## 2018-07-22 LAB — CBC
HCT: 33.3 % — ABNORMAL LOW (ref 39.0–52.0)
Hemoglobin: 11.1 g/dL — ABNORMAL LOW (ref 13.0–17.0)
MCH: 30.2 pg (ref 26.0–34.0)
MCHC: 33.3 g/dL (ref 30.0–36.0)
MCV: 90.7 fL (ref 80.0–100.0)
NRBC: 0 % (ref 0.0–0.2)
Platelets: 56 10*3/uL — ABNORMAL LOW (ref 150–400)
RBC: 3.67 MIL/uL — ABNORMAL LOW (ref 4.22–5.81)
RDW: 15.2 % (ref 11.5–15.5)
WBC: 1.6 10*3/uL — ABNORMAL LOW (ref 4.0–10.5)

## 2018-07-22 LAB — MAGNESIUM: Magnesium: 1.5 mg/dL — ABNORMAL LOW (ref 1.7–2.4)

## 2018-07-22 MED ORDER — MAGNESIUM SULFATE 2 GM/50ML IV SOLN
2.0000 g | Freq: Once | INTRAVENOUS | Status: AC
  Administered 2018-07-22: 2 g via INTRAVENOUS
  Filled 2018-07-22: qty 50

## 2018-07-22 MED ORDER — POTASSIUM CHLORIDE CRYS ER 20 MEQ PO TBCR
60.0000 meq | EXTENDED_RELEASE_TABLET | Freq: Once | ORAL | Status: AC
  Administered 2018-07-22: 60 meq via ORAL
  Filled 2018-07-22: qty 3

## 2018-07-22 MED ORDER — PANTOPRAZOLE SODIUM 40 MG PO TBEC: 40.0000 mg | DELAYED_RELEASE_TABLET | Freq: Every day | ORAL | 0 refills | Status: DC

## 2018-07-22 MED ORDER — THIAMINE HCL 100 MG PO TABS: 100.0000 mg | ORAL_TABLET | Freq: Every day | ORAL | 0 refills | Status: DC

## 2018-07-22 MED ORDER — CHLORDIAZEPOXIDE HCL 5 MG PO CAPS
10.0000 mg | ORAL_CAPSULE | Freq: Two times a day (BID) | ORAL | Status: DC
  Administered 2018-07-22: 10 mg via ORAL
  Filled 2018-07-22: qty 2

## 2018-07-22 MED ORDER — ONDANSETRON HCL 4 MG PO TABS: 4.0000 mg | ORAL_TABLET | Freq: Four times a day (QID) | ORAL | 0 refills | Status: DC | PRN

## 2018-07-22 MED ORDER — ADULT MULTIVITAMIN W/MINERALS CH: 1.0000 | ORAL_TABLET | Freq: Every day | ORAL | Status: AC

## 2018-07-22 MED ORDER — FOLIC ACID 1 MG PO TABS: 1.0000 mg | ORAL_TABLET | Freq: Every day | ORAL | 0 refills | Status: AC

## 2018-07-22 MED ORDER — CHLORDIAZEPOXIDE HCL 5 MG PO CAPS: ORAL_CAPSULE | ORAL | 0 refills | Status: DC

## 2018-07-22 NOTE — Progress Notes (Signed)
Michael Ayers to be D/C'd to home per MD order.  Discussed with the patient and all questions fully answered.  VSS, Skin clean, dry and intact without evidence of skin break down, no evidence of skin tears noted. IV catheter discontinued intact. Site without signs and symptoms of complications. Dressing and pressure applied.  An After Visit Summary was printed and given to the patient. Patient received prescriptions.  D/c education completed with patient/family including follow up instructions, medication list, d/c activities limitations if indicated, with other d/c instructions as indicated by MD - patient able to verbalize understanding, all questions fully answered.   Patient instructed to return to ED, call 911, or call MD for any changes in condition.   Patient escorted via WC, and D/C home via private auto.  [name] 07/22/2018 10:37 AM

## 2018-07-22 NOTE — Discharge Summary (Signed)
Physician Discharge Summary  Michael Ayers OEV:[id number] DOB: 09/25/1975 DOA: 07/19/2018  PCP: Patient, No Pcp Per  Admit date: 07/19/2018 Discharge date: 07/22/2018  Admitted From: Home Disposition:  Home Recommendations for Outpatient Follow-up:  1. Follow up with PCP in 1 week with repeat CBC/CMP 2. Follow up with GI/Dr. Collene Mares as an outpatient 3. Abstain from alcohol 4. Comply with medications and follow up   Home Health: No  Equipment/Devices: None  Discharge Condition: Stable  CODE STATUS: Full  Diet recommendation: Heart Healthy    Brief/Interim Summary: 43 year old male with history of alcohol abuse, cirrhosis of liver, esophageal varices, peptic ulcer disease, duodenitis was transferred from [facility] for coffee-ground emesis.  GI was consulted.  Patient was started on Protonix and octreotide drips. He had EGD done on 07/21/18. Subsequently he is tolerating diet and Hemoglobin is stable. He will be discharged home with outpatient followup.  Discharge Diagnoses:  Principal Problem:   Hematemesis Active Problems:   Alcohol abuse   Esophageal varices (HCC)   Essential hypertension   Perforated peptic ulcer (HCC)  Probable upper GI bleeding in a patient with history of esophageal varices and peptic ulcer disease -Treated with protonix and octreotide drips.  Also on Rocephin for SBP prophylaxis. -Hemoglobin stable.   -S/P EGD on 07/21/18: Portal hypertensive gastropathy; no varices. Subsequently, he is tolerating diet and Hemoglobin is stable. -Protonix and octreotide drips discontinued. Continue Protonix 51m daily. Outpatient follow up with PCP and GI. Monitor CBC as an outpatient.  History of alcoholic cirrhosis with portal hypertension and esophageal varices with pancytopenia -Outpatient follow up with GI. Noncompliant to meds and followup. Currently not on diuretics. Will need close followup with PCP/GI regarding the same.  Alcohol abuse -On CIWA  protocol.  Continue thiamine, folic acid and multivitamin.  Discharge on few more days of tapering librium.  Essential hypertension -Monitor blood pressure.  outpatient followup.  Discharge Instructions  Discharge Instructions    Call MD for:  persistant nausea and vomiting   Complete by:  As directed    Call MD for:  severe uncontrolled pain   Complete by:  As directed    Call MD for:  temperature >100.4   Complete by:  As directed    Diet - low sodium heart healthy   Complete by:  As directed    Increase activity slowly   Complete by:  As directed      Allergies as of 07/22/2018   No Known Allergies     Medication List    STOP taking these medications   ibuprofen 600 MG tablet Commonly known as:  ADVIL,MOTRIN   lactulose 10 GM/15ML solution Commonly known as:  CHRONULAC   nadolol 20 MG tablet Commonly known as:  CORGARD     TAKE these medications   chlordiazePOXIDE 5 MG capsule Commonly known as:  LIBRIUM 130mBIDx1day then 43m71mIDx1day then 43mg20mily then stop   folic acid 1 MG tablet Commonly known as:  FOLVITE Take 1 tablet (1 mg total) by mouth daily.   multivitamin with minerals Tabs tablet Take 1 tablet by mouth daily.   ondansetron 4 MG tablet Commonly known as:  ZOFRAN Take 1 tablet (4 mg total) by mouth every 6 (six) hours as needed for nausea.   pantoprazole 40 MG tablet Commonly known as:  PROTONIX Take 1 tablet (40 mg total) by mouth daily.   thiamine 100 MG tablet Take 1 tablet (100 mg total) by mouth daily.      Follow-up  Information    [facility]. Go on 07/29/2018.   Why:  1:50 pm, Michael Caldron PA Contact information: [place] [phone]       Michael Craver, MD. Schedule an appointment as soon as possible for a visit in 1 week(s).   Specialty:  Gastroenterology Contact information: [address] [place]  [address] [phone]          No Known Allergies  Consultations:  GI   Procedures/Studies: EGD on 07/21/18 Impression:               - Widely patent esophagus and GEJ; no evidence of                            varices in the esophagus.                           - Portal hypertensive gastropathy; no evidence of                            fundic varices on retroflexion in the cardia.                           - Normal examined duodenum.                           - No specimens collected. Moderate Sedation:      MAC used. Recommendation:           - Full liquid diet; advance as tolerated .                           - Continue present medications; stop Octreotide                            drip.                           - Change PPI from IV to PO.   Subjective: Patient seen and examined at bedside.  Denies current nausea, vomiting or worsening abdominal pain or fevers. Tolerating diet. Is ok going home.   Discharge Exam: Vitals:   07/22/18 0458 07/22/18 0833  BP:    Pulse:    Resp:    Temp: 97.8 F (36.6 C) 98.3 F (36.8 C)  SpO2:     Vitals:   07/22/18 0058 07/22/18 0426 07/22/18 0458 07/22/18 0833  BP: 128/67 107/83    Pulse: 70 69    Resp: 12 18    Temp: 98.2 F (36.8 C) 97.9 F (36.6 C) 97.8 F (36.6 C) 98.3 F (36.8 C)  TempSrc: Oral Oral  Oral  SpO2: 96% 99%    Weight:      Height:        General: Pt is alert, awake, not in acute distress. Poor historian Cardiovascular: rate controlled, S1/S2 + Respiratory: bilateral decreased breath sounds at bases Abdominal: Soft, NT, ND, bowel sounds + Extremities: no edema, no cyanosis    The results of significant diagnostics from this hospitalization (including imaging, microbiology, ancillary and laboratory) are listed below for reference.     Microbiology: No  results found for this or any previous visit (from the past 240 hour(s)).   Labs: BNP (last 3 results) No results for input(s): BNP in the  last 8760 hours. Basic Metabolic Panel: Recent Labs  Lab 07/19/18 2117 07/20/18 0226 07/21/18 0442 07/22/18 0410  NA 135 138 138 137  K 3.5 3.7 3.5 3.3*  CL 105 106 106 104  CO2 21* _0 GLUCOSE 82 77 68* 158*  BUN _1 CREATININE 1.05 0.98 0.98 0.98  CALCIUM 8.4* 8.5* 8.3* 8.2*  MG  --   --   --  1.5*   Liver Function Tests: Recent Labs  Lab 07/20/18 0226 07/21/18 0442 07/22/18 0410  AST 69* 56* 53*  ALT _2 ALKPHOS 103 101 95  BILITOT 3.7* 3.4* 3.2*  PROT 6.8 6.6 6.5  ALBUMIN 2.8* 2.7* 2.6*   No results for input(s): LIPASE, AMYLASE in the last 168 hours. No results for input(s): AMMONIA in the last 168 hours. CBC: Recent Labs  Lab 07/19/18 2117 07/20/18 0226 07/20/18 1000 07/20/18 1556 07/20/18 2159 07/21/18 0442 07/22/18 0410  WBC 2.2* 2.1*  --   --   --  1.6* 1.6*  HGB 11.1* 11.2* 11.0* 11.1* 11.2* 11.1* 11.1*  HCT 33.6* 33.0* 33.9* 33.6* 33.2* 32.8* 33.3*  MCV 91.3 91.9  --   --   --  92.1 90.7  PLT 46* 47*  --   --   --  50* 56*   Cardiac Enzymes: No results for input(s): CKTOTAL, CKMB, CKMBINDEX, TROPONINI in the last 168 hours. BNP: Invalid input(s): POCBNP CBG: No results for input(s): GLUCAP in the last 168 hours. D-Dimer No results for input(s): DDIMER in the last 72 hours. Hgb A1c No results for input(s): HGBA1C in the last 72 hours. Lipid Profile No results for input(s): CHOL, HDL, LDLCALC, TRIG, CHOLHDL, LDLDIRECT in the last 72 hours. Thyroid function studies No results for input(s): TSH, T4TOTAL, T3FREE, THYROIDAB in the last 72 hours.  Invalid input(s): FREET3 Anemia work up No results for input(s): VITAMINB12, FOLATE, FERRITIN, TIBC, IRON, RETICCTPCT in the last 72 hours. Urinalysis    Component Value Date/Time   COLORURINE YELLOW 04/17/2018 [place] 04/17/2018 2314   LABSPEC 1.003 (L) 04/17/2018 2314   PHURINE 7.0 04/17/2018 2314   GLUCOSEU NEGATIVE 04/17/2018 2314   HGBUR NEGATIVE  04/17/2018 2314   BILIRUBINUR NEGATIVE 04/17/2018 2314   KETONESUR NEGATIVE 04/17/2018 2314   PROTEINUR NEGATIVE 04/17/2018 2314   UROBILINOGEN 1.0 03/27/2011 1714   NITRITE NEGATIVE 04/17/2018 2314   LEUKOCYTESUR NEGATIVE 04/17/2018 2314   Sepsis Labs Invalid input(s): PROCALCITONIN,  WBC,  LACTICIDVEN Microbiology No results found for this or any previous visit (from the past 240 hour(s)).   Time coordinating discharge: 35 minutes  SIGNED:   Aline August, MD  [facility] 07/22/2018, 10:01 AM Pager: [phone]  If 7PM-7AM, please contact night-coverage [email or web address] Password [email or web address]

## 2018-07-28 NOTE — Progress Notes (Deleted)
Patient ID: Michael Ayers, male   DOB: 1975/12/11, 43 y.o.   MRN: [id number]   After hospitalization 07/19/2018-07/22/2018.  From discharge summary: Brief/Interim Summary: 43 year old male with history of alcohol abuse, cirrhosis of liver, esophageal varices, peptic ulcer disease, duodenitis was transferred from [facility] for coffee-ground emesis. GI was consulted. Patient was started on Protonix and octreotide drips. He had EGD done on 07/21/18. Subsequently he is tolerating diet and Hemoglobin is stable. He will be discharged home with outpatient followup.  Discharge Diagnoses:  Principal Problem:   Hematemesis Active Problems:   Alcohol abuse   Esophageal varices (HCC)   Essential hypertension   Perforated peptic ulcer (HCC)  Probable upper GI bleeding in a patient with history of esophageal varices and peptic ulcer disease -Treated with protonix and octreotide drips. Also on Rocephin for SBP prophylaxis. -Hemoglobin stable.  -S/P EGD on 07/21/18: Portal hypertensive gastropathy; no varices. Subsequently, he is tolerating diet and Hemoglobin is stable. -Protonix and octreotide drips discontinued. Continue Protonix 40mg  daily. Outpatient follow up with PCP and GI. Monitor CBC as an outpatient.  History of alcoholic cirrhosis with portal hypertension and esophageal varices with pancytopenia -Outpatient follow up with GI. Noncompliant to meds and followup. Currently not on diuretics. Will need close followup with PCP/GI regarding the same.  Alcohol abuse -On CIWA protocol. Continue thiamine, folic acid and multivitamin. Discharge on few more days of tapering librium.  Essential hypertension -Monitor blood pressure. outpatient followup.

## 2018-07-29 ENCOUNTER — Inpatient Hospital Stay: Payer: Self-pay

## 2018-08-14 ENCOUNTER — Encounter (HOSPITAL_COMMUNITY): Payer: Self-pay | Admitting: Emergency Medicine

## 2018-08-14 ENCOUNTER — Other Ambulatory Visit: Payer: Self-pay

## 2018-08-14 ENCOUNTER — Emergency Department (HOSPITAL_COMMUNITY)
Admission: EM | Admit: 2018-08-14 | Discharge: 2018-08-14 | Disposition: A | Discharge status: Home / Self Care | Payer: Self-pay | Attending: Emergency Medicine | Admitting: Emergency Medicine

## 2018-08-14 DIAGNOSIS — Z87891 Personal history of nicotine dependence: Secondary | ICD-10-CM | POA: Insufficient documentation

## 2018-08-14 DIAGNOSIS — I10 Essential (primary) hypertension: Secondary | ICD-10-CM | POA: Insufficient documentation

## 2018-08-14 DIAGNOSIS — Z79899 Other long term (current) drug therapy: Secondary | ICD-10-CM | POA: Insufficient documentation

## 2018-08-14 DIAGNOSIS — K611 Rectal abscess: Secondary | ICD-10-CM | POA: Insufficient documentation

## 2018-08-14 MED ORDER — CEPHALEXIN 500 MG PO CAPS: 500.0000 mg | ORAL_CAPSULE | Freq: Four times a day (QID) | ORAL | 0 refills | Status: DC

## 2018-08-14 MED ORDER — SULFAMETHOXAZOLE-TRIMETHOPRIM 800-160 MG PO TABS: 1.0000 | ORAL_TABLET | Freq: Two times a day (BID) | ORAL | 0 refills | Status: AC

## 2018-08-14 MED ORDER — IBUPROFEN 600 MG PO TABS: 600.0000 mg | ORAL_TABLET | Freq: Four times a day (QID) | ORAL | 0 refills | Status: AC | PRN

## 2018-08-14 MED ORDER — DOCUSATE SODIUM 250 MG PO CAPS: 250.0000 mg | ORAL_CAPSULE | Freq: Every day | ORAL | 0 refills | Status: AC

## 2018-08-14 MED ORDER — LIDOCAINE-EPINEPHRINE (PF) 2 %-1:200000 IJ SOLN
10.0000 mL | Freq: Once | INTRAMUSCULAR | Status: AC
  Administered 2018-08-14: 10 mL
  Filled 2018-08-14: qty 20

## 2018-08-14 NOTE — ED Provider Notes (Signed)
[name] Provider Note   CSN: [id number] Arrival date & time: 08/14/18  1904     History   Chief Complaint No chief complaint on file.   HPI Michael Ayers is a 43 y.o. male.  43 year old male with prior medical history as detailed below presents for evaluation of perirectal pain and inflammation.  Patient reports prior history of perirectal abscess.  His last abscess was drained in October 2019.  Patient reports with recurrent pain to same area.  He is concerned that he has another abscess.  He denies associated fever.  The history is provided by the patient and medical records.  Illness  Location:  Rectal pain and swelling Severity:  Mild Onset quality:  Gradual Duration:  4 days Timing:  Constant Progression:  Worsening Chronicity:  Recurrent   Past Medical History:  Diagnosis Date  . Alcohol abuse   . Duodenitis   . Esophageal varices (HCC)   . Essential hypertension   . Headache    "sometimes daily" (07/19/2018)  . Hepatitis    "don't remember which one it was" (07/19/2018)  . Liver cirrhosis (HCC)   . Perforated peptic ulcer (HCC)     Patient Active Problem List   Diagnosis Date Noted  . GIB (gastrointestinal bleeding) 07/19/2018  . Perforated peptic ulcer (HCC)   . 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 Anemia 03/29/2018  . Abdominal pain 03/27/2018  . Abnormal LFTs 01/15/2018  . Thrombocytopenia (HCC) 01/15/2018  . Hematemesis 01/14/2018  . Alcohol abuse   . Esophageal varices (HCC)   . Duodenitis   . Essential hypertension     Past Surgical History:  Procedure Laterality Date  . ESOPHAGOGASTRODUODENOSCOPY (EGD) WITH PROPOFOL N/A 01/16/2018   Procedure: ESOPHAGOGASTRODUODENOSCOPY (EGD) WITH PROPOFOL;  Surgeon: Vida RiggerMagod, Marc, MD;  [facility];  Service: Endoscopy;  Laterality: N/A;  . ESOPHAGOGASTRODUODENOSCOPY (EGD) WITH PROPOFOL N/A 03/27/2018   Procedure:  ESOPHAGOGASTRODUODENOSCOPY (EGD) WITH PROPOFOL;  Surgeon: Carman ChingEdwards, James, MD;  [facility];  Service: Endoscopy;  Laterality: N/A;  . ESOPHAGOGASTRODUODENOSCOPY (EGD) WITH PROPOFOL N/A 07/21/2018   Procedure: ESOPHAGOGASTRODUODENOSCOPY (EGD) WITH PROPOFOL;  Surgeon: Charna ElizabethMann, Jyothi, MD;  [facility];  Service: Endoscopy;  Laterality: N/A;  . INCISION AND DRAINAGE ABSCESS ANAL  04/17/2018  . IR ANGIOGRAM FOLLOW UP STUDY  04/09/2018  . IR ANGIOGRAM SELECTIVE EACH ADDITIONAL VESSEL  03/30/2018  . IR ANGIOGRAM SELECTIVE EACH ADDITIONAL VESSEL  03/30/2018  . IR ANGIOGRAM SELECTIVE EACH ADDITIONAL VESSEL  04/09/2018  . IR ANGIOGRAM SELECTIVE EACH ADDITIONAL VESSEL  04/09/2018  . IR ANGIOGRAM SELECTIVE EACH ADDITIONAL VESSEL  04/09/2018  . IR ANGIOGRAM SELECTIVE EACH ADDITIONAL VESSEL  04/09/2018  . IR EMBO ART  VEN HEMORR LYMPH EXTRAV  INC GUIDE ROADMAPPING  04/09/2018  . IR RADIOLOGIST EVAL & MGMT  05/20/2018  . IR TIPS  04/09/2018  . IR US GUIDE VASC ACCESS RIGHT  03/30/2018  . IR US GUIDE VASC ACCESS RIGHT  04/09/2018  . IR US GUIDE VASC ACCESS RIGHT  04/09/2018  . IR US GUIDE VASC ACCESS RIGHT  04/09/2018  . IR VENOGRAM ADRENAL UNI LEFT  03/30/2018  . IR VENOGRAM RENAL UNI LEFT  03/30/2018  . IR VENOGRAM RENAL UNI LEFT  04/09/2018  . RADIOLOGY WITH ANESTHESIA N/A 04/09/2018   Procedure: IR TIPS/BRTO;  Surgeon: Malachy MoanMcCullough, Heath, MD;  [facility];  Service: Radiology;  Laterality: N/A;  . REPAIR OF PERFORATED ULCER  ~ 2015   peptic ulcer  Home Medications    Prior to Admission medications   Medication Sig Start Date End Date Taking? Authorizing Provider  cephALEXin (KEFLEX) 500 MG capsule Take 1 capsule (500 mg total) by mouth 4 (four) times daily. 08/14/18   Wynetta Fines, MD  chlordiazePOXIDE (LIBRIUM) 5 MG capsule 10mg  BIDx1day then 5mg  BIDx1day then 5mg  daily then stop 07/22/18   Glade Lloyd, MD  docusate sodium (COLACE) 250 MG capsule Take 1 capsule (250 mg total)  by mouth daily. 08/14/18   Wynetta Fines, MD  folic acid (FOLVITE) 1 MG tablet Take 1 tablet (1 mg total) by mouth daily. 07/22/18   Glade Lloyd, MD  ibuprofen (ADVIL,MOTRIN) 600 MG tablet Take 1 tablet (600 mg total) by mouth every 6 (six) hours as needed. 08/14/18   Wynetta Fines, MD  Multiple Vitamin (MULTIVITAMIN WITH MINERALS) TABS tablet Take 1 tablet by mouth daily. 07/22/18   Glade Lloyd, MD  ondansetron (ZOFRAN) 4 MG tablet Take 1 tablet (4 mg total) by mouth every 6 (six) hours as needed for nausea. 07/22/18   Glade Lloyd, MD  pantoprazole (PROTONIX) 40 MG tablet Take 1 tablet (40 mg total) by mouth daily. 07/22/18   Glade Lloyd, MD  sulfamethoxazole-trimethoprim (BACTRIM DS,SEPTRA DS) 800-160 MG tablet Take 1 tablet by mouth 2 (two) times daily for 7 days. 08/14/18 08/21/18  Wynetta Fines, MD  thiamine 100 MG tablet Take 1 tablet (100 mg total) by mouth daily. 07/22/18   Glade Lloyd, MD    Family History Family History  Problem Relation Age of Onset  . Diabetes Mellitus II Mother     Social History Social History   Tobacco Use  . Smoking status: Former Smoker    Packs/day: 0.33    Years: 10.00    Pack years: 3.30    Types: Cigarettes    Last attempt to quit: 2004    Years since quitting: 16.1  . Smokeless tobacco: Never Used  Substance Use Topics  . Alcohol use: Yes    Alcohol/week: 35.0 standard drinks    Types: 35 Cans of beer per week    Comment: 07/19/2018 "4-5 beers/day"  . Drug use: Never     Allergies   Patient has no known allergies.   Review of Systems Review of Systems  All other systems reviewed and are negative.    Physical Exam Updated Vital Signs BP (!) 150/83 (BP Location: Right Arm)   Pulse 89   Temp 99.4 F (37.4 C) (Oral)   Resp 14   SpO2 99%   Physical Exam Vitals signs and nursing note reviewed.  Constitutional:      General: He is not in acute distress.    Appearance: Normal appearance. He is well-developed.   HENT:     Head: Normocephalic and atraumatic.  Eyes:     Conjunctiva/sclera: Conjunctivae normal.     Pupils: Pupils are equal, round, and reactive to light.  Neck:     Musculoskeletal: Normal range of motion and neck supple.  Cardiovascular:     Rate and Rhythm: Normal rate and regular rhythm.     Heart sounds: Normal heart sounds.  Pulmonary:     Effort: Pulmonary effort is normal. No respiratory distress.     Breath sounds: Normal breath sounds.  Abdominal:     General: There is no distension.     Palpations: Abdomen is soft.     Tenderness: There is no abdominal tenderness.  Genitourinary:    Comments: 3-4cm area that  is a fluctuant area to the peritoneal area at approximately 11 o'clock. Musculoskeletal: Normal range of motion.        General: No deformity.  Skin:    General: Skin is warm and dry.  Neurological:     General: No focal deficit present.     Mental Status: He is alert and oriented to person, place, and time. Mental status is at baseline.      ED Treatments / Results  Labs (all labs ordered are listed, but only abnormal results are displayed) Labs Reviewed - No data to display  EKG None  Radiology No results found.  Procedures .Marland KitchenIncision and Drainage Date/Time: 08/14/2018 9:30 PM Performed by: Wynetta Fines, MD Authorized by: Wynetta Fines, MD   Consent:    Consent obtained:  Verbal   Consent given by:  Patient   Risks discussed:  Bleeding, damage to other organs, incomplete drainage, infection and pain   Alternatives discussed:  No treatment Location:    Type:  Abscess   Size:  4cm   Location:  Head   Head/neck location: Perirectal. Pre-procedure details:    Skin preparation:  Betadine Anesthesia (see MAR for exact dosages):    Anesthesia method:  None Procedure type:    Complexity:  Simple Procedure details:    Needle aspiration: yes     Needle size:  18 G   Incision types:  Stab incision   Scalpel blade:  11   Wound  management:  Probed and deloculated, irrigated with saline and extensive cleaning   Drainage:  Purulent   Drainage amount:  Moderate   Wound treatment:  Drain placed   Packing materials:  1/4 in gauze Post-procedure details:    Patient tolerance of procedure:  Tolerated well, no immediate complications   (including critical care time)  Medications Ordered in ED Medications  lidocaine-EPINEPHrine (XYLOCAINE W/EPI) 2 %-1:200000 (PF) injection 10 mL (10 mLs Infiltration Given 08/14/18 1950)     Initial Impression / Assessment and Plan / ED Course  I have reviewed the triage vital signs and the nursing notes.  Pertinent labs & imaging results that were available during my care of the patient were reviewed by me and considered in my medical decision making (see chart for details).     MDM  Screen complete  Patient with complaint of perirectal pain and swelling.  Exam suggests perirectal abscess.  The abscess was I&D at the bedside without difficulty.  Tolerated the procedure well.  He understands the need for close follow-up.  Strict return precautions given and understood.  Final Clinical Impressions(s) / ED Diagnoses   Final diagnoses:  Peri-rectal abscess    ED Discharge Orders         Ordered    sulfamethoxazole-trimethoprim (BACTRIM DS,SEPTRA DS) 800-160 MG tablet  2 times daily     08/14/18 2126    cephALEXin (KEFLEX) 500 MG capsule  4 times daily     08/14/18 2126    ibuprofen (ADVIL,MOTRIN) 600 MG tablet  Every 6 hours PRN     08/14/18 2126    docusate sodium (COLACE) 250 MG capsule  Daily     08/14/18 2126           Wynetta Fines, MD 08/14/18 2132

## 2018-08-14 NOTE — ED Triage Notes (Signed)
Pt presents to ED with complaints of an abscess on his bottom.

## 2018-08-14 NOTE — ED Notes (Signed)
Patient verbalizes understanding of discharge instructions. Opportunity for questioning and answers were provided. Armband removed by staff, pt discharged from ED ambulatory to home.  

## 2018-08-14 NOTE — Discharge Instructions (Addendum)
Please return for any problem.  Follow-up with your regular care provider as instructed. °

## 2018-08-19 ENCOUNTER — Other Ambulatory Visit: Payer: Self-pay | Admitting: Interventional Radiology

## 2018-08-19 DIAGNOSIS — I864 Gastric varices: Secondary | ICD-10-CM

## 2018-08-25 ENCOUNTER — Encounter: Payer: Self-pay | Admitting: Radiology

## 2018-11-15 ENCOUNTER — Inpatient Hospital Stay (HOSPITAL_COMMUNITY)
Admission: AD | Admit: 2018-11-15 | Discharge: 2018-11-25 | DRG: 177 | Disposition: A | Discharge status: Home / Self Care | Payer: HRSA Program | Source: Other Acute Inpatient Hospital | Attending: Internal Medicine | Admitting: Internal Medicine

## 2018-11-15 DIAGNOSIS — U071 COVID-19: Principal | ICD-10-CM | POA: Diagnosis present

## 2018-11-15 DIAGNOSIS — J9601 Acute respiratory failure with hypoxia: Secondary | ICD-10-CM | POA: Diagnosis present

## 2018-11-15 DIAGNOSIS — K3189 Other diseases of stomach and duodenum: Secondary | ICD-10-CM | POA: Diagnosis present

## 2018-11-15 DIAGNOSIS — F1010 Alcohol abuse, uncomplicated: Secondary | ICD-10-CM | POA: Diagnosis not present

## 2018-11-15 DIAGNOSIS — R945 Abnormal results of liver function studies: Secondary | ICD-10-CM | POA: Diagnosis not present

## 2018-11-15 DIAGNOSIS — D696 Thrombocytopenia, unspecified: Secondary | ICD-10-CM | POA: Diagnosis present

## 2018-11-15 DIAGNOSIS — N179 Acute kidney failure, unspecified: Secondary | ICD-10-CM | POA: Diagnosis present

## 2018-11-15 DIAGNOSIS — I8510 Secondary esophageal varices without bleeding: Secondary | ICD-10-CM | POA: Diagnosis present

## 2018-11-15 DIAGNOSIS — Z87891 Personal history of nicotine dependence: Secondary | ICD-10-CM

## 2018-11-15 DIAGNOSIS — J069 Acute upper respiratory infection, unspecified: Secondary | ICD-10-CM | POA: Diagnosis not present

## 2018-11-15 DIAGNOSIS — I10 Essential (primary) hypertension: Secondary | ICD-10-CM | POA: Diagnosis present

## 2018-11-15 DIAGNOSIS — K612 Anorectal abscess: Secondary | ICD-10-CM | POA: Diagnosis present

## 2018-11-15 DIAGNOSIS — Z9114 Patient's other noncompliance with medication regimen: Secondary | ICD-10-CM | POA: Diagnosis not present

## 2018-11-15 DIAGNOSIS — E663 Overweight: Secondary | ICD-10-CM | POA: Diagnosis present

## 2018-11-15 DIAGNOSIS — F1011 Alcohol abuse, in remission: Secondary | ICD-10-CM | POA: Diagnosis present

## 2018-11-15 DIAGNOSIS — Z6825 Body mass index (BMI) 25.0-25.9, adult: Secondary | ICD-10-CM

## 2018-11-15 DIAGNOSIS — Z8711 Personal history of peptic ulcer disease: Secondary | ICD-10-CM | POA: Diagnosis not present

## 2018-11-15 DIAGNOSIS — Z833 Family history of diabetes mellitus: Secondary | ICD-10-CM

## 2018-11-15 DIAGNOSIS — J1289 Other viral pneumonia: Secondary | ICD-10-CM | POA: Diagnosis present

## 2018-11-15 DIAGNOSIS — D6959 Other secondary thrombocytopenia: Secondary | ICD-10-CM | POA: Diagnosis present

## 2018-11-15 DIAGNOSIS — R7989 Other specified abnormal findings of blood chemistry: Secondary | ICD-10-CM | POA: Diagnosis present

## 2018-11-15 DIAGNOSIS — I8500 Esophageal varices without bleeding: Secondary | ICD-10-CM | POA: Diagnosis present

## 2018-11-15 DIAGNOSIS — R0902 Hypoxemia: Secondary | ICD-10-CM

## 2018-11-15 DIAGNOSIS — R509 Fever, unspecified: Secondary | ICD-10-CM | POA: Diagnosis present

## 2018-11-16 ENCOUNTER — Encounter (HOSPITAL_COMMUNITY): Payer: Self-pay

## 2018-11-16 ENCOUNTER — Other Ambulatory Visit: Payer: Self-pay

## 2018-11-16 DIAGNOSIS — I10 Essential (primary) hypertension: Secondary | ICD-10-CM

## 2018-11-16 DIAGNOSIS — D696 Thrombocytopenia, unspecified: Secondary | ICD-10-CM

## 2018-11-16 DIAGNOSIS — R945 Abnormal results of liver function studies: Secondary | ICD-10-CM

## 2018-11-16 DIAGNOSIS — J9601 Acute respiratory failure with hypoxia: Secondary | ICD-10-CM

## 2018-11-16 DIAGNOSIS — E663 Overweight: Secondary | ICD-10-CM | POA: Diagnosis present

## 2018-11-16 DIAGNOSIS — U071 COVID-19: Principal | ICD-10-CM

## 2018-11-16 DIAGNOSIS — N179 Acute kidney failure, unspecified: Secondary | ICD-10-CM

## 2018-11-16 DIAGNOSIS — F1010 Alcohol abuse, uncomplicated: Secondary | ICD-10-CM

## 2018-11-16 LAB — COMPREHENSIVE METABOLIC PANEL
ALT: 23 U/L (ref 0–44)
AST: 70 U/L — ABNORMAL HIGH (ref 15–41)
Albumin: 2.4 g/dL — ABNORMAL LOW (ref 3.5–5.0)
Alkaline Phosphatase: 117 U/L (ref 38–126)
Anion gap: 8 (ref 5–15)
BUN: 14 mg/dL (ref 6–20)
CO2: 23 mmol/L (ref 22–32)
Calcium: 8 mg/dL — ABNORMAL LOW (ref 8.9–10.3)
Chloride: 106 mmol/L (ref 98–111)
Creatinine, Ser: 1.1 mg/dL (ref 0.61–1.24)
GFR calc Af Amer: >60 mL/min (ref >60)
GFR calc non Af Amer: >60 mL/min (ref >60)
Glucose, Bld: 85 mg/dL (ref 70–99)
Potassium: 5.2 mmol/L — ABNORMAL HIGH (ref 3.5–5.1)
Sodium: 137 mmol/L (ref 135–145)
Total Bilirubin: 2.9 mg/dL — ABNORMAL HIGH (ref 0.3–1.2)
Total Protein: 6.4 g/dL — ABNORMAL LOW (ref 6.5–8.1)

## 2018-11-16 LAB — POCT I-STAT 7, (LYTES, BLD GAS, ICA,H+H)
Acid-base deficit: 3 mmol/L — ABNORMAL HIGH (ref 0.0–2.0)
Bicarbonate: 20.9 mmol/L (ref 20.0–28.0)
Calcium, Ion: 1.14 mmol/L — ABNORMAL LOW (ref 1.15–1.40)
HCT: 33 % — ABNORMAL LOW (ref 39.0–52.0)
Hemoglobin: 11.2 g/dL — ABNORMAL LOW (ref 13.0–17.0)
O2 Saturation: 91 %
Patient temperature: 99
Potassium: 4.3 mmol/L (ref 3.5–5.1)
Sodium: 138 mmol/L (ref 135–145)
TCO2: 22 mmol/L (ref 22–32)
pCO2 arterial: 32.7 mmHg (ref 32.0–48.0)
pH, Arterial: 7.415 (ref 7.350–7.450)
pO2, Arterial: 60 mmHg — ABNORMAL LOW (ref 83.0–108.0)

## 2018-11-16 LAB — STREP PNEUMONIAE URINARY ANTIGEN: Strep Pneumo Urinary Antigen: NEGATIVE

## 2018-11-16 LAB — HEPATIC FUNCTION PANEL
ALT: 23 U/L (ref 0–44)
AST: 70 U/L — ABNORMAL HIGH (ref 15–41)
Albumin: 2.5 g/dL — ABNORMAL LOW (ref 3.5–5.0)
Alkaline Phosphatase: 116 U/L (ref 38–126)
Bilirubin, Direct: 1.8 mg/dL — ABNORMAL HIGH (ref 0.0–0.2)
Indirect Bilirubin: 1.3 mg/dL — ABNORMAL HIGH (ref 0.3–0.9)
Total Bilirubin: 3.1 mg/dL — ABNORMAL HIGH (ref 0.3–1.2)
Total Protein: 6.5 g/dL (ref 6.5–8.1)

## 2018-11-16 LAB — CBC WITH DIFFERENTIAL/PLATELET
Abs Immature Granulocytes: 0.07 10*3/uL (ref 0.00–0.07)
Basophils Absolute: 0 10*3/uL (ref 0.0–0.1)
Basophils Relative: 1 %
Eosinophils Absolute: 0 10*3/uL (ref 0.0–0.5)
Eosinophils Relative: 1 %
HCT: 38 % — ABNORMAL LOW (ref 39.0–52.0)
Hemoglobin: 12.7 g/dL — ABNORMAL LOW (ref 13.0–17.0)
Immature Granulocytes: 2 %
Lymphocytes Relative: 13 %
Lymphs Abs: 0.5 10*3/uL — ABNORMAL LOW (ref 0.7–4.0)
MCH: 29.6 pg (ref 26.0–34.0)
MCHC: 33.4 g/dL (ref 30.0–36.0)
MCV: 88.6 fL (ref 80.0–100.0)
Monocytes Absolute: 0.4 10*3/uL (ref 0.1–1.0)
Monocytes Relative: 9 %
Neutro Abs: 3 10*3/uL (ref 1.7–7.7)
Neutrophils Relative %: 74 %
Platelets: 73 10*3/uL — ABNORMAL LOW (ref 150–400)
RBC: 4.29 MIL/uL (ref 4.22–5.81)
RDW: 15.3 % (ref 11.5–15.5)
WBC: 4 10*3/uL (ref 4.0–10.5)
nRBC: 0 % (ref 0.0–0.2)

## 2018-11-16 LAB — D-DIMER, QUANTITATIVE: D-Dimer, Quant: 2.12 ug/mL-FEU — ABNORMAL HIGH (ref 0.00–0.50)

## 2018-11-16 LAB — PROCALCITONIN: Procalcitonin: 0.62 ng/mL

## 2018-11-16 LAB — GAMMA GT: GGT: 167 U/L — ABNORMAL HIGH (ref 7–50)

## 2018-11-16 LAB — C-REACTIVE PROTEIN: CRP: 12 mg/dL — ABNORMAL HIGH (ref <1.0)

## 2018-11-16 LAB — MRSA PCR SCREENING: MRSA by PCR: NEGATIVE

## 2018-11-16 LAB — FERRITIN: Ferritin: 89 ng/mL (ref 24–336)

## 2018-11-16 LAB — ABO/RH: ABO/RH(D): AB POS

## 2018-11-16 MED ORDER — ONDANSETRON HCL 4 MG PO TABS: 4.0000 mg | ORAL_TABLET | Freq: Four times a day (QID) | ORAL | Status: DC | PRN

## 2018-11-16 MED ORDER — LORAZEPAM 0.5 MG PO TABS: 1.0000 mg | ORAL_TABLET | Freq: Four times a day (QID) | ORAL | Status: DC | PRN

## 2018-11-16 MED ORDER — ONDANSETRON HCL 4 MG/2ML IJ SOLN: 4.0000 mg | Freq: Four times a day (QID) | INTRAMUSCULAR | Status: DC | PRN

## 2018-11-16 MED ORDER — LORAZEPAM 2 MG/ML IJ SOLN: 1.0000 mg | Freq: Four times a day (QID) | INTRAMUSCULAR | Status: DC | PRN

## 2018-11-16 MED ORDER — SODIUM CHLORIDE 0.9 % IV SOLN
100.0000 mg | Freq: Two times a day (BID) | INTRAVENOUS | Status: DC
  Administered 2018-11-16 – 2018-11-23 (×15): 100 mg via INTRAVENOUS
  Filled 2018-11-16 (×16): qty 100

## 2018-11-16 MED ORDER — SODIUM POLYSTYRENE SULFONATE 15 GM/60ML PO SUSP
30.0000 g | Freq: Once | ORAL | Status: AC
  Administered 2018-11-16: 14:00:00 30 g via ORAL
  Filled 2018-11-16: qty 120

## 2018-11-16 MED ORDER — PANTOPRAZOLE SODIUM 40 MG PO TBEC
40.0000 mg | DELAYED_RELEASE_TABLET | Freq: Two times a day (BID) | ORAL | Status: DC
  Administered 2018-11-16 – 2018-11-25 (×19): 40 mg via ORAL
  Filled 2018-11-16 (×20): qty 1

## 2018-11-16 MED ORDER — SODIUM CHLORIDE 0.9 % IV SOLN
INTRAVENOUS | Status: DC
  Administered 2018-11-16 – 2018-11-23 (×3): via INTRAVENOUS

## 2018-11-16 MED ORDER — TOCILIZUMAB 400 MG/20ML IV SOLN
600.0000 mg | Freq: Once | INTRAVENOUS | Status: AC
  Administered 2018-11-16: 14:00:00 600 mg via INTRAVENOUS
  Filled 2018-11-16: qty 20

## 2018-11-16 MED ORDER — FOLIC ACID 1 MG PO TABS
1.0000 mg | ORAL_TABLET | Freq: Every day | ORAL | Status: DC
  Administered 2018-11-16 – 2018-11-25 (×10): 1 mg via ORAL
  Filled 2018-11-16 (×11): qty 1

## 2018-11-16 MED ORDER — BOOST / RESOURCE BREEZE PO LIQD CUSTOM
1.0000 | Freq: Three times a day (TID) | ORAL | Status: DC
  Administered 2018-11-16 – 2018-11-23 (×14): 1 via ORAL
  Filled 2018-11-16 (×22): qty 1

## 2018-11-16 MED ORDER — THIAMINE HCL 100 MG/ML IJ SOLN
100.0000 mg | Freq: Every day | INTRAMUSCULAR | Status: DC
  Filled 2018-11-16: qty 2

## 2018-11-16 MED ORDER — ENOXAPARIN SODIUM 40 MG/0.4ML ~~LOC~~ SOLN
40.0000 mg | Freq: Two times a day (BID) | SUBCUTANEOUS | Status: DC
  Administered 2018-11-16 – 2018-11-25 (×18): 40 mg via SUBCUTANEOUS
  Filled 2018-11-16 (×18): qty 0.4

## 2018-11-16 MED ORDER — SODIUM CHLORIDE 0.9% IV SOLUTION
Freq: Once | INTRAVENOUS | Status: AC
  Administered 2018-11-17: 02:00:00 via INTRAVENOUS

## 2018-11-16 MED ORDER — ACETAMINOPHEN 325 MG PO TABS
650.0000 mg | ORAL_TABLET | Freq: Four times a day (QID) | ORAL | Status: DC | PRN
  Filled 2018-11-16: qty 2

## 2018-11-16 MED ORDER — VITAMIN B-1 100 MG PO TABS
100.0000 mg | ORAL_TABLET | Freq: Every day | ORAL | Status: DC
  Administered 2018-11-16 – 2018-11-25 (×10): 100 mg via ORAL
  Filled 2018-11-16 (×10): qty 1

## 2018-11-16 MED ORDER — METHYLPREDNISOLONE SODIUM SUCC 125 MG IJ SOLR
60.0000 mg | Freq: Four times a day (QID) | INTRAMUSCULAR | Status: DC
  Administered 2018-11-16 – 2018-11-18 (×9): 60 mg via INTRAVENOUS
  Filled 2018-11-16 (×9): qty 2

## 2018-11-16 MED ORDER — ADULT MULTIVITAMIN W/MINERALS CH
1.0000 | ORAL_TABLET | Freq: Every day | ORAL | Status: DC
  Administered 2018-11-16 – 2018-11-25 (×10): 1 via ORAL
  Filled 2018-11-16 (×10): qty 1

## 2018-11-16 MED ORDER — POLYETHYLENE GLYCOL 3350 17 G PO PACK: 17.0000 g | PACK | Freq: Every day | ORAL | Status: DC | PRN

## 2018-11-16 MED ORDER — ENOXAPARIN SODIUM 40 MG/0.4ML ~~LOC~~ SOLN
40.0000 mg | SUBCUTANEOUS | Status: AC
  Administered 2018-11-16: 04:00:00 40 mg via SUBCUTANEOUS
  Filled 2018-11-16: qty 0.4

## 2018-11-16 NOTE — Progress Notes (Signed)
Patient admitted from [place] on 15lpm NRB with oxygen sats beween 92-95. Patient with no complaints of pain. Patient  A&Ox4. CHG bath given and head to toe assessment completed.

## 2018-11-16 NOTE — H&P (Signed)
History and Physical  Michael Ayers HRV:[id number] DOB: 07/18/1975 DOA: 11/15/2018  Referring physician: Dr. Lillia Carmel, ER physician  PCP: Patient, No Pcp Per  Outpatient Specialists: None Patient coming from: Home & is able to ambulate without assistance  Chief Complaint: Recurrent fever  HPI: Michael Ayers is a 43 y.o. male with alcohol use with secondary esophageal varices including episode of hematemesis requiring endoscopy and transfusion back in January with suspected secondary cirrhosis who presented to the emergency room on the evening of 5/11 with several days of persistent fever and shortness of breath.  Patient could not take it anymore so he went to go see the doctor today and was found to have hypoxia so he was sent over to the emergency room.  ED Course: In the emergency room in [place], patient was found to be hypoxic with an O2 sat of 76% on room air.  Initially was placed on nasal cannula which was then increased to high flow oxygen at 10 L.  Labs were checked and x-ray noted bilateral disease consistent with pneumonia.  Other labs noteworthy for normal white count with no predominance in neutrophils, INR of 1.3, close to baseline and acute kidney injury with a creatinine of 1.3.  Patient's AST mildly elevated at 92 with a normal ALT.  Of note, patient states he has not been drinking since January.  Also noted to have an LDH of 1300, CRP of 130 and procalcitonin level of 0.72.  Patient's oxygenation worsened and by the time he was transferred over to [facility] for [employer], he was requiring 15 L high flow nasal cannula.  He was placed in the ICU.  An ABG done on 15 L noted a PO2 of only 60.  Patient given IV Rocephin and Zithromax.  He was also given IV steroids  Review of Systems: Patient seen in the ICU at [facility]. Pt complains of feeling weak.  He does not feel short of breath with the high flow oxygen.  He denies any chest pain and  currently does not feel feverish.  He does complain of a mild nonproductive cough  Pt denies any headaches, vision changes, dysphagia, chest pain, palpitations, shortness of breath, wheeze, abdominal pain, hematuria, dysuria, constipation, diarrhea, focal extremity numbness weakness or pain, nausea or vomiting.  Review of systems are otherwise negative   Past Medical History:  Diagnosis Date  . Alcohol abuse   . Duodenitis   . Esophageal varices (HCC)   . Essential hypertension   . Headache    "sometimes daily" (07/19/2018)  . Hepatitis    "don't remember which one it was" (07/19/2018)  . Liver cirrhosis (HCC)   . Perforated peptic ulcer (HCC)    Past Surgical History:  Procedure Laterality Date  . ESOPHAGOGASTRODUODENOSCOPY (EGD) WITH PROPOFOL N/A 01/16/2018   Procedure: ESOPHAGOGASTRODUODENOSCOPY (EGD) WITH PROPOFOL;  Surgeon: Vida Rigger, MD;  Location: [facility] ENDOSCOPY;  Service: Endoscopy;  Laterality: N/A;  . ESOPHAGOGASTRODUODENOSCOPY (EGD) WITH PROPOFOL N/A 03/27/2018   Procedure: ESOPHAGOGASTRODUODENOSCOPY (EGD) WITH PROPOFOL;  Surgeon: Carman Ching, MD;  Location: [facility] ENDOSCOPY;  Service: Endoscopy;  Laterality: N/A;  . ESOPHAGOGASTRODUODENOSCOPY (EGD) WITH PROPOFOL N/A 07/21/2018   Procedure: ESOPHAGOGASTRODUODENOSCOPY (EGD) WITH PROPOFOL;  Surgeon: Charna Elizabeth, MD;  Location[facility] ENDOSCOPY;  Service: Endoscopy;  Laterality: N/A;  . INCISION AND DRAINAGE ABSCESS ANAL  04/17/2018  . IR ANGIOGRAM FOLLOW UP STUDY  04/09/2018  . IR ANGIOGRAM SELECTIVE EACH ADDITIONAL VESSEL  03/30/2018  . IR ANGIOGRAM SELECTIVE EACH ADDITIONAL VESSEL  03/30/2018  . IR ANGIOGRAM SELECTIVE EACH ADDITIONAL VESSEL  04/09/2018  . IR ANGIOGRAM SELECTIVE EACH ADDITIONAL VESSEL  04/09/2018  . IR ANGIOGRAM SELECTIVE EACH ADDITIONAL VESSEL  04/09/2018  . IR ANGIOGRAM SELECTIVE EACH ADDITIONAL VESSEL  04/09/2018  . IR EMBO ART  VEN HEMORR LYMPH EXTRAV  INC GUIDE ROADMAPPING  04/09/2018  . IR RADIOLOGIST EVAL & MGMT   05/20/2018  . IR TIPS  04/09/2018  . IR US GUIDE VASC ACCESS RIGHT  03/30/2018  . IR US GUIDE VASC ACCESS RIGHT  04/09/2018  . IR US GUIDE VASC ACCESS RIGHT  04/09/2018  . IR US GUIDE VASC ACCESS RIGHT  04/09/2018  . IR VENOGRAM ADRENAL UNI LEFT  03/30/2018  . IR VENOGRAM RENAL UNI LEFT  03/30/2018  . IR VENOGRAM RENAL UNI LEFT  04/09/2018  . RADIOLOGY WITH ANESTHESIA N/A 04/09/2018   Procedure: IR TIPS/BRTO;  Surgeon: Malachy Moan, MD;  [facility];  Service: Radiology;  Laterality: N/A;  . REPAIR OF PERFORATED ULCER  ~ 2015   peptic ulcer    Social History:  reports that he quit smoking about 16 years ago. His smoking use included cigarettes. He has a 3.30 pack-year smoking history. He has never used smokeless tobacco. He reports previous alcohol use of about 35.0 standard drinks of alcohol per week.  Patient states that he quit drinking back in January.  He reports that he does not use drugs.  Lives at home with his wife and children.  Able to ambulate without assistance   No Known Allergies  Family History  Problem Relation Age of Onset  . Diabetes Mellitus II Mother       Prior to Admission medications   Medication Sig Start Date End Date Taking? Authorizing Provider  cephALEXin (KEFLEX) 500 MG capsule Take 1 capsule (500 mg total) by mouth 4 (four) times daily. 08/14/18   Wynetta Fines, MD  chlordiazePOXIDE (LIBRIUM) 5 MG capsule  BIDx1day then  BIDx1day then  daily then stop 07/22/18   Glade Lloyd, MD  docusate sodium (COLACE) 250 MG capsule Take 1 capsule (250 mg total) by mouth daily. 08/14/18   Wynetta Fines, MD  folic acid (FOLVITE) 1 MG tablet Take 1 tablet (1 mg total) by mouth daily. 07/22/18   Glade Lloyd, MD  ibuprofen (ADVIL,MOTRIN) 600 MG tablet Take 1 tablet (600 mg total) by mouth every 6 (six) hours as needed. 08/14/18   Wynetta Fines, MD  Multiple Vitamin (MULTIVITAMIN WITH MINERALS) TABS tablet Take 1 tablet by mouth daily. 07/22/18   Glade Lloyd, MD  ondansetron (ZOFRAN) 4 MG tablet Take 1 tablet (4 mg total) by mouth every 6 (six) hours as needed for nausea. 07/22/18   Glade Lloyd, MD  pantoprazole (PROTONIX) 40 MG tablet Take 1 tablet (40 mg total) by mouth daily. 07/22/18   Glade Lloyd, MD  thiamine 100 MG tablet Take 1 tablet (100 mg total) by mouth daily. 07/22/18   Glade Lloyd, MD    Physical Exam: BP 118/65 (BP Location: Right Arm)   Pulse 73   Temp 99 F (37.2 C) (Oral)   Resp (!) 27   Ht  (1.702 m)   Wt 79.2 kg   SpO2 94%   BMI 27.35 kg/m   General: Alert and oriented x3, no acute distress Eyes: Sclera mildly icteric, extraocular movements are intact ENT: Normocephalic, atraumatic, mucous membranes are slightly dry Neck: Supple, no JVD Cardiovascular: Regular rate and rhythm, S1-S2 Respiratory: Decreased breath sounds  bibasilar Abdomen: Soft, nontender, nondistended, positive bowel sounds Skin: No skin breaks, tears or lesions.  Skin is jaundiced Musculoskeletal: No clubbing or cyanosis or edema Psychiatric: Appropriate, no evidence of psychoses Neurologic: No focal deficits          Labs on Admission:  Basic Metabolic Panel: No results for input(s): NA, K, CL, CO2, GLUCOSE, BUN, CREATININE, CALCIUM, MG, PHOS in the last 168 hours. Liver Function Tests: No results for input(s): AST, ALT, ALKPHOS, BILITOT, PROT, ALBUMIN in the last 168 hours. No results for input(s): LIPASE, AMYLASE in the last 168 hours. No results for input(s): AMMONIA in the last 168 hours. CBC: No results for input(s): WBC, NEUTROABS, HGB, HCT, MCV, PLT in the last 168 hours. Cardiac Enzymes: No results for input(s): CKTOTAL, CKMB, CKMBINDEX, TROPONINI in the last 168 hours.  BNP (last 3 results) No results for input(s): BNP in the last 8760 hours.  ProBNP (last 3 results) No results for input(s): PROBNP in the last 8760 hours.  CBG: No results for input(s): GLUCAP in the last 168 hours.  Radiological Exams  on Admission: No results found.  EKG: Not ordered  Assessment/Plan Present on Admission: . Acute respiratory failure with hypoxia (HCC) secondary to COVID-19 viral infection: Patient is critically ill and requiring significant amount of oxygen.  Despite 15 L high flow nasal cannula, PO2 is only at 60.  Placed patient in prone position, IV steroids.  Do not think that he is an Actmera candidate given his liver issues . Alcohol abuse: Patient states that he last drank back in January.  We will check an alcohol level regardless and place him on CIWA protocol.  Question history of hepatitis,Although in review of previous screening labs that he had done in July of last year, all test for hepatitis AB and C were negative . Esophageal varices (HCC), thrombocytopenia, elevated bilirubin and mildly elevated INR all consistent with signs of chronic cirrhosis.  Monitor closely, especially because he is on prophylactic dose of Lovenox, adjusted for his COVID status . Essential hypertension Acute kidney injury: Mild, IV fluids . Overweight (BMI 25.0-29.9): Meets criteria with BMI greater than 25  Principal Problem:   Acute respiratory failure with hypoxia (HCC) Active Problems:   Alcohol abuse   Esophageal varices (HCC)   Essential hypertension   Abnormal LFTs   Thrombocytopenia (HCC)   COVID-19 virus infection   Overweight (BMI 25.0-29.9)   DVT prophylaxis: Lovenox, prophylactic dose adjusted for COVID status  Code Status: Full code as confirmed by patient  Family Communication: Patient tells me that he will talk to his family  Disposition Plan: Critically ill, will certainly be here for several days, hopefully he will recover  Consults called: Discussed with critical care  Admission status: Patient meets criteria for acute inpatient, given his need to be here past 2 midnights requiring acute hospital services  I have spent 80 minutes in the care of this critically ill patient including  medical decision making, face-to-face examination, review of patient's old medical records and review of his current labs and films.    Hollice EspySendil K Aaryana Betke MD [facility] Pager [phone]  If 7PM-7AM, please contact night-coverage [email or web address] Password [email or web address]  11/16/2018, 2:11 AM

## 2018-11-16 NOTE — Progress Notes (Addendum)
[facility] PROGRESS NOTE    Progress Note  Michael Ayers  ZOX:[id number] DOB: 1976-02-07 DOA: 11/15/2018 PCP: Patient, No Pcp Per     Brief Narrative:   Michael Ayers is an 43 y.o. male past medical history of alcohol abuse, cirrhosis with portal gastropathy no varices on endoscopy and transfusion on 05/22/2019 to the ED on 11/15/2018 for several days of persistent fever and shortness of breath, went to see his PCP and was found to be hypoxic and when sent to the ED there he was found to be satting 76% on room air was placed on 10 L of nasal cannula and transfer to [facility] for management.  Procedures: 11/15/2018 chest x-ray showed diffuse bilateral infiltrates.  Medications: 11/15/2018: IV Solu-Medrol 11/16/2018: Doxycycline 11/16/2018 Actemra  Assessment/Plan:   Acute respiratory failure with hypoxia due to   COVID-19 virus infection Currently on 9 L through nonrebreather satting greater than 91% . His blood pressure is marginally stable map of 62 D-dimer is mildly elevated. Continue to trend inflammatory markers.  Wheezing physical exam continue IV Solu-Medrol. All the conversation with the patient have been in his native language in spanish which is my primary language. The treatment plan and use of medications (Actemra) and known side effects were discussed with patient/family, they were clearly explained that there is no proven definitive treatment for COVID-19 infection, any medications used here are based on published clinical articles/anecdotal data which are not peer-reviewed or randomized control trials.  Complete risks and long-term side effects are unknown, however in the best clinical judgment they seem to be of some clinical benefit rather than medical risks.  Patient/family agree with the treatment plan and want to receive the given medications.  COVID-19 Labs  Recent Labs    11/16/18 0535  DDIMER 2.12*  FERRITIN 89  CRP 12.0*     No results found for: SARSCOV2NAA   History of alcohol abuse: Patient relates that his last work-up was in January, check a GGT and hepatic function Started empirically on thiamine and folate. Currently on CIWA protocol which is less than 10.  Essential hypertension: We will hold antihypertensive medication. Acute kidney injury: With a baseline creatinine of less than 1, basic metabolic panel is pending today.  Skin Abscess in the anal area.: Start IV doxy.  DVT prophylaxis: lovenox Family Communication:none Disposition Plan/Barrier to D/C: transfer to progressive Code Status:     Code Status Orders  (From admission, onward)         Start     Ordered   11/16/18 0200  Full code  Continuous     11/16/18 0210        Code Status History    Date Active Date Inactive Code Status Order ID Comments User Context   07/19/2018 1942 07/22/2018 1351 Full Code [id number]  Haydee Monicaavid, Rachal A, MD Inpatient   03/27/2018 0238 03/30/2018 2106 Full Code [id number]  Lorretta HarpNiu, Xilin, MD ED   01/14/2018 2238 01/16/2018 1755 Full Code [id number]  Lorretta HarpNiu, Xilin, MD Inpatient        IV Access:    Peripheral IV   Procedures and diagnostic studies:   No results found.   Medical Consultants:    None.  Anti-Infectives:   Doxycycline started on 11/16/2018  Subjective:    Michael Ayers he relates his breathing is unchanged compared to yesterday.  Objective:    Vitals:   11/16/18 0700 11/16/18 0745 11/16/18 0758 11/16/18 0800  BP: (!) 99/53 (!) 112/57 (!) 112/57 (!) 97/47  Pulse: (!) 53 63 68 66  Resp: 17 20 20 15   Temp:      TempSrc:      SpO2: 99% 94% 92% 91%  Weight:      Height:        Intake/Output Summary (Last 24 hours) at 11/16/2018 0830 Last data filed at 11/16/2018 0700 Gross per 24 hour  Intake 438.47 ml  Output 275 ml  Net 163.47 ml   Filed Weights   11/16/18 0133  Weight: 79.2 kg    Exam: General exam: In no acute distress. Respiratory system: Good air  movement and wheezing bilaterally. Cardiovascular system: Rate and rhythm with no murmurs rubs gallops. Gastrointestinal system: Positive bowel sounds soft nontender nondistended Central nervous system: Wake alert and oriented x3 nonfocal. Extremities: No lower extremity edema. Skin: No rashes Psychiatry: Judgment and insight appear normal.   Data Reviewed:    Labs: Basic Metabolic Panel: Recent Labs  Lab 11/16/18 0238  NA 138  K 4.3   GFR CrCl cannot be calculated (Patient's most recent lab result is older than the maximum 21 days allowed.). Liver Function Tests: No results for input(s): AST, ALT, ALKPHOS, BILITOT, PROT, ALBUMIN in the last 168 hours. No results for input(s): LIPASE, AMYLASE in the last 168 hours. No results for input(s): AMMONIA in the last 168 hours. Coagulation profile No results for input(s): INR, PROTIME in the last 168 hours. COVID-19 Labs  Recent Labs    11/16/18 0535  DDIMER 2.12*    No results found for: SARSCOV2NAA  CBC: Recent Labs  Lab 11/16/18 0238  HGB 11.2*  HCT 33.0*   Cardiac Enzymes: No results for input(s): CKTOTAL, CKMB, CKMBINDEX, TROPONINI in the last 168 hours. BNP (last 3 results) No results for input(s): PROBNP in the last 8760 hours. CBG: No results for input(s): GLUCAP in the last 168 hours. D-Dimer: Recent Labs    11/16/18 0535  DDIMER 2.12*   Hgb A1c: No results for input(s): HGBA1C in the last 72 hours. Lipid Profile: No results for input(s): CHOL, HDL, LDLCALC, TRIG, CHOLHDL, LDLDIRECT in the last 72 hours. Thyroid function studies: No results for input(s): TSH, T4TOTAL, T3FREE, THYROIDAB in the last 72 hours.  Invalid input(s): FREET3 Anemia work up: No results for input(s): VITAMINB12, FOLATE, FERRITIN, TIBC, IRON, RETICCTPCT in the last 72 hours. Sepsis Labs: No results for input(s): PROCALCITON, WBC, LATICACIDVEN in the last 168 hours. Microbiology No results found for this or any previous visit  (from the past 240 hour(s)).   Medications:   . enoxaparin (LOVENOX) injection  40 mg Subcutaneous Q12H  . folic acid  1 mg Oral Daily  . methylPREDNISolone (SOLU-MEDROL) injection  60 mg Intravenous Q6H  . multivitamin with minerals  1 tablet Oral Daily  . thiamine  100 mg Oral Daily   Or  . thiamine  100 mg Intravenous Daily   Continuous Infusions: . sodium chloride 100 mL/hr at 11/16/18 0700      LOS: 1 day   Marinda Elk  [facility]  11/16/2018, 8:30 AM

## 2018-11-16 NOTE — Research (Signed)
IMarinda Elk, MD, consented Subject Michael Ayers (male, Date of Birth May 25, 1976, 43 y.o.) and with diagnosis of COVID-19, in the [facility] Expanded Access Program (EAP) Research Protocol for [employer] against COVID-19.  The consent took place under following circumstances.   Subject Capacity assessed by this investigator as:  Presence of adequate emotional and mental capacity to consent with normal ability to read and write.  Consent took place in the following setting(s):  In-room, face to face   The following were present for the consent process:  Investigator   A copy of the cover letter and signed consent document was provided to subject/LAR.  The original signed consent document has been placed in the subject's physical chart and will be scanned into the electronic medical record upon discharge.  Statement of acknowledgement that the following was discussed with the subject/LAR:    1) Discussed the purpose of the research and procedures  2) Discussed risks and benefits and uncertainties of study participation 3) Discussed subject's responsibilities  4) Discussed the measures in place to maintain subject's confidentiality while a participant on the trial  5) Discussed alternatives to study participation.   6) Discussed study participation is voluntary and that the subject's care would not be jeopardized if they declined participation in the study.   7) Discussed freedom to withdraw at any time.   8) All subject/LAR questions were answered to their satisfaction.   9) In case of emergency consent, investigator agreed to discuss with subject/LAR at earliest available opportunity when the subject stabilizes and/or LAR can be located.     Final Investigator Signature  Marinda Elk, [place] [phone]   Date: 11/16/2018 and 6:07 PM

## 2018-11-16 NOTE — Progress Notes (Signed)
Patient complains of abscess to right buttocks. States that he has had this problem before and had it drained. Warm compress applied. No drainage noted.

## 2018-11-16 NOTE — Plan of Care (Signed)

## 2018-11-16 NOTE — Progress Notes (Addendum)
Initial Nutrition Assessment RD working remotely.  DOCUMENTATION CODES:   Not applicable  INTERVENTION:    Boost Breeze po TID, each supplement provides 250 kcal and 9 grams of protein   Continue multivitamin daily  NUTRITION DIAGNOSIS:   Increased nutrient needs related to acute illness(COVID-19) as evidenced by estimated needs.  GOAL:   Patient will meet greater than or equal to 90% of their needs  MONITOR:   PO intake, Supplement acceptance, Labs, I & O's  REASON FOR ASSESSMENT:   Malnutrition Screening Tool    ASSESSMENT:   43 yo male with PMH of alcohol abuse, esophageal varices, hepatitis, cirrhosis, and HTN who was admitted with COVID-19.   On admission malnutrition screening tool, patient reported recent poor intake due to decreased appetite and some weight loss. Per review of weight encounters, patient has not lost any weight.  Currently on a regular diet; suspect intake will be suboptimal due to acute illness.   Labs reviewed. Potassium 5.2 (H)  Medications reviewed and include folic acid, solu-medrol, MVI, thiamine.   NUTRITION - FOCUSED PHYSICAL EXAM:  unable to complete--working remotely  Diet Order:   Diet Order            Diet regular Room service appropriate? Yes; Fluid consistency: Thin  Diet effective now              EDUCATION NEEDS:   No education needs have been identified at this time  Skin:  Skin Assessment: Reviewed RN Assessment  Last BM:  5/10  Height:   Ht Readings from Last 1 Encounters:  11/16/18 5\' 7"  (1.702 m)    Weight:   Wt Readings from Last 1 Encounters:  11/16/18 79.2 kg    Ideal Body Weight:  67.3 kg  BMI:  Body mass index is 27.35 kg/m.  Estimated Nutritional Needs:   Kcal:  2100-2300  Protein:  100-115 gm  Fluid:  2.2 L    Joaquin Courts, RD, LDN, CNSC Pager [phone] After Hours Pager [phone]

## 2018-11-17 ENCOUNTER — Inpatient Hospital Stay (HOSPITAL_COMMUNITY): Payer: HRSA Program

## 2018-11-17 LAB — C-REACTIVE PROTEIN: CRP: 9.4 mg/dL — ABNORMAL HIGH (ref <1.0)

## 2018-11-17 LAB — TYPE AND SCREEN
ABO/RH(D): AB POS
Antibody Screen: NEGATIVE

## 2018-11-17 LAB — FERRITIN: Ferritin: 63 ng/mL (ref 24–336)

## 2018-11-17 LAB — INTERLEUKIN-6, PLASMA: Interleukin-6, Plasma: 196.5 pg/mL — ABNORMAL HIGH (ref 0.0–12.2)

## 2018-11-17 LAB — D-DIMER, QUANTITATIVE: D-Dimer, Quant: 1.92 ug/mL-FEU — ABNORMAL HIGH (ref 0.00–0.50)

## 2018-11-17 MED ORDER — ACETAMINOPHEN 500 MG PO TABS: 500.0000 mg | ORAL_TABLET | Freq: Four times a day (QID) | ORAL | Status: DC | PRN

## 2018-11-17 NOTE — TOC Initial Note (Signed)
Transition of Care [facility]) - Initial/Assessment Note    Patient Details  Name: Michael Ayers MRN: [id number] Date of Birth: [birth year]  [facility] CM/SW Contact:    Durenda Guthrie, RN Phone Number: [phone] 11/17/2018, 11:45 AM  Clinical Narrative:                 43 yr old gentleman admitted for COVID 19 treatment. Case manager spoke with patient via Education officer, community. Patient lives home with his wife, and will have support at discharge. Patient agrees to being setup with Tele visit after discharge.Appointment is scheduled for Wednesday,May 20,2020 @3 :30pm. CM will continue to follow should appointment need to be changed and for any discharge needs.   Expected Discharge Plan: Home/Self Care Barriers to Discharge: No Barriers Identified   Patient Goals and CMS Choice        Expected Discharge Plan and Services Expected Discharge Plan: Home/Self Care   Discharge Planning Services: CM Consult, Follow-up appt scheduled   Living arrangements for the past 2 months: Single Family Home                 DME Arranged: N/A DME Agency: NA                  Prior Living Arrangements/Services Living arrangements for the past 2 months: Single Family Home Lives with:: Spouse Patient language and need for interpreter reviewed:: Yes(CM used Education officer, community services - Spanish) Do you feel safe going back to the place where you live?: Yes      Need for Family Participation in Patient Care: No (Comment) Care giver support system in place?: Yes (comment)      Activities of Daily Living Home Assistive Devices/Equipment: None ADL Screening (condition at time of admission) Patient's cognitive ability adequate to safely complete daily activities?: Yes Is the patient deaf or have difficulty hearing?: No Does the patient have difficulty seeing, even when wearing glasses/contacts?: No Does the patient have difficulty concentrating, remembering, or making  decisions?: No Patient able to express need for assistance with ADLs?: No Does the patient have difficulty dressing or bathing?: No Independently performs ADLs?: Yes (appropriate for developmental age) Does the patient have difficulty walking or climbing stairs?: No Weakness of Legs: None Weakness of Arms/Hands: None  Permission Sought/Granted Permission sought to share information with : Case Manager                Emotional Assessment       Orientation: : Oriented to Self, Oriented to Situation, Oriented to Place, Oriented to  Time      Admission diagnosis:  COVID-19 [U07.1, J98.8] Patient Active Problem List   Diagnosis Date Noted  . Acute respiratory failure with hypoxia (HCC) 11/16/2018  . COVID-19 virus infection 11/16/2018  . Overweight (BMI 25.0-29.9) 11/16/2018  . AKI (acute kidney injury) (HCC) 11/16/2018  . GIB (gastrointestinal bleeding) 07/19/2018  . Perforated peptic ulcer (HCC)   . Anemia 03/29/2018  . Abdominal pain 03/27/2018  . Abnormal LFTs 01/15/2018  . Thrombocytopenia (HCC) 01/15/2018  . Hematemesis 01/14/2018  . Alcohol abuse   . Esophageal varices (HCC)   . Duodenitis   . Essential hypertension    PCP:  Patient, No Pcp Per Pharmacy:   [facility] [place], [place] - [address] [place] [address] Phone: [phone] Fax: [phone]     Social Determinants of Health (SDOH) Interventions    Readmission Risk Interventions No flowsheet data found.

## 2018-11-17 NOTE — Progress Notes (Signed)
Michael Ayers TEAM 1 - Stepdown/ICU TEAM  Michael Ayers  KWI:[id number] DOB: 12-May-1976 DOA: 11/15/2018 PCP: Patient, No Pcp Per    Brief Narrative:  42yo w/ a hx of alcohol abuse, cirrhosis, and portal gastropathy but w/ no varices on endoscopy 07/21/18 who presented to the ED on 5/11 with several days of persistent fever and shortness of breath after he went to see his PCP and was found to be hypoxic. In the ED he was found to have sats of 76% on room air and was placed on 10 L [place] and transfered to [facility].  Significant Events: 5/12 admit to [facility] from [facility] ED   COVID-19 specific Treatment: Actemra 5/12 Convalescent plasma 5/13 03:52  Subjective: This morning the patient is having persisting and acutely worsening hypoxia.  At the time of my exam he is eating breakfast and actually looks quite comfortable but is desaturating into the 70s despite high level oxygen support.  He is in no other distress otherwise.  He agreed to proning as soon as he finishes his meal.  He denies chest pain nausea vomiting abdominal pain or diarrhea.  Assessment & Plan:  Acute hypoxic respiratory failure - COVID-19 pneumonitis  Has required escalation to 15L HFNC support this morning - tolerated convalescent plasma w/o difficulty - f/u CXR pending - remains at risk for decline and need to escalate support   Recent Labs    11/16/18 0535 11/17/18 0500  DDIMER 2.12* 1.92*  FERRITIN 89 63  CRP 12.0* 9.4*    Alcohol abuse - Cirrhosis of Liver  Continue thiamine and folate - is reportedly non-compliant w/ outpt f/u - cont CIWA w/ risk for EtOH withdrawal   Thrombocytopenia Due to EtOH abuse / cirrhosis - follow   HTN BP stable at present   Skin Abscess of anal area Cont IV doxy - now draining well per RN   DVT prophylaxis: lovenox  Code Status: FULL CODE Family Communication:   Disposition Plan: SDU - risk for ICU transfer   Consultants:  none  Antimicrobials:  Doxycycline 5/12 >   Objective: Blood pressure (!) 89/47, pulse 73, temperature 98.2 F (36.8 C), temperature source Oral, resp. rate (!) 23, height 5\' 7"  (1.702 m), weight 79.2 kg, SpO2 90 %.  Intake/Output Summary (Last 24 hours) at 11/17/2018 0937 Last data filed at 11/17/2018 0555 Gross per 24 hour  Intake 1836.91 ml  Output 675 ml  Net 1161.91 ml   Filed Weights   11/16/18 0133  Weight: 79.2 kg    Examination: General: No acute respiratory distress evident despite desaturation Lungs: course bibasilar crackles - no wheezing  Cardiovascular: Regular rate and rhythm without murmur gallop or rub normal S1 and S2 Abdomen: Nontender, nondistended, soft, bowel sounds positive, no rebound, no ascites, no appreciable mass Extremities: No significant cyanosis, clubbing, or edema bilateral lower extremities  CBC: Recent Labs  Lab 11/16/18 0238 11/16/18 0535  WBC  --  4.0  NEUTROABS  --  3.0  HGB 11.2* 12.7*  HCT 33.0* 38.0*  MCV  --  88.6  PLT  --  73*   Basic Metabolic Panel: Recent Labs  Lab 11/16/18 0238 11/16/18 0535  NA 138 137  K 4.3 5.2*  CL  --  106  CO2  --  23  GLUCOSE  --  85  BUN  --  14  CREATININE  --  1.10  CALCIUM  --  8.0*   GFR: Estimated Creatinine Clearance: 81 mL/min (by C-G formula based on  SCr of 1.1 mg/dL).  Liver Function Tests: Recent Labs  Lab 11/16/18 0535  AST 70*  70*  ALT 23  23  ALKPHOS 116  117  BILITOT 3.1*  2.9*  PROT 6.5  6.4*  ALBUMIN 2.5*  2.4*   No results for input(s): LIPASE, AMYLASE in the last 168 hours. No results for input(s): AMMONIA in the last 168 hours.  Coagulation Profile: No results for input(s): INR, PROTIME in the last 168 hours.   Recent Results (from the past 240 hour(s))  MRSA PCR Screening     Status: None   Collection Time: 11/16/18  9:46 AM  Result Value Ref Range Status   MRSA by PCR NEGATIVE NEGATIVE Final    Comment:        The GeneXpert MRSA Assay (FDA approved for NASAL specimens only), is one  component of a comprehensive MRSA colonization surveillance program. It is not intended to diagnose MRSA infection nor to guide or monitor treatment for MRSA infections. Performed at [facility], [address] [place], [place] [address]      Scheduled Meds: . enoxaparin (LOVENOX) injection  40 mg Subcutaneous Q12H  . feeding supplement  1 Container Oral TID BM  . folic acid  1 mg Oral Daily  . methylPREDNISolone (SOLU-MEDROL) injection  60 mg Intravenous Q6H  . multivitamin with minerals  1 tablet Oral Daily  . pantoprazole  40 mg Oral BID  . thiamine  100 mg Oral Daily   Or  . thiamine  100 mg Intravenous Daily     LOS: 2 days   Lonia BloodJeffrey T. Demaris Bousquet, MD [facility] Office  [phone] Pager - Text Page per Loretha StaplerAmion  If 7PM-7AM, please contact night-coverage per Amion 11/17/2018, 9:37 AM

## 2018-11-18 ENCOUNTER — Inpatient Hospital Stay (HOSPITAL_COMMUNITY): Payer: HRSA Program

## 2018-11-18 LAB — COMPREHENSIVE METABOLIC PANEL
ALT: 33 U/L (ref 0–44)
AST: 79 U/L — ABNORMAL HIGH (ref 15–41)
Albumin: 2.2 g/dL — ABNORMAL LOW (ref 3.5–5.0)
Alkaline Phosphatase: 100 U/L (ref 38–126)
Anion gap: 5 (ref 5–15)
BUN: 17 mg/dL (ref 6–20)
CO2: 23 mmol/L (ref 22–32)
Calcium: 7.9 mg/dL — ABNORMAL LOW (ref 8.9–10.3)
Chloride: 112 mmol/L — ABNORMAL HIGH (ref 98–111)
Creatinine, Ser: 0.81 mg/dL (ref 0.61–1.24)
GFR calc Af Amer: >60 mL/min (ref >60)
GFR calc non Af Amer: >60 mL/min (ref >60)
Glucose, Bld: 152 mg/dL — ABNORMAL HIGH (ref 70–99)
Potassium: 3.9 mmol/L (ref 3.5–5.1)
Sodium: 140 mmol/L (ref 135–145)
Total Bilirubin: 1.7 mg/dL — ABNORMAL HIGH (ref 0.3–1.2)
Total Protein: 5.8 g/dL — ABNORMAL LOW (ref 6.5–8.1)

## 2018-11-18 LAB — CBC WITH DIFFERENTIAL/PLATELET
Abs Immature Granulocytes: 0.09 10*3/uL — ABNORMAL HIGH (ref 0.00–0.07)
Basophils Absolute: 0 10*3/uL (ref 0.0–0.1)
Basophils Relative: 0 %
Eosinophils Absolute: 0 10*3/uL (ref 0.0–0.5)
Eosinophils Relative: 0 %
HCT: 33.2 % — ABNORMAL LOW (ref 39.0–52.0)
Hemoglobin: 11.2 g/dL — ABNORMAL LOW (ref 13.0–17.0)
Immature Granulocytes: 2 %
Lymphocytes Relative: 10 %
Lymphs Abs: 0.6 10*3/uL — ABNORMAL LOW (ref 0.7–4.0)
MCH: 29.6 pg (ref 26.0–34.0)
MCHC: 33.7 g/dL (ref 30.0–36.0)
MCV: 87.8 fL (ref 80.0–100.0)
Monocytes Absolute: 0.3 10*3/uL (ref 0.1–1.0)
Monocytes Relative: 5 %
Neutro Abs: 4.7 10*3/uL (ref 1.7–7.7)
Neutrophils Relative %: 83 %
Platelets: 93 10*3/uL — ABNORMAL LOW (ref 150–400)
RBC: 3.78 MIL/uL — ABNORMAL LOW (ref 4.22–5.81)
RDW: 15.1 % (ref 11.5–15.5)
WBC: 5.6 10*3/uL (ref 4.0–10.5)
nRBC: 0 % (ref 0.0–0.2)

## 2018-11-18 LAB — PREPARE FRESH FROZEN PLASMA

## 2018-11-18 LAB — BPAM FFP
Blood Product Expiration Date: 202005140020
ISSUE DATE / TIME: 202005130044
Unit Type and Rh: 8400

## 2018-11-18 LAB — MAGNESIUM: Magnesium: 1.9 mg/dL (ref 1.7–2.4)

## 2018-11-18 LAB — D-DIMER, QUANTITATIVE: D-Dimer, Quant: 2.76 ug/mL-FEU — ABNORMAL HIGH (ref 0.00–0.50)

## 2018-11-18 LAB — APTT: aPTT: 30 seconds (ref 24–36)

## 2018-11-18 LAB — PROTIME-INR
INR: 1.5 — ABNORMAL HIGH (ref 0.8–1.2)
Prothrombin Time: 18.2 seconds — ABNORMAL HIGH (ref 11.4–15.2)

## 2018-11-18 LAB — FERRITIN: Ferritin: 50 ng/mL (ref 24–336)

## 2018-11-18 MED ORDER — METHYLPREDNISOLONE SODIUM SUCC 125 MG IJ SOLR
40.0000 mg | Freq: Three times a day (TID) | INTRAMUSCULAR | Status: AC
  Administered 2018-11-18 (×2): 40 mg via INTRAVENOUS
  Filled 2018-11-18 (×3): qty 2

## 2018-11-18 NOTE — Progress Notes (Signed)
[place] TEAM 1 - Stepdown/ICU TEAM  Michael FamManuel Ayers  ZOX:[id number] DOB: 1975-11-02 DOA: 11/15/2018 PCP: Patient, No Pcp Per    Brief Narrative:  42yo w/ a hx of alcohol abuse, cirrhosis, and portal gastropathy but w/ no varices on endoscopy 07/21/18 who presented to the ED on 5/11 with several days of persistent fever and shortness of breath after he went to see his PCP and was found to be hypoxic. In the ED he was found to have sats of 76% on room air and [facility].  Significant Events: [facility]   COVID-19 specific Treatment: Actemra 5/12 Convalescent plasma 5/13 03:52 Steroids 5/12 > 5/14  Subjective: Has been able to hold his on with [place] only over night. Vitals are otherwise stable. Serum markers are favorable overall. CXR remains impressive but w/o significant change.  He is able to communicate in [place] and tells me he feels better today.  He denies chest pain nausea vomiting or abdominal pain.  He denies any further drainage from the abscess on his buttock.  Assessment & Plan:  Acute hypoxic respiratory failure - COVID-19 pneumonitis  tolerated convalescent plasma w/o difficulty -clinically stabilizing/improving -no indication for further treatment with convalescent plasma at this time -today is his last day of steroid treatment  Recent Labs    11/16/18 0535 11/17/18 0500 11/18/18 0545  DDIMER 2.12* 1.92* 2.76*  FERRITIN 89 63 50  CRP 12.0* 9.4*  --     Alcohol abuse - Cirrhosis of Liver  Continue thiamine and folate - is reportedly non-compliant w/ outpt f/u - cont CIWA w/ risk for EtOH withdrawal -no evidence of withdrawal at this time  Thrombocytopenia Due to EtOH abuse / cirrhosis - follow   HTN BP stable   Skin Abscess of anal area Cont IV doxy - no evidence of complication at this time  DVT prophylaxis: lovenox  Code Status: FULL CODE Family Communication:   Disposition Plan: SDU - risk for ICU  transfer persists  Consultants:  none  Antimicrobials:  Doxycycline 5/12 >  Objective: Blood pressure 123/76, pulse 67, temperature 97.7 F (36.5 C), temperature source Oral, resp. rate (!) 23, height 5\' 7"  (1.702 m), weight 79.2 kg, SpO2 91 %.  Intake/Output Summary (Last 24 hours) at 11/18/2018 0912 Last data filed at 11/18/2018 0600 Gross per 24 hour  Intake 1580 ml  Output 1225 ml  Net 355 ml   Filed Weights   11/16/18 0133  Weight: 79.2 kg    Examination: General: Appears comfortable despite high oxygen demand Lungs: Fine crackles diffusely with no wheezing Cardiovascular: RRR without murmur gallop or rub Abdomen: NT/ND, soft, bowel sounds positive, no rebound Extremities: No C/C/E bilateral lower extremities  CBC: Recent Labs  Lab 11/16/18 0238 11/16/18 0535 11/18/18 0545  WBC  --  4.0 5.6  NEUTROABS  --  3.0 4.7  HGB 11.2* 12.7* 11.2*  HCT 33.0* 38.0* 33.2*  MCV  --  88.6 87.8  PLT  --  73* 93*   Basic Metabolic Panel: Recent Labs  Lab 11/16/18 0238 11/16/18 0535 11/18/18 0545  NA 138 137 140  K 4.3 5.2* 3.9  CL  --  106 112*  CO2  --  23 23  GLUCOSE  --  85 152*  BUN  --  14 17  CREATININE  --  1.10 0.81  CALCIUM  --  8.0* 7.9*  MG  --   --  1.9   GFR: Estimated Creatinine  Clearance: 109.9 mL/min (by C-G formula based on SCr of 0.81 mg/dL).  Liver Function Tests: Recent Labs  Lab 11/16/18 0535 11/18/18 0545  AST 70*  70* 79*  ALT 23  23 33  ALKPHOS 116  117 100  BILITOT 3.1*  2.9* 1.7*  PROT 6.5  6.4* 5.8*  ALBUMIN 2.5*  2.4* 2.2*    Coagulation Profile: Recent Labs  Lab 11/18/18 0545  INR 1.5*     Recent Results (from the past 240 hour(s))  MRSA PCR Screening     Status: None   Collection Time: 11/16/18  9:46 AM  Result Value Ref Range Status   MRSA by PCR NEGATIVE NEGATIVE Final    Comment:        The GeneXpert MRSA Assay (FDA approved for NASAL specimens only), is one component of a comprehensive MRSA  colonization surveillance program. It is not intended to diagnose MRSA infection nor to guide or monitor treatment for MRSA infections. Performed at [facility], [address] [place], [place] [address]      Scheduled Meds: . enoxaparin (LOVENOX) injection  40 mg Subcutaneous Q12H  . feeding supplement  1 Container Oral TID BM  . folic acid  1 mg Oral Daily  . methylPREDNISolone (SOLU-MEDROL) injection  60 mg Intravenous Q6H  . multivitamin with minerals  1 tablet Oral Daily  . pantoprazole  40 mg Oral BID  . thiamine  100 mg Oral Daily   Or  . thiamine  100 mg Intravenous Daily     LOS: 3 days   Lonia Blood, MD [facility] Office  [phone] Pager - Text Page per Loretha Stapler  If 7PM-7AM, please contact night-coverage per Amion 11/18/2018, 9:12 AM

## 2018-11-18 NOTE — Progress Notes (Signed)
Updated patients daughter on patients condition. No concerns noted.  Gave daughter number to nurses station.

## 2018-11-18 NOTE — Progress Notes (Signed)
CardioVascular Research Department and AHF Team  ReDS Research Project   Patient #: [id number]  ReDS Measurement  Right: low quality x 3  Left: low quality x 3

## 2018-11-19 LAB — COMPREHENSIVE METABOLIC PANEL
ALT: 54 U/L — ABNORMAL HIGH (ref 0–44)
AST: 91 U/L — ABNORMAL HIGH (ref 15–41)
Albumin: 2.2 g/dL — ABNORMAL LOW (ref 3.5–5.0)
Alkaline Phosphatase: 91 U/L (ref 38–126)
Anion gap: 5 (ref 5–15)
BUN: 16 mg/dL (ref 6–20)
CO2: 23 mmol/L (ref 22–32)
Calcium: 7.7 mg/dL — ABNORMAL LOW (ref 8.9–10.3)
Chloride: 111 mmol/L (ref 98–111)
Creatinine, Ser: 0.71 mg/dL (ref 0.61–1.24)
GFR calc Af Amer: >60 mL/min (ref >60)
GFR calc non Af Amer: >60 mL/min (ref >60)
Glucose, Bld: 141 mg/dL — ABNORMAL HIGH (ref 70–99)
Potassium: 4 mmol/L (ref 3.5–5.1)
Sodium: 139 mmol/L (ref 135–145)
Total Bilirubin: 1.7 mg/dL — ABNORMAL HIGH (ref 0.3–1.2)
Total Protein: 5.5 g/dL — ABNORMAL LOW (ref 6.5–8.1)

## 2018-11-19 LAB — CBC WITH DIFFERENTIAL/PLATELET
Abs Immature Granulocytes: 0.12 10*3/uL — ABNORMAL HIGH (ref 0.00–0.07)
Basophils Absolute: 0 10*3/uL (ref 0.0–0.1)
Basophils Relative: 0 %
Eosinophils Absolute: 0 10*3/uL (ref 0.0–0.5)
Eosinophils Relative: 0 %
HCT: 32.5 % — ABNORMAL LOW (ref 39.0–52.0)
Hemoglobin: 11.2 g/dL — ABNORMAL LOW (ref 13.0–17.0)
Immature Granulocytes: 3 %
Lymphocytes Relative: 9 %
Lymphs Abs: 0.4 10*3/uL — ABNORMAL LOW (ref 0.7–4.0)
MCH: 29.8 pg (ref 26.0–34.0)
MCHC: 34.5 g/dL (ref 30.0–36.0)
MCV: 86.4 fL (ref 80.0–100.0)
Monocytes Absolute: 0.3 10*3/uL (ref 0.1–1.0)
Monocytes Relative: 7 %
Neutro Abs: 3.8 10*3/uL (ref 1.7–7.7)
Neutrophils Relative %: 81 %
Platelets: 87 10*3/uL — ABNORMAL LOW (ref 150–400)
RBC: 3.76 MIL/uL — ABNORMAL LOW (ref 4.22–5.81)
RDW: 14.7 % (ref 11.5–15.5)
WBC: 4.7 10*3/uL (ref 4.0–10.5)
nRBC: 0 % (ref 0.0–0.2)

## 2018-11-19 LAB — D-DIMER, QUANTITATIVE: D-Dimer, Quant: 2.73 ug/mL-FEU — ABNORMAL HIGH (ref 0.00–0.50)

## 2018-11-19 LAB — FERRITIN: Ferritin: 48 ng/mL (ref 24–336)

## 2018-11-19 NOTE — Progress Notes (Signed)
[place] TEAM 1 - Stepdown/ICU TEAM  Akiem Joynt  NUU:[id number] DOB: 1975-12-13 DOA: 11/15/2018 PCP: Patient, No Pcp Per    Brief Narrative:  43yo w/ a hx of alcohol abuse, cirrhosis, and portal gastropathy but w/ no varices on endoscopy 07/21/18 who presented to the ED on 5/11 with several days of persistent fever and shortness of breath after he went to see his PCP and was found to be hypoxic. In the ED he was found to have sats of 76% on room air and was transfered to [facility]  Significant Events: 5/12 admit to [facility] from [facility] ED   COVID-19 specific Treatment: Actemra 5/12 Convalescent plasma 5/13 03:52 Steroids 5/12 > 5/14  Subjective: Continues to require high flow nasal cannula support but with good results. Peri-rectal abscess continues to drain dark brown reddish fluid w/ no evidence of cellulitis spreading beyond local wound. Denies cp, n/v, or abdom pain. Reports only minimal sob. Is comfortable in the prone position.   Assessment & Plan:  Acute hypoxic respiratory failure - COVID-19 pneumonitis  tolerated convalescent plasma w/o difficulty -clinically stabilizing/improving -no indication for further treatment with convalescent plasma at this time - clinically stable though still requiring signif O2 support   Recent Labs    11/17/18 0500 11/18/18 0545 11/19/18 0607  DDIMER 1.92* 2.76* 2.73*  FERRITIN 63 50 48  CRP 9.4*  --   --     Alcohol abuse - Cirrhosis of Liver  Continue thiamine and folate - is reportedly non-compliant w/ outpt f/u - cont CIWA w/ risk for EtOH withdrawal -no withdrawal at this time  Thrombocytopenia Due to EtOH abuse / cirrhosis - stable   HTN BP stable   Skin Abscess of anal area Cont IV doxy - no evidence of complication at this time - continues to drain - may require I&D once stable to leave hospital   DVT prophylaxis: lovenox  Code Status: FULL CODE Family Communication:   Disposition Plan: SDU - risk for ICU  transfer persists  Consultants:  none  Antimicrobials:  Doxycycline 5/12 >  Objective: Blood pressure 126/74, pulse 73, temperature 97.7 F (36.5 C), resp. rate 19, height 5\' 7"  (1.702 m), weight 79.2 kg, SpO2 93 %.  Intake/Output Summary (Last 24 hours) at 11/19/2018 1411 Last data filed at 11/19/2018 1055 Gross per 24 hour  Intake 1676.18 ml  Output 1100 ml  Net 576.18 ml   Filed Weights   11/16/18 0133  Weight: 79.2 kg    Examination: General: comfortable despite high oxygen demand Lungs: Fine crackles diffusely - no wheezing  Cardiovascular: RRR w/o M  Abdomen: NT/ND, soft, bowel sounds positive Extremities: No C/C/E B LE   CBC: Recent Labs  Lab 11/16/18 0535 11/18/18 0545 11/19/18 0607  WBC 4.0 5.6 4.7  NEUTROABS 3.0 4.7 3.8  HGB 12.7* 11.2* 11.2*  HCT 38.0* 33.2* 32.5*  MCV 88.6 87.8 86.4  PLT 73* 93* 87*   Basic Metabolic Panel: Recent Labs  Lab 11/16/18 0535 11/18/18 0545 11/19/18 0607  NA 137 140 139  K 5.2* 3.9 4.0  CL 106 112* 111  CO2 23 23 23   GLUCOSE 85 152* 141*  BUN 14 17 16   CREATININE 1.10 0.81 0.71  CALCIUM 8.0* 7.9* 7.7*  MG  --  1.9  --    GFR: Estimated Creatinine Clearance: 111.3 mL/min (by C-G formula based on SCr of 0.71 mg/dL).  Liver Function Tests: Recent Labs  Lab 11/16/18 0535 11/18/18 0545 11/19/18 0607  AST 70*  70* 79* 91*  ALT 23  23 33 54*  ALKPHOS 116  117 100 91  BILITOT 3.1*  2.9* 1.7* 1.7*  PROT 6.5  6.4* 5.8* 5.5*  ALBUMIN 2.5*  2.4* 2.2* 2.2*    Coagulation Profile: Recent Labs  Lab 11/18/18 0545  INR 1.5*     Recent Results (from the past 240 hour(s))  MRSA PCR Screening     Status: None   Collection Time: 11/16/18  9:46 AM  Result Value Ref Range Status   MRSA by PCR NEGATIVE NEGATIVE Final    Comment:        The GeneXpert MRSA Assay (FDA approved for NASAL specimens only), is one component of a comprehensive MRSA colonization surveillance program. It is not intended to  diagnose MRSA infection nor to guide or monitor treatment for MRSA infections. Performed at [facility], [address] [place], [place] [address]      Scheduled Meds: . enoxaparin (LOVENOX) injection  40 mg Subcutaneous Q12H  . feeding supplement  1 Container Oral TID BM  . folic acid  1 mg Oral Daily  . multivitamin with minerals  1 tablet Oral Daily  . pantoprazole  40 mg Oral BID  . thiamine  100 mg Oral Daily     LOS: 4 days   Lonia BloodJeffrey T. , MD [facility] Office  [phone] Pager - Text Page per Amion  If 7PM-7AM, please contact night-coverage per Amion 11/19/2018, 2:11 PM

## 2018-11-19 NOTE — Progress Notes (Signed)
CardioVascular Research Department and AHF Team  ReDS Research Project   Patient #: [id number]  ReDS Measurement  Right: low quality x 3  Left:   

## 2018-11-20 LAB — FERRITIN: Ferritin: 43 ng/mL (ref 24–336)

## 2018-11-20 LAB — CBC WITH DIFFERENTIAL/PLATELET
Abs Immature Granulocytes: 0.08 10*3/uL — ABNORMAL HIGH (ref 0.00–0.07)
Basophils Absolute: 0 10*3/uL (ref 0.0–0.1)
Basophils Relative: 0 %
Eosinophils Absolute: 0 10*3/uL (ref 0.0–0.5)
Eosinophils Relative: 0 %
HCT: 32.9 % — ABNORMAL LOW (ref 39.0–52.0)
Hemoglobin: 10.9 g/dL — ABNORMAL LOW (ref 13.0–17.0)
Immature Granulocytes: 2 %
Lymphocytes Relative: 13 %
Lymphs Abs: 0.4 10*3/uL — ABNORMAL LOW (ref 0.7–4.0)
MCH: 28.9 pg (ref 26.0–34.0)
MCHC: 33.1 g/dL (ref 30.0–36.0)
MCV: 87.3 fL (ref 80.0–100.0)
Monocytes Absolute: 0.3 10*3/uL (ref 0.1–1.0)
Monocytes Relative: 9 %
Neutro Abs: 2.6 10*3/uL (ref 1.7–7.7)
Neutrophils Relative %: 76 %
Platelets: 78 10*3/uL — ABNORMAL LOW (ref 150–400)
RBC: 3.77 MIL/uL — ABNORMAL LOW (ref 4.22–5.81)
RDW: 14.9 % (ref 11.5–15.5)
WBC: 3.4 10*3/uL — ABNORMAL LOW (ref 4.0–10.5)
nRBC: 0 % (ref 0.0–0.2)

## 2018-11-20 LAB — COMPREHENSIVE METABOLIC PANEL
ALT: 55 U/L — ABNORMAL HIGH (ref 0–44)
AST: 74 U/L — ABNORMAL HIGH (ref 15–41)
Albumin: 2 g/dL — ABNORMAL LOW (ref 3.5–5.0)
Alkaline Phosphatase: 106 U/L (ref 38–126)
Anion gap: 4 — ABNORMAL LOW (ref 5–15)
BUN: 17 mg/dL (ref 6–20)
CO2: 23 mmol/L (ref 22–32)
Calcium: 7.7 mg/dL — ABNORMAL LOW (ref 8.9–10.3)
Chloride: 111 mmol/L (ref 98–111)
Creatinine, Ser: 0.76 mg/dL (ref 0.61–1.24)
GFR calc Af Amer: >60 mL/min (ref >60)
GFR calc non Af Amer: >60 mL/min (ref >60)
Glucose, Bld: 194 mg/dL — ABNORMAL HIGH (ref 70–99)
Potassium: 3.9 mmol/L (ref 3.5–5.1)
Sodium: 138 mmol/L (ref 135–145)
Total Bilirubin: 1.7 mg/dL — ABNORMAL HIGH (ref 0.3–1.2)
Total Protein: 5.1 g/dL — ABNORMAL LOW (ref 6.5–8.1)

## 2018-11-20 LAB — D-DIMER, QUANTITATIVE: D-Dimer, Quant: 2.55 ug/mL-FEU — ABNORMAL HIGH (ref 0.00–0.50)

## 2018-11-20 MED ORDER — GUAIFENESIN-DM 100-10 MG/5ML PO SYRP
5.0000 mL | ORAL_SOLUTION | ORAL | Status: DC | PRN
  Administered 2018-11-20 – 2018-11-23 (×12): 5 mL via ORAL
  Filled 2018-11-20 (×12): qty 5

## 2018-11-20 NOTE — Plan of Care (Signed)
  Problem: Clinical Measurements: Goal: Respiratory complications will improve Outcome: Progressing   Problem: Activity: Goal: Risk for activity intolerance will decrease Outcome: Progressing   

## 2018-11-20 NOTE — Progress Notes (Signed)
[place] TEAM 1 - Stepdown/ICU TEAM  Adoniram Shima  [id number]:[id number] DOB: [birth year] DOA: 11/15/2018 PCP: Patient, No Pcp Per    Brief Narrative:  42yo w/ a hx of alcohol abuse, cirrhosis, and portal gastropathy but w/ no varices on endoscopy 07/21/18 who presented to [facility] ED on 5/11 with several days of persistent fever and shortness of breath after he went to see his PCP and was found to be hypoxic. In the ED he was found to have sats of 76% on room air w/ a positive COVID test and was transfered to [facility].  Significant Events: 5/12 admit to [facility] from [facility] ED   COVID-19 specific Treatment: Actemra 5/12 Convalescent plasma 5/13 03:52 Steroids 5/12 > 5/14  Subjective: Says he is comfortable. Denies sob despite persisting dependence on supplement O2. No cp, n/v, or abdom pain. No evidence of EtOH withdrawal.   Assessment & Plan:  Acute hypoxic respiratory failure - COVID-19 pneumonitis  tolerated convalescent plasma w/o difficulty - no indication for further treatment with convalescent plasma at this time - clinically stable though still requiring signif O2 support - cont self proning - f/u CXR in AM   Recent Labs    11/18/18 0545 11/19/18 0607 11/20/18 0303  DDIMER 2.76* 2.73* 2.55*  FERRITIN 50 48 43    Alcohol abuse - Cirrhosis of Liver  Continue thiamine and folate - is reportedly non-compliant w/ outpt f/u - CIWA discontinued - no withdrawal at this time  Thrombocytopenia Due to EtOH abuse / cirrhosis - stable   HTN BP stable   Perianal abscess  Cont IV doxy - no evidence of complication at this time - continues to drain - may require I&D once stable to leave hospital   DVT prophylaxis: lovenox  Code Status: FULL CODE Family Communication:   Disposition Plan: transition to tele bed - ambulate - PT/OT   Consultants:  none  Antimicrobials:  Doxycycline 5/12 >  Objective: Blood pressure (!) 112/54, pulse 64, temperature 98 F  (36.7 C), temperature source Oral, resp. rate 19, height 5\' 7"  (1.702 m), weight 79.2 kg, SpO2 90 %.  Intake/Output Summary (Last 24 hours) at 11/20/2018 0812 Last data filed at 11/20/2018 0544 Gross per 24 hour  Intake 1668.5 ml  Output 1675 ml  Net -6.5 ml   Filed Weights   11/16/18 0133  Weight: 79.2 kg    Examination: General: remains comfortable despite high oxygen demand Lungs: fine crackles persist diffusely - no wheezing  Cardiovascular: RRR w/o M or rub  Abdomen: NT/ND, soft, BS+ Extremities: No C/C/E B LE   CBC: Recent Labs  Lab 11/18/18 0545 11/19/18 0607 11/20/18 0303  WBC 5.6 4.7 3.4*  NEUTROABS 4.7 3.8 2.6  HGB 11.2* 11.2* 10.9*  HCT 33.2* 32.5* 32.9*  MCV 87.8 86.4 87.3  PLT 93* 87* 78*   Basic Metabolic Panel: Recent Labs  Lab 11/18/18 0545 11/19/18 0607 11/20/18 0303  NA 140 139 138  K 3.9 4.0 3.9  CL 112* 111 111  CO2 23 23 23   GLUCOSE 152* 141* 194*  BUN 17 16 17   CREATININE 0.81 0.71 0.76  CALCIUM 7.9* 7.7* 7.7*  MG 1.9  --   --    GFR: Estimated Creatinine Clearance: 111.3 mL/min (by C-G formula based on SCr of 0.76 mg/dL).  Liver Function Tests: Recent Labs  Lab 11/16/18 0535 11/18/18 0545 11/19/18 0607 11/20/18 0303  AST 70*  70* 79* 91* 74*  ALT 23  23 33 54* 55*  ALKPHOS 116  117 100 91 106  BILITOT 3.1*  2.9* 1.7* 1.7* 1.7*  PROT 6.5  6.4* 5.8* 5.5* 5.1*  ALBUMIN 2.5*  2.4* 2.2* 2.2* 2.0*    Coagulation Profile: Recent Labs  Lab 11/18/18 0545  INR 1.5*     Recent Results (from the past 240 hour(s))  MRSA PCR Screening     Status: None   Collection Time: 11/16/18  9:46 AM  Result Value Ref Range Status   MRSA by PCR NEGATIVE NEGATIVE Final    Comment:        The GeneXpert MRSA Assay (FDA approved for NASAL specimens only), is one component of a comprehensive MRSA colonization surveillance program. It is not intended to diagnose MRSA infection nor to guide or monitor treatment for MRSA infections.  Performed at [facility], [address] [place], [place] [address]      Scheduled Meds: . enoxaparin (LOVENOX) injection  40 mg Subcutaneous Q12H  . feeding supplement  1 Container Oral TID BM  . folic acid  1 mg Oral Daily  . multivitamin with minerals  1 tablet Oral Daily  . pantoprazole  40 mg Oral BID  . thiamine  100 mg Oral Daily     LOS: 5 days   Lonia BloodJeffrey T. , MD [facility] Office  [phone] Pager - Text Page per Amion  If 7PM-7AM, please contact night-coverage per Amion 11/20/2018, 8:12 AM

## 2018-11-21 ENCOUNTER — Inpatient Hospital Stay (HOSPITAL_COMMUNITY): Payer: HRSA Program

## 2018-11-21 LAB — CBC WITH DIFFERENTIAL/PLATELET
Abs Immature Granulocytes: 0.06 10*3/uL (ref 0.00–0.07)
Basophils Absolute: 0 10*3/uL (ref 0.0–0.1)
Basophils Relative: 0 %
Eosinophils Absolute: 0.1 10*3/uL (ref 0.0–0.5)
Eosinophils Relative: 3 %
HCT: 34.3 % — ABNORMAL LOW (ref 39.0–52.0)
Hemoglobin: 11.3 g/dL — ABNORMAL LOW (ref 13.0–17.0)
Immature Granulocytes: 3 %
Lymphocytes Relative: 20 %
Lymphs Abs: 0.5 10*3/uL — ABNORMAL LOW (ref 0.7–4.0)
MCH: 28.6 pg (ref 26.0–34.0)
MCHC: 32.9 g/dL (ref 30.0–36.0)
MCV: 86.8 fL (ref 80.0–100.0)
Monocytes Absolute: 0.3 10*3/uL (ref 0.1–1.0)
Monocytes Relative: 11 %
Neutro Abs: 1.5 10*3/uL — ABNORMAL LOW (ref 1.7–7.7)
Neutrophils Relative %: 63 %
Platelets: 74 10*3/uL — ABNORMAL LOW (ref 150–400)
RBC: 3.95 MIL/uL — ABNORMAL LOW (ref 4.22–5.81)
RDW: 14.9 % (ref 11.5–15.5)
WBC: 2.4 10*3/uL — ABNORMAL LOW (ref 4.0–10.5)
nRBC: 0 % (ref 0.0–0.2)

## 2018-11-21 LAB — COMPREHENSIVE METABOLIC PANEL
ALT: 48 U/L — ABNORMAL HIGH (ref 0–44)
AST: 62 U/L — ABNORMAL HIGH (ref 15–41)
Albumin: 2 g/dL — ABNORMAL LOW (ref 3.5–5.0)
Alkaline Phosphatase: 98 U/L (ref 38–126)
Anion gap: 8 (ref 5–15)
BUN: 15 mg/dL (ref 6–20)
CO2: 21 mmol/L — ABNORMAL LOW (ref 22–32)
Calcium: 7.8 mg/dL — ABNORMAL LOW (ref 8.9–10.3)
Chloride: 112 mmol/L — ABNORMAL HIGH (ref 98–111)
Creatinine, Ser: 0.72 mg/dL (ref 0.61–1.24)
GFR calc Af Amer: >60 mL/min (ref >60)
GFR calc non Af Amer: >60 mL/min (ref >60)
Glucose, Bld: 91 mg/dL (ref 70–99)
Potassium: 3.8 mmol/L (ref 3.5–5.1)
Sodium: 141 mmol/L (ref 135–145)
Total Bilirubin: 1.8 mg/dL — ABNORMAL HIGH (ref 0.3–1.2)
Total Protein: 4.8 g/dL — ABNORMAL LOW (ref 6.5–8.1)

## 2018-11-21 LAB — PROCALCITONIN: Procalcitonin: <0.1 ng/mL

## 2018-11-21 LAB — FERRITIN: Ferritin: 35 ng/mL (ref 24–336)

## 2018-11-21 MED ORDER — BENZONATATE 100 MG PO CAPS
200.0000 mg | ORAL_CAPSULE | Freq: Three times a day (TID) | ORAL | Status: DC | PRN
  Administered 2018-11-21 (×2): 200 mg via ORAL
  Filled 2018-11-21 (×2): qty 2

## 2018-11-21 MED ORDER — FUROSEMIDE 10 MG/ML IJ SOLN
40.0000 mg | Freq: Once | INTRAMUSCULAR | Status: AC
  Administered 2018-11-21: 16:00:00 40 mg via INTRAVENOUS
  Filled 2018-11-21: qty 4

## 2018-11-21 MED ORDER — MENTHOL 3 MG MT LOZG
1.0000 | LOZENGE | OROMUCOSAL | Status: DC | PRN
  Administered 2018-11-21: 14:00:00 3 mg via ORAL
  Filled 2018-11-21: qty 9

## 2018-11-21 NOTE — Progress Notes (Signed)
The pt refused to prone at this time. Will prone later on today per pt. The pt was educated regarding the benefits of laying prone.

## 2018-11-21 NOTE — Progress Notes (Signed)
The pt's significant other and daughter was called and given a daily update regarding the pt's current status.

## 2018-11-21 NOTE — Progress Notes (Signed)
Pt requesting cough gtts. MD notified.

## 2018-11-21 NOTE — Progress Notes (Addendum)
[place] TEAM 1 - Stepdown/ICU TEAM  Kelby FamManuel Eickhoff  ZOX:[id number] DOB: May 18, 1976 DOA: 11/15/2018 PCP: Patient, No Pcp Per    Brief Narrative:  43yo w/ a hx of alcohol abuse, cirrhosis, and portal gastropathy but w/ no varices on endoscopy 07/21/18 who [facility]11 with several days of persistent fever and shortness of breath after he went to see his PCP and was found to be hypoxic. In the ED he was found to have sats of 76% on room air w/ a positive COVID test and was trans[facility].  Significant Events: 5/12 [facility]   COVID-19 specific Treatment: Actemra 5/12 Convalescent plasma 5/13 03:52 Steroids 5/12 > 5/14  Subjective: Continues to require high level HFNC O2 support.  Is becoming impatient with being in the hospital.  Voices a desire to be discharged home soon.  Reports ongoing discharge from his perirectal abscess but states that it feels much better.  Denies current chest pain nausea vomiting or abdominal pain.  Reports relatively poor appetite.  Assessment & Plan:  Acute hypoxic respiratory failure - COVID-19 pneumonitis  tolerated convalescent plasma w/o difficulty - no indication for further treatment with convalescent plasma at this time - clinically stable though still requiring signif O2 support - cont self proning - f/u CXR this morning shows some improvement - attempt to diurese to see if it will impact his O2 needs   Recent Labs    11/19/18 0607 11/20/18 0303 11/21/18 0220  DDIMER 2.73* 2.55*  --   FERRITIN 48 43 35    Alcohol abuse - Cirrhosis of Liver  Continue thiamine and folate - is reportedly non-compliant w/ outpt f/u - CIWA discontinued -still no evidence of withdrawal at this time  Thrombocytopenia Due to EtOH abuse / cirrhosis - stable -no spontaneous blood loss  HTN BP stable   Perianal abscess  Cont IV doxy - no evidence of complication at this time - continues to drain -  may require I&D once stable to leave hospital   DVT prophylaxis: lovenox  Code Status: FULL CODE Family Communication:   Disposition Plan: ambulate - PT/OT   Consultants:  none  Antimicrobials:  Doxycycline 5/12 >  Objective: Blood pressure (!) 112/56, pulse 72, temperature 98.4 F (36.9 C), temperature source Oral, resp. rate (!) 22, height 5\' 7"  (1.702 m), weight 79.2 kg, SpO2 93 %.  Intake/Output Summary (Last 24 hours) at 11/21/2018 0849 Last data filed at 11/21/2018 0421 Gross per 24 hour  Intake 1154.72 ml  Output 1500 ml  Net -345.28 ml   Filed Weights   11/16/18 0133  Weight: 79.2 kg    Examination: General: Laying in bed in prone position in no acute respiratory distress Lungs: fine crackles diffusely without change Cardiovascular: RRR  Abdomen: NT/ND, soft, BS+ Extremities: No C/C/E bilateral lower extremities  CBC: Recent Labs  Lab 11/19/18 0607 11/20/18 0303 11/21/18 0113  WBC 4.7 3.4* 2.4*  NEUTROABS 3.8 2.6 1.5*  HGB 11.2* 10.9* 11.3*  HCT 32.5* 32.9* 34.3*  MCV 86.4 87.3 86.8  PLT 87* 78* 74*   Basic Metabolic Panel: Recent Labs  Lab 11/18/18 0545 11/19/18 0607 11/20/18 0303 11/21/18 0113  NA 140 139 138 141  K 3.9 4.0 3.9 3.8  CL 112* 111 111 112*  CO2 23 23 23  21*  GLUCOSE 152* 141* 194* 91  BUN 17 16 17 15   CREATININE 0.81 0.71 0.76 0.72  CALCIUM 7.9* 7.7* 7.7* 7.8*  MG 1.9  --   --   --    GFR: Estimated Creatinine Clearance: 111.3 mL/min (by C-G formula based on SCr of 0.72 mg/dL).  Liver Function Tests: Recent Labs  Lab 11/18/18 0545 11/19/18 0607 11/20/18 0303 11/21/18 0113  AST 79* 91* 74* 62*  ALT 33 54* 55* 48*  ALKPHOS 100 91 106 98  BILITOT 1.7* 1.7* 1.7* 1.8*  PROT 5.8* 5.5* 5.1* 4.8*  ALBUMIN 2.2* 2.2* 2.0* 2.0*    Coagulation Profile: Recent Labs  Lab 11/18/18 0545  INR 1.5*     Recent Results (from the past 240 hour(s))  MRSA PCR Screening     Status: None   Collection Time: 11/16/18  9:46 AM   Result Value Ref Range Status   MRSA by PCR NEGATIVE NEGATIVE Final    Comment:        The GeneXpert MRSA Assay (FDA approved for NASAL specimens only), is one component of a comprehensive MRSA colonization surveillance program. It is not intended to diagnose MRSA infection nor to guide or monitor treatment for MRSA infections. Performed at [facility], [address] [place], [place] [address]      Scheduled Meds: . enoxaparin (LOVENOX) injection  40 mg Subcutaneous Q12H  . feeding supplement  1 Container Oral TID BM  . folic acid  1 mg Oral Daily  . multivitamin with minerals  1 tablet Oral Daily  . pantoprazole  40 mg Oral BID  . thiamine  100 mg Oral Daily     LOS: 6 days   Lonia Blood, MD [facility] Office  [phone] Pager - Text Page per Amion  If 7PM-7AM, please contact night-coverage per Amion 11/21/2018, 8:49 AM

## 2018-11-21 NOTE — Progress Notes (Signed)
CardioVascular Research Department and AHF Team  ReDS Research Project   Patient #: [id number]  ReDS Measurement  Right: 51 %  Left: low quality x 3

## 2018-11-22 LAB — CBC WITH DIFFERENTIAL/PLATELET
Abs Immature Granulocytes: 0.06 10*3/uL (ref 0.00–0.07)
Basophils Absolute: 0 10*3/uL (ref 0.0–0.1)
Basophils Relative: 0 %
Eosinophils Absolute: 0.1 10*3/uL (ref 0.0–0.5)
Eosinophils Relative: 3 %
HCT: 35.3 % — ABNORMAL LOW (ref 39.0–52.0)
Hemoglobin: 12.1 g/dL — ABNORMAL LOW (ref 13.0–17.0)
Immature Granulocytes: 2 %
Lymphocytes Relative: 17 %
Lymphs Abs: 0.5 10*3/uL — ABNORMAL LOW (ref 0.7–4.0)
MCH: 29.7 pg (ref 26.0–34.0)
MCHC: 34.3 g/dL (ref 30.0–36.0)
MCV: 86.7 fL (ref 80.0–100.0)
Monocytes Absolute: 0.3 10*3/uL (ref 0.1–1.0)
Monocytes Relative: 9 %
Neutro Abs: 2 10*3/uL (ref 1.7–7.7)
Neutrophils Relative %: 69 %
Platelets: 77 10*3/uL — ABNORMAL LOW (ref 150–400)
RBC: 4.07 MIL/uL — ABNORMAL LOW (ref 4.22–5.81)
RDW: 15.3 % (ref 11.5–15.5)
WBC: 2.9 10*3/uL — ABNORMAL LOW (ref 4.0–10.5)
nRBC: 0 % (ref 0.0–0.2)

## 2018-11-22 LAB — COMPREHENSIVE METABOLIC PANEL
ALT: 37 U/L (ref 0–44)
AST: 45 U/L — ABNORMAL HIGH (ref 15–41)
Albumin: 2.1 g/dL — ABNORMAL LOW (ref 3.5–5.0)
Alkaline Phosphatase: 106 U/L (ref 38–126)
Anion gap: 5 (ref 5–15)
BUN: 18 mg/dL (ref 6–20)
CO2: 22 mmol/L (ref 22–32)
Calcium: 7.8 mg/dL — ABNORMAL LOW (ref 8.9–10.3)
Chloride: 110 mmol/L (ref 98–111)
Creatinine, Ser: 0.7 mg/dL (ref 0.61–1.24)
GFR calc Af Amer: >60 mL/min (ref >60)
GFR calc non Af Amer: >60 mL/min (ref >60)
Glucose, Bld: 94 mg/dL (ref 70–99)
Potassium: 3.8 mmol/L (ref 3.5–5.1)
Sodium: 137 mmol/L (ref 135–145)
Total Bilirubin: 2.2 mg/dL — ABNORMAL HIGH (ref 0.3–1.2)
Total Protein: 5 g/dL — ABNORMAL LOW (ref 6.5–8.1)

## 2018-11-22 LAB — FERRITIN: Ferritin: 36 ng/mL (ref 24–336)

## 2018-11-22 LAB — C-REACTIVE PROTEIN: CRP: <0.8 mg/dL (ref <1.0)

## 2018-11-22 NOTE — Progress Notes (Signed)
Spoke with daughter who translated for the wife of the patient. I explained that I attempted to call his wife, but there was no answer. Daughter said it is better to call her since she has to translate anyway. Answered all questions regarding care.

## 2018-11-22 NOTE — Progress Notes (Signed)
[facility] PROGRESS NOTE    Progress Note  Michael Ayers  TAV:[id number] DOB: 1975/07/18 DOA: 11/15/2018 PCP: Patient, No Pcp Per     Brief Narrative:   Michael Ayers is an 43 y.o. male past medical history of alcohol abuse and cirrhosis portal gastropathy but with no varices on 07/21/2018 who presents to the [facility] ED on 11/15/2018 for fever and shortness of breath.  Went to his PCP was found to be hypoxic and transferred to [facility] as his saturations 76% on room air with a SARS-CoV-2 positive test  Significant events: 11/16/2018 admitted to [facility] campus  COVID-19 specific treatments: Actemra on 11/16/2018. Steroids 11/15/2020 11/18/2018. Convalescent plasma on 11/17/2018 03:52  Assessment/Plan:   Acute respiratory failure with hypoxia (HCC) due to SARS-CoV-2 infection: Currently on high flow nasal cannula satting greater than 91%. He did receive convalescent plasma Actemra and steroids. He is currently leukopenic and lymphopenic On 11/21/2018 showed improved multifocal pneumonia. He does relate that his breathing is unchanged from yesterday, he does get short of breath going to the bathroom. Keep patient prone for at least 16 hours a day. He is significantly lymphopenic, blood trending up.  Alcohol abuse Continue thiamine and folate no signs of withdrawal.  Chronic thrombocytopenia: Likely due to alcohol abuse no signs of bleeding.  Essential hypertension: BP stable.  Perianal abscess: Status post I&D continues to drain he completed a 7-day course of IV doxycycline.  AKI (acute kidney injury) [facility]): Hemodynamically mediated resolved with IV fluid hydration.  Transaminitis: Now improved.  DVT prophylaxis: lovexno Family Communication:none Disposition Plan/Barrier to D/C: once sat improved. Code Status:     Code Status Orders  (From admission, onward)         Start     Ordered   11/16/18 0200  Full code  Continuous      11/16/18 0210        Code Status History    Date Active Date Inactive Code Status Order ID Comments User Context   07/19/2018 1942 07/22/2018 1351 Full Code [id number]  Haydee Monica, MD Inpatient   03/27/2018 0238 03/30/2018 2106 Full Code [id number]  Lorretta Harp, MD ED   01/14/2018 2238 01/16/2018 1755 Full Code [id number]  Lorretta Harp, MD Inpatient        IV Access:    Peripheral IV   Procedures and diagnostic studies:   Dg Chest Port 1 View  Result Date: 11/21/2018 CLINICAL DATA:  Acute respiratory disease due to COVID-19 EXAM: PORTABLE CHEST 1 VIEW COMPARISON:  11/18/2018 FINDINGS: Multifocal pneumonia throughout the lungs bilaterally, with a peripheral predilection. Left upper lobe airspace opacity has improved, while the remaining is unchanged. No pleural effusion or pneumothorax. The heart is normal in size. IMPRESSION: Multifocal pneumonia, improving in the left upper lobe. Electronically Signed   By: Charline Bills M.D.   On: 11/21/2018 05:55     Medical Consultants:    None.  Anti-Infectives:   None  Subjective:    Michael Ayers he relates his breathing is unchanged compared to yesterday, he still short of breath just was standing up and trying to go to the bathroom.  Objective:    Vitals:   11/21/18 2344 11/22/18 0000 11/22/18 0100 11/22/18 0456  BP: 115/64 126/85 128/76 138/80  Pulse:  61 (!) 59   Resp:      Temp: 98.6 F (37 C)   98.2 F (36.8 C)  TempSrc: Oral   Oral  SpO2:  91% 93%   Weight:  Height:        Intake/Output Summary (Last 24 hours) at 11/22/2018 0825 Last data filed at 11/22/2018 0600 Gross per 24 hour  Intake 1657.94 ml  Output 3550 ml  Net -1892.06 ml   Filed Weights   11/16/18 0133  Weight: 79.2 kg    Exam: General exam: In no acute distress Respiratory system: Good air movement with diffuse crackles bilaterally. Cardiovascular system: Regular rate and rhythm with positive S1-S2. Gastrointestinal system:  Positive bowel sounds soft nontender nondistended Central nervous system: Awake alert and oriented x3 nonfocal. Extremities: No lower extremity edema. Skin: No rashes or ulceration. Psychiatry: Judgment and insight appear normal.   Data Reviewed:    Labs: Basic Metabolic Panel: Recent Labs  Lab 11/18/18 0545 11/19/18 0607 11/20/18 0303 11/21/18 0113 11/22/18 0421  NA 140 139 138 141 137  K 3.9 4.0 3.9 3.8 3.8  CL 112* 111 111 112* 110  CO2 23 23 23  21* 22  GLUCOSE 152* 141* 194* 91 94  BUN 17 16 17 15 18   CREATININE 0.81 0.71 0.76 0.72 0.70  CALCIUM 7.9* 7.7* 7.7* 7.8* 7.8*  MG 1.9  --   --   --   --    GFR Estimated Creatinine Clearance: 111.3 mL/min (by C-G formula based on SCr of 0.7 mg/dL). Liver Function Tests: Recent Labs  Lab 11/18/18 0545 11/19/18 0607 11/20/18 0303 11/21/18 0113 11/22/18 0421  AST 79* 91* 74* 62* 45*  ALT 33 54* 55* 48* 37  ALKPHOS 100 91 106 98 106  BILITOT 1.7* 1.7* 1.7* 1.8* 2.2*  PROT 5.8* 5.5* 5.1* 4.8* 5.0*  ALBUMIN 2.2* 2.2* 2.0* 2.0* 2.1*   No results for input(s): LIPASE, AMYLASE in the last 168 hours. No results for input(s): AMMONIA in the last 168 hours. Coagulation profile Recent Labs  Lab 11/18/18 0545  INR 1.5*   COVID-19 Labs  Recent Labs    11/20/18 0303 11/21/18 0220 11/22/18 0421  DDIMER 2.55*  --   --   FERRITIN 43 35 36  CRP  --   --  <0.8    No results found for: SARSCOV2NAA  CBC: Recent Labs  Lab 11/18/18 0545 11/19/18 0607 11/20/18 0303 11/21/18 0113 11/22/18 0421  WBC 5.6 4.7 3.4* 2.4* 2.9*  NEUTROABS 4.7 3.8 2.6 1.5* 2.0  HGB 11.2* 11.2* 10.9* 11.3* 12.1*  HCT 33.2* 32.5* 32.9* 34.3* 35.3*  MCV 87.8 86.4 87.3 86.8 86.7  PLT 93* 87* 78* 74* 77*   Cardiac Enzymes: No results for input(s): CKTOTAL, CKMB, CKMBINDEX, TROPONINI in the last 168 hours. BNP (last 3 results) No results for input(s): PROBNP in the last 8760 hours. CBG: No results for input(s): GLUCAP in the last 168 hours.  D-Dimer: Recent Labs    11/20/18 0303  DDIMER 2.55*   Hgb A1c: No results for input(s): HGBA1C in the last 72 hours. Lipid Profile: No results for input(s): CHOL, HDL, LDLCALC, TRIG, CHOLHDL, LDLDIRECT in the last 72 hours. Thyroid function studies: No results for input(s): TSH, T4TOTAL, T3FREE, THYROIDAB in the last 72 hours.  Invalid input(s): FREET3 Anemia work up: Recent Labs    11/21/18 0220 11/22/18 0421  FERRITIN 35 36   Sepsis Labs: Recent Labs  Lab 11/16/18 0535  11/19/18 0607 11/20/18 0303 11/21/18 0113 11/22/18 0421  PROCALCITON 0.62  --   --   --  <0.10  --   WBC 4.0   < > 4.7 3.4* 2.4* 2.9*   < > = values in this interval not  displayed.   Microbiology Recent Results (from the past 240 hour(s))  MRSA PCR Screening     Status: None   Collection Time: 11/16/18  9:46 AM  Result Value Ref Range Status   MRSA by PCR NEGATIVE NEGATIVE Final    Comment:        The GeneXpert MRSA Assay (FDA approved for NASAL specimens only), is one component of a comprehensive MRSA colonization surveillance program. It is not intended to diagnose MRSA infection nor to guide or monitor treatment for MRSA infections. Performed at [facility], [address], [place], [place] [address]      Medications:   . enoxaparin (LOVENOX) injection  40 mg Subcutaneous Q12H  . feeding supplement  1 Container Oral TID BM  . folic acid  1 mg Oral Daily  . multivitamin with minerals  1 tablet Oral Daily  . pantoprazole  40 mg Oral BID  . thiamine  100 mg Oral Daily   Continuous Infusions: . sodium chloride 10 mL/hr at 11/20/18 1800  . doxycycline (VIBRAMYCIN) IV Stopped (11/22/18 0012)      LOS: 7 days   Marinda Elk  [facility]  11/22/2018, 8:25 AM

## 2018-11-22 NOTE — Progress Notes (Signed)
CardioVascular Research Department and AHF Team  ReDS Research Project   Patient #: [id number]  ReDS Measurement  Right: low quality x 3  Left:   

## 2018-11-23 MED ORDER — ENSURE ENLIVE PO LIQD
237.0000 mL | Freq: Two times a day (BID) | ORAL | Status: DC
  Administered 2018-11-23 – 2018-11-25 (×4): 237 mL via ORAL

## 2018-11-23 MED ORDER — BOOST / RESOURCE BREEZE PO LIQD CUSTOM
1.0000 | ORAL | Status: DC
  Administered 2018-11-24: 1 via ORAL
  Filled 2018-11-23 (×4): qty 1

## 2018-11-23 NOTE — Progress Notes (Addendum)
Nutrition Follow-up RD working remotely.  DOCUMENTATION CODES:   Not applicable  INTERVENTION:    Boost Breeze po once daily, each supplement provides 250 kcal and 9 grams of protein   Ensure Enlive po BID, each supplement provides 350 kcal and 20 grams of protein  Patient is also receiving supplements on meal trays:   Magic cup BID with lunch and supper, each supplement provides 290 kcal and 9 grams of protein   Vital Cuisine Shake with breakfast daily, each supplement provides 520 kcal and 22 grams of protein   NUTRITION DIAGNOSIS:   Increased nutrient needs related to acute illness(COVID-19) as evidenced by estimated needs.  Ongoing  GOAL:   Patient will meet greater than or equal to 90% of their needs  Progressing  MONITOR:   PO intake, Supplement acceptance, Labs, I & O's  ASSESSMENT:   43 yo male with PMH of alcohol abuse, esophageal varices, hepatitis, cirrhosis, and HTN who was admitted with COVID-19.   Intake of meals has improved since last week. Patient consuming 75-100% of meals 5/15 and 5/16. For the past 2 days, intake has declined. Patient is also being offered Boost Breeze between meals TID. RN has also offered Ensure.    No new weight available.  Labs and medications reviewed.   Diet Order:   Diet Order            Diet regular Room service appropriate? Yes; Fluid consistency: Thin; Fluid restriction: 1500 mL Fluid  Diet effective now              EDUCATION NEEDS:   No education needs have been identified at this time  Skin:  Skin Assessment: Skin Integrity Issues: Skin Integrity Issues:: Other (Comment) Other: wound to buttocks  Last BM:  5/19  Height:   Ht Readings from Last 1 Encounters:  11/16/18 5\' 7"  (1.702 m)    Weight:   Wt Readings from Last 1 Encounters:  11/16/18 79.2 kg    Ideal Body Weight:  67.3 kg  BMI:  Body mass index is 27.35 kg/m.  Estimated Nutritional Needs:   Kcal:  2100-2300  Protein:   100-115 gm  Fluid:  2.2 L    Joaquin Courts, RD, LDN, CNSC Pager [phone] After Hours Pager [phone]

## 2018-11-23 NOTE — Progress Notes (Signed)
[facility] Research Department and AHF Team  ReDS Research Project   Patient #: [id number]  ReDS Measurement  Right: 44 % Left: low quality x 3

## 2018-11-23 NOTE — Progress Notes (Signed)
[facility] PROGRESS NOTE    Progress Note  Michael Ayers  [id number]:[id number] DOB: [birth year] DOA: 11/15/2018 PCP: Patient, No Pcp Per     Brief Narrative:   Michael Ayers is an 43 y.o. 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  Went to his PCP was found to be hypoxic and transferred to [facility] as his saturations 76% on room air with a SARS-CoV-2 positive test  Significant events: 11/16/2018 admitted to [facility]  COVID-19 specific treatments: Actemra on 11/16/2018. Steroids 11/15/2020 11/18/2018. Convalescent plasma on 11/17/2018 03:52  Assessment/Plan:   Acute respiratory failure with hypoxia (HCC) due to SARS-CoV-2 infection: Currently on high flow nasal cannula satting greater than 91%. He received convalescent plasma Actemra and steroids. CXR on 11/21/2018 showed improved multifocal pneumonia. Keep patient prone for at least 16 hours a day. He relates his breathing is unchanged compared to yesterday.  Still requiring 10 L on high flow nasal cannula satting greater than 92%.  Alcohol abuse Continue thiamine and folate no signs of withdrawal.  Chronic thrombocytopenia: Likely due to alcohol abuse no signs of bleeding.  Essential hypertension: BP stable.  Perianal abscess: Status post I&D continues to drain he completed a 7-day course of IV doxycycline.  AKI (acute kidney injury) [facility]): Hemodynamically mediated resolved with IV fluid hydration.  Transaminitis: Now improved.  DVT prophylaxis: lovenox Family Communication:none Disposition Plan/Barrier to D/C: once sat improved. Code Status:     Code Status Orders  (From admission, onward)         Start     Ordered   11/16/18 0200  Full code  Continuous     11/16/18 0210        Code Status History    Date Active Date  Inactive Code Status Order ID Comments User Context   07/19/2018 1942 07/22/2018 1351 Full Code [id number]  Haydee Monica, MD Inpatient   03/27/2018 0238 03/30/2018 2106 Full Code [id number]  Lorretta Harp, MD ED   01/14/2018 2238 01/16/2018 1755 Full Code [id number]  Lorretta Harp, MD Inpatient        IV Access:    Peripheral IV   Procedures and diagnostic studies:   No results found.   Medical Consultants:    None.  Anti-Infectives:   None  Subjective:    Michael Ayers he relates his breathing is unchanged compared to yesterday.  Objective:    Vitals:   11/23/18 0019 11/23/18 0414 11/23/18 0415 11/23/18 0435  BP: 113/67  114/64   Pulse: 70  67 61  Resp: 20  (!) 21 16  Temp: 98.5 F (36.9 C) 99.2 F (37.3 C)    TempSrc: Oral Oral    SpO2: 92%  97% 94%  Weight:      Height:        Intake/Output Summary (Last 24 hours) at 11/23/2018 0727 Last data filed at 11/23/2018 6440 Gross per 24 hour  Intake 275 ml  Output 1175 ml  Net -900 ml   Filed Weights   11/16/18 0133  Weight: 79.2 kg    Exam: General exam: In no acute distress. Respiratory system: Good air movement with diffuse crackles. Cardiovascular system: Rate and rhythm with positive S1-S2. Gastrointestinal system: Abdomen is nondistended, soft and nontender.  Central nervous system: Alert and oriented. No focal neurological deficits. Extremities: No pedal edema. Skin: No rashes, lesions  or ulcers Psychiatry: Judgement and insight appear normal. Mood & affect appropriate.     Data Reviewed:    Labs: Basic Metabolic Panel: Recent Labs  Lab 11/18/18 0545 11/19/18 0607 11/20/18 0303 11/21/18 0113 11/22/18 0421  NA 140 139 138 141 137  K 3.9 4.0 3.9 3.8 3.8  CL 112* 111 111 112* 110  CO2 23 23 23  21* 22  GLUCOSE 152* 141* 194* 91 94  BUN 17 16 17 15 18   CREATININE 0.81 0.71 0.76 0.72 0.70  CALCIUM 7.9* 7.7* 7.7* 7.8* 7.8*  MG 1.9  --   --   --   --    GFR Estimated Creatinine  Clearance: 111.3 mL/min (by C-G formula based on SCr of 0.7 mg/dL). Liver Function Tests: Recent Labs  Lab 11/18/18 0545 11/19/18 0607 11/20/18 0303 11/21/18 0113 11/22/18 0421  AST 79* 91* 74* 62* 45*  ALT 33 54* 55* 48* 37  ALKPHOS 100 91 106 98 106  BILITOT 1.7* 1.7* 1.7* 1.8* 2.2*  PROT 5.8* 5.5* 5.1* 4.8* 5.0*  ALBUMIN 2.2* 2.2* 2.0* 2.0* 2.1*   No results for input(s): LIPASE, AMYLASE in the last 168 hours. No results for input(s): AMMONIA in the last 168 hours. Coagulation profile Recent Labs  Lab 11/18/18 0545  INR 1.5*   COVID-19 Labs  Recent Labs    11/21/18 0220 11/22/18 0421  FERRITIN 35 36  CRP  --  <0.8    No results found for: SARSCOV2NAA  CBC: Recent Labs  Lab 11/18/18 0545 11/19/18 0607 11/20/18 0303 11/21/18 0113 11/22/18 0421  WBC 5.6 4.7 3.4* 2.4* 2.9*  NEUTROABS 4.7 3.8 2.6 1.5* 2.0  HGB 11.2* 11.2* 10.9* 11.3* 12.1*  HCT 33.2* 32.5* 32.9* 34.3* 35.3*  MCV 87.8 86.4 87.3 86.8 86.7  PLT 93* 87* 78* 74* 77*   Cardiac Enzymes: No results for input(s): CKTOTAL, CKMB, CKMBINDEX, TROPONINI in the last 168 hours. BNP (last 3 results) No results for input(s): PROBNP in the last 8760 hours. CBG: No results for input(s): GLUCAP in the last 168 hours. D-Dimer: No results for input(s): DDIMER in the last 72 hours. Hgb A1c: No results for input(s): HGBA1C in the last 72 hours. Lipid Profile: No results for input(s): CHOL, HDL, LDLCALC, TRIG, CHOLHDL, LDLDIRECT in the last 72 hours. Thyroid function studies: No results for input(s): TSH, T4TOTAL, T3FREE, THYROIDAB in the last 72 hours.  Invalid input(s): FREET3 Anemia work up: Recent Labs    11/21/18 0220 11/22/18 0421  FERRITIN 35 36   Sepsis Labs: Recent Labs  Lab 11/19/18 0607 11/20/18 0303 11/21/18 0113 11/22/18 0421  PROCALCITON  --   --  <0.10  --   WBC 4.7 3.4* 2.4* 2.9*   Microbiology Recent Results (from the past 240 hour(s))  MRSA PCR Screening     Status: None    Collection Time: 11/16/18  9:46 AM  Result Value Ref Range Status   MRSA by PCR NEGATIVE NEGATIVE Final    Comment:        The GeneXpert MRSA Assay (FDA approved for NASAL specimens only), is one component of a comprehensive MRSA colonization surveillance program. It is not intended to diagnose MRSA infection nor to guide or monitor treatment for MRSA infections. Performed at [facility], [address] [place], [place] [id number]      Medications:   . enoxaparin (LOVENOX) injection  40 mg Subcutaneous Q12H  . feeding supplement  1 Container Oral TID BM  . folic acid  1  mg Oral Daily  . multivitamin with minerals  1 tablet Oral Daily  . pantoprazole  40 mg Oral BID  . thiamine  100 mg Oral Daily   Continuous Infusions: . sodium chloride 10 mL/hr at 11/23/18 0412  . doxycycline (VIBRAMYCIN) IV 100 mg (11/22/18 2222)      LOS: 8 days   Marinda Elk  [facility]  11/23/2018, 7:27 AM

## 2018-11-24 ENCOUNTER — Inpatient Hospital Stay: Payer: Self-pay

## 2018-11-24 NOTE — Progress Notes (Signed)
CardioVascular Rewsearch Department and AHF Team  ReDS Research Project   Patient #: [id number]  ReDS Measurement  Right:  Low quality x3  Left: low quality x 3

## 2018-11-24 NOTE — Progress Notes (Signed)
[facility] PROGRESS NOTE    Progress Note  Michael FamManuel Beach  VHQ:[id number] DOB: 1975/10/12 DOA: 11/15/2018 PCP: Patient, No Pcp Per     Brief Narrative:   Michael Ayers is an 43 y.o. 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  Went to his PCP was found to be hypoxic and transferred [facility] as his saturations 76% on room air with a SARS-CoV-2 positive test  Significant events: [facility]16/2018 [facility]  COVID-19 specific treatments: Actemra on 11/16/2018. Steroids 11/15/2020 11/18/2018. Convalescent plasma on 11/17/2018 03:52  Assessment/Plan:   Acute respiratory failure with hypoxia (HCC) due to SARS-CoV-2 infection: Patient has been weaned off his oxygen but still requiring 2 L on nasal cannula satting greater than 90%. CXR on 11/21/2018 showed improved multifocal pneumonia. Continue to keep patient prone for at least 16 hours a day.  Alcohol abuse Continue thiamine and folate no signs of withdrawal.  Chronic thrombocytopenia: Likely due to alcohol abuse no signs of bleeding.  Essential hypertension: BP stable.  Perianal abscess: Status post I&D continues to drain he completed a 7-day course of IV doxycycline.  AKI (acute kidney injury) [facility](HCC): Hemodynamically mediated resolved with IV fluid hydration.  Transaminitis: Now improved.  DVT prophylaxis: lovenox Family Communication:none Disposition Plan/Barrier to D/C: once sat improved. Code Status:     Code Status Orders  (From admission, onward)         Start     Ordered   11/16/18 0200  Full code  Continuous     11/16/18 0210        Code Status History    Date Active Date Inactive Code Status Order ID Comments User Context   07/19/2018 1942 07/22/2018 1351 Full Code [id number]  Haydee Monicaavid, Rachal A, MD Inpatient   03/27/2018 0238 03/30/2018 2106 Full Code [id number]  Lorretta HarpNiu, Xilin, MD ED   01/14/2018 2238 01/16/2018 1755 Full Code [id number]  Lorretta HarpNiu, Xilin, MD Inpatient        IV Access:    Peripheral IV   Procedures and diagnostic studies:   No results found.   Medical Consultants:    None.  Anti-Infectives:   None  Subjective:    Michael FamManuel Parcel he relates his breathing today is slightly better.  Objective:    Vitals:   11/24/18 0245 11/24/18 0257 11/24/18 0310 11/24/18 0528  BP:    (!) 102/56  Pulse:    64  Resp:    (!) 23  Temp:    97.7 F (36.5 C)  TempSrc:      SpO2: 99% 97% (!) 89% 91%  Weight:      Height:        Intake/Output Summary (Last 24 hours) at 11/24/2018 0834 Last data filed at 11/24/2018 0528 Gross per 24 hour  Intake 720 ml  Output 1600 ml  Net -880 ml   Filed Weights   11/16/18 0133  Weight: 79.2 kg    Exam: General exam: In no acute distress. Respiratory system: Good air movement and diffuse crackles bilaterally. Cardiovascular system: S1 & S2 heard, RRR. Gastrointestinal system: Abdomen is nondistended, soft and nontender.  Central nervous system: Alert and oriented. No focal neurological deficits. Extremities: No pedal edema. Skin: No rashes, lesions or ulcers Psychiatry: Judgement and insight appear normal. Mood & affect appropriate.    Data Reviewed:    Labs: Basic  Metabolic Panel: Recent Labs  Lab 11/18/18 0545 11/19/18 0607 11/20/18 0303 11/21/18 0113 11/22/18 0421  NA 140 139 138 141 137  K 3.9 4.0 3.9 3.8 3.8  CL 112* 111 111 112* 110  CO2 21* 22  GLUCOSE 152* 141* 194* 91 94  BUN CREATININE 0.81 0.71 0.76 0.72 0.70  CALCIUM 7.9* 7.7* 7.7* 7.8* 7.8*  MG 1.9  --   --   --   --    GFR Estimated Creatinine Clearance: 111.3 mL/min (by C-G formula based on SCr of 0.7 mg/dL). Liver Function Tests: Recent Labs  Lab 11/18/18 0545 11/19/18 0607 11/20/18 0303 11/21/18 0113 11/22/18 0421  AST  79* 91* 74* 62* 45*  ALT 33 54* 55* 48* 37  ALKPHOS 100 91 106 98 106  BILITOT 1.7* 1.7* 1.7* 1.8* 2.2*  PROT 5.8* 5.5* 5.1* 4.8* 5.0*  ALBUMIN 2.2* 2.2* 2.0* 2.0* 2.1*   No results for input(s): LIPASE, AMYLASE in the last 168 hours. No results for input(s): AMMONIA in the last 168 hours. Coagulation profile Recent Labs  Lab 11/18/18 0545  INR 1.5*   COVID-19 Labs  Recent Labs    11/22/18 0421  FERRITIN 36  CRP <0.8    No results found for: SARSCOV2NAA  CBC: Recent Labs  Lab 11/18/18 0545 11/19/18 0607 11/20/18 0303 11/21/18 0113 11/22/18 0421  WBC 5.6 4.7 3.4* 2.4* 2.9*  NEUTROABS 4.7 3.8 2.6 1.5* 2.0  HGB 11.2* 11.2* 10.9* 11.3* 12.1*  HCT 33.2* 32.5* 32.9* 34.3* 35.3*  MCV 87.8 86.4 87.3 86.8 86.7  PLT 93* 87* 78* 74* 77*   Cardiac Enzymes: No results for input(s): CKTOTAL, CKMB, CKMBINDEX, TROPONINI in the last 168 hours. BNP (last 3 results) No results for input(s): PROBNP in the last 8760 hours. CBG: No results for input(s): GLUCAP in the last 168 hours. D-Dimer: No results for input(s): DDIMER in the last 72 hours. Hgb A1c: No results for input(s): HGBA1C in the last 72 hours. Lipid Profile: No results for input(s): CHOL, HDL, LDLCALC, TRIG, CHOLHDL, LDLDIRECT in the last 72 hours. Thyroid function studies: No results for input(s): TSH, T4TOTAL, T3FREE, THYROIDAB in the last 72 hours.  Invalid input(s): FREET3 Anemia work up: Recent Labs    11/22/18 0421  FERRITIN 36   Sepsis Labs: Recent Labs  Lab 11/19/18 0607 11/20/18 0303 11/21/18 0113 11/22/18 0421  PROCALCITON  --   --  <0.10  --   WBC 4.7 3.4* 2.4* 2.9*   Microbiology Recent Results (from the past 240 hour(s))  MRSA PCR Screening     Status: None   Collection Time: 11/16/18  9:46 AM  Result Value Ref Range Status   MRSA by PCR NEGATIVE NEGATIVE Final    Comment:        The GeneXpert MRSA Assay (FDA approved for NASAL specimens only), is one component of a comprehensive  MRSA colonization surveillance program. It is not intended to diagnose MRSA infection nor to guide or monitor treatment for MRSA infections. Performed at [facility], [address], [place], [place] [address]      Medications:   . enoxaparin (LOVENOX) injection  40 mg Subcutaneous Q12H  . feeding supplement  1 Container Oral Q24H  . feeding supplement (ENSURE ENLIVE)  237 mL Oral BID BM  . folic acid  1 mg Oral Daily  . multivitamin with minerals  1 tablet Oral Daily  . pantoprazole  40 mg Oral BID  .  thiamine  100 mg Oral Daily   Continuous Infusions: . sodium chloride 10 mL/hr at 11/23/18 0412      LOS: 9 days   Marinda Elk  [facility]  11/24/2018, 8:34 AM

## 2018-11-24 NOTE — Progress Notes (Signed)
Ambulated 400 ft on room air, maintained sats >89%

## 2018-11-25 DIAGNOSIS — J069 Acute upper respiratory infection, unspecified: Secondary | ICD-10-CM

## 2018-11-25 NOTE — Progress Notes (Addendum)
Patient discharged to home, AVS reviewed and all questions answered.   Pulse ox and thermometer provided

## 2018-11-25 NOTE — Discharge Summary (Signed)
Physician Discharge Summary  Michael Ayers WUJ:[id number] DOB: [birth year] DOA: 11/15/2018  PCP: Patient, No Pcp Per  Admit date: 11/15/2018 Discharge date: 11/25/2018  Admitted From: Home Disposition:  Home  Recommendations for Outpatient Follow-up:  1. Follow up with PCP in 1-2 weeks   Home Health:no Equipment/Devices:None  Discharge Condition:Stable CODE STATUS:Full Diet recommendation: Heart Healthy  Brief/Interim Summary: 43 y.o. 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  Went to his PCP was found to be hypoxic and transferred to [facility] as his saturations 76% on room air with a SARS-CoV-2 positive test  Significant events: 11/16/2018 admitted to [facility] campus  COVID-19 specific treatments: Actemra on 11/16/2018. Steroids 11/15/2020 11/18/2018. Convalescent plasma on 11/17/2018 03:52  Discharge Diagnoses:  Principal Problem:   Acute respiratory failure with hypoxia [facility]) Active Problems:   Alcohol abuse   Esophageal varices (HCC)   Essential hypertension   Abnormal LFTs   Thrombocytopenia (HCC)   COVID-19 virus infection   Overweight (BMI 25.0-29.9)   AKI (acute kidney injury) (HCC) Acute respiratory failure with hypoxia due to SARS-CoV-2 pneumonia infection: He was admitted to the ICU started on 15 L high flow nasal cannula. He was started on IV steroids and Actemra on 11/16/2018. He was given convalescent plasma the next day as his respiration did not improve. And slowly after that he started to improve and he was weaned off oxygen.  Until the day of discharge that he was off oxygen and ambulating in his saturations remained greater than 92% with ambulation.  Alcohol abuse: No signs of withdrawal he was continue on thiamine and folate.  Chronic thrombocytopenia: Likely due to alcohol abuse  no signs of bleeding.  Essential hypertension: BP is stable  Perianal abscess: He completed 7-day course of IV doxycycline.  Acute kidney injury: Likely hemodynamically mediated in the setting of SARS-CoV-2, she was started on IV fluids and creatinine returned to baseline.  Elevated LFTs: Likely due to alcohol abuse.   Discharge Instructions  Discharge Instructions    Diet - low sodium heart healthy   Complete by:  As directed    Increase activity slowly   Complete by:  As directed      Allergies as of 11/25/2018   No Known Allergies     Medication List    STOP taking these medications   cephALEXin 500 MG capsule Commonly known as:  KEFLEX   ondansetron 4 MG tablet Commonly known as:  ZOFRAN   pantoprazole 40 MG tablet Commonly known as:  PROTONIX   thiamine 100 MG tablet     TAKE these medications   docusate sodium 250 MG capsule Commonly known as:  COLACE Take 1 capsule (250 mg total) by mouth daily.   folic acid 1 MG tablet Commonly known as:  FOLVITE Take 1 tablet (1 mg total) by mouth daily.   ibuprofen 600 MG tablet Commonly known as:  ADVIL Take 1 tablet (600 mg total) by mouth every 6 (six) hours as needed.   multivitamin with minerals Tabs tablet Take 1 tablet by mouth daily.      Follow-up Information    [facility] Follow up on 11/24/2018.   Why:  at 3:30P for your televisit appointment; Please be available for the call.  Contact information: [address] [place] [address] [phone]  No Known Allergies  Consultations:  PCCM   Procedures/Studies: Dg Chest Port 1 View  Result Date: 11/21/2018 CLINICAL DATA:  Acute respiratory disease due to COVID-19 EXAM: PORTABLE CHEST 1 VIEW COMPARISON:  11/18/2018 FINDINGS: Multifocal pneumonia throughout the lungs bilaterally, with a peripheral predilection. Left upper lobe airspace opacity has improved, while the remaining is  unchanged. No pleural effusion or pneumothorax. The heart is normal in size. IMPRESSION: Multifocal pneumonia, improving in the left upper lobe. Electronically Signed   By: Charline BillsSriyesh  Krishnan M.D.   On: 11/21/2018 05:55   Dg Chest Port 1 View  Result Date: 11/18/2018 CLINICAL DATA:  43 year old male COVID-19. EXAM: PORTABLE CHEST 1 VIEW COMPARISON:  11/17/2018 and earlier. FINDINGS: Portable AP semi upright view at 0458 hours. Widespread and confluent but indistinct bilateral pulmonary opacity persists. Lung volumes and ventilation have not significantly changed since a [facility] portable chest on 11/15/2018. Stable cardiac size and mediastinal contours. No pneumothorax or pleural effusion. Visualized tracheal air column is within normal limits. IMPRESSION: Bilateral COVID-19 pneumonia without significant change since 11/15/2018. Electronically Signed   By: Odessa FlemingH  Hall M.D.   On: 11/18/2018 08:07   Dg Chest Port 1 View  Result Date: 11/17/2018 CLINICAL DATA:  Hypoxia EXAM: PORTABLE CHEST 1 VIEW COMPARISON:  11/15/2018 FINDINGS: Extensive bilateral airspace opacities are again noted, not significantly changed. No visible significant effusions. Heart is normal size. No acute bony abnormality. IMPRESSION: Extensive bilateral airspace disease, not significantly changed compatible with pneumonia. Atypical/viral pneumonia is possible. Electronically Signed   By: Charlett NoseKevin  Dover M.D.   On: 11/17/2018 12:50     Subjective: No complains  Discharge Exam: Vitals:   11/25/18 0538 11/25/18 0934  BP:  110/62  Pulse:  72  Resp:  11  Temp: 98.7 F (37.1 C) 98.1 F (36.7 C)  SpO2:       General: Pt is alert, awake, not in acute distress Cardiovascular: RRR, S1/S2 +, no rubs, no gallops Respiratory: CTA bilaterally, no wheezing, no rhonchi Abdominal: Soft, NT, ND, bowel sounds + Extremities: no edema, no cyanosis    The results of significant diagnostics from this hospitalization (including  imaging, microbiology, ancillary and laboratory) are listed below for reference.     Microbiology: Recent Results (from the past 240 hour(s))  MRSA PCR Screening     Status: None   Collection Time: 11/16/18  9:46 AM  Result Value Ref Range Status   MRSA by PCR NEGATIVE NEGATIVE Final    Comment:        The GeneXpert MRSA Assay (FDA approved for NASAL specimens only), is one component of a comprehensive MRSA colonization surveillance program. It is not intended to diagnose MRSA infection nor to guide or monitor treatment for MRSA infections. Performed at [facility], [address] [place], [place] [id number]      Labs: BNP (last 3 results) No results for input(s): BNP in the last 8760 hours. Basic Metabolic Panel: Recent Labs  Lab 11/19/18 0607 11/20/18 0303 11/21/18 0113 11/22/18 0421  NA 139 138 141 137  K 4.0 3.9 3.8 3.8  CL 111 111 112* 110  CO2 23 23 21* 22  GLUCOSE 141* 194* 91 94  BUN 16 17 15 18   CREATININE 0.71 0.76 0.72 0.70  CALCIUM 7.7* 7.7* 7.8* 7.8*   Liver Function Tests: Recent Labs  Lab 11/19/18 0607 11/20/18 0303 11/21/18 0113 11/22/18 0421  AST 91* 74* 62* 45*  ALT 54* 55* 48* 37  ALKPHOS 91 106 98  106  BILITOT 1.7* 1.7* 1.8* 2.2*  PROT 5.5* 5.1* 4.8* 5.0*  ALBUMIN 2.2* 2.0* 2.0* 2.1*   No results for input(s): LIPASE, AMYLASE in the last 168 hours. No results for input(s): AMMONIA in the last 168 hours. CBC: Recent Labs  Lab 11/19/18 0607 11/20/18 0303 11/21/18 0113 11/22/18 0421  WBC 4.7 3.4* 2.4* 2.9*  NEUTROABS 3.8 2.6 1.5* 2.0  HGB 11.2* 10.9* 11.3* 12.1*  HCT 32.5* 32.9* 34.3* 35.3*  MCV 86.4 87.3 86.8 86.7  PLT 87* 78* 74* 77*   Cardiac Enzymes: No results for input(s): CKTOTAL, CKMB, CKMBINDEX, TROPONINI in the last 168 hours. BNP: Invalid input(s): POCBNP CBG: No results for input(s): GLUCAP in the last 168 hours. D-Dimer No results for input(s): DDIMER in the last 72 hours. Hgb A1c No  results for input(s): HGBA1C in the last 72 hours. Lipid Profile No results for input(s): CHOL, HDL, LDLCALC, TRIG, CHOLHDL, LDLDIRECT in the last 72 hours. Thyroid function studies No results for input(s): TSH, T4TOTAL, T3FREE, THYROIDAB in the last 72 hours.  Invalid input(s): FREET3 Anemia work up No results for input(s): VITAMINB12, FOLATE, FERRITIN, TIBC, IRON, RETICCTPCT in the last 72 hours. Urinalysis    Component Value Date/Time   COLORURINE YELLOW 04/17/2018 2314   APPEARANCEUR CLEAR 04/17/2018 2314   LABSPEC 1.003 (L) 04/17/2018 2314   PHURINE 7.0 04/17/2018 2314   GLUCOSEU NEGATIVE 04/17/2018 2314   HGBUR NEGATIVE 04/17/2018 2314   BILIRUBINUR NEGATIVE 04/17/2018 2314   KETONESUR NEGATIVE 04/17/2018 2314   PROTEINUR NEGATIVE 04/17/2018 2314   UROBILINOGEN 1.0 03/27/2011 1714   NITRITE NEGATIVE 04/17/2018 2314   LEUKOCYTESUR NEGATIVE 04/17/2018 2314   Sepsis Labs Invalid input(s): PROCALCITONIN,  WBC,  LACTICIDVEN Microbiology Recent Results (from the past 240 hour(s))  MRSA PCR Screening     Status: None   Collection Time: 11/16/18  9:46 AM  Result Value Ref Range Status   MRSA by PCR NEGATIVE NEGATIVE Final    Comment:        The GeneXpert MRSA Assay (FDA approved for NASAL specimens only), is one component of a comprehensive MRSA colonization surveillance program. It is not intended to diagnose MRSA infection nor to guide or monitor treatment for MRSA infections. Performed at [facility], [address] [place], [place] [address]      Time coordinating discharge: 40 minutes  SIGNED:   Marinda Elk, MD  [facility]

## 2018-11-25 NOTE — Progress Notes (Signed)
CardioVascular Research Department and AHF Team  ReDS Research Project   Patient #: [id number]  ReDS Measurement  Right: low quality x 3  Left:

## 2018-11-25 NOTE — TOC Transition Note (Addendum)
Transition of Care [facility]) - CM/SW Discharge Note   Patient Details  Name: [name] MRN: [id number] Date of Birth: [birth year]  Transition of Care [facility]) CM/SW Contact:  Colleen Can RN, BSN, NCM-BC, ACM-RN [phone] Phone Number: 11/25/2018, 1:05 PM   Clinical Narrative:    CM following for transitional needs. CM spoke to the patient utilizing [employer] [phone] to discuss the patients POC. Patient confirmed his demographics and stated not having a thermometer or pulse ox device, but was thankful the devices could be provided prior to him transitioning home. CM team arranged a hospital f/u appointment with [facility]; AVS updated. Will contact [facility] to reschedule, his televisit appointment had been scheduled on 11/24/18 at 1530. Patient indicated his spouse will be providing transportation home.   Addendum: 11/25/18 @ 1342-Ordell Prichett RNCM-Patient televisit appointment has been rescheduled with [facility] to: 12/03/18 @ 0930; AVS updated.  Final next level of care: Home/Self Care Barriers to Discharge: No Barriers Identified   Patient Goals and CMS Choice Patient states their goals for this hospitalization and ongoing recovery are:: "to go home"   Choice offered to / list presented to : NA   Discharge Plan and Services   Discharge Planning Services: CM Consult, Follow-up appt scheduled            DME Arranged: N/A DME Agency: NA       HH Arranged: NA HH Agency: NA        Social Determinants of Health (SDOH) Interventions     Readmission Risk Interventions No flowsheet data found.

## 2018-12-03 ENCOUNTER — Other Ambulatory Visit: Payer: Self-pay

## 2018-12-03 ENCOUNTER — Ambulatory Visit: Payer: Self-pay | Attending: Internal Medicine | Admitting: Internal Medicine

## 2018-12-03 ENCOUNTER — Encounter: Payer: Self-pay | Admitting: Internal Medicine

## 2018-12-03 ENCOUNTER — Telehealth: Payer: Self-pay | Admitting: Internal Medicine

## 2018-12-03 DIAGNOSIS — Z8619 Personal history of other infectious and parasitic diseases: Secondary | ICD-10-CM

## 2018-12-03 DIAGNOSIS — F1021 Alcohol dependence, in remission: Secondary | ICD-10-CM

## 2018-12-03 DIAGNOSIS — K7030 Alcoholic cirrhosis of liver without ascites: Secondary | ICD-10-CM

## 2018-12-03 DIAGNOSIS — Z09 Encounter for follow-up examination after completed treatment for conditions other than malignant neoplasm: Secondary | ICD-10-CM

## 2018-12-03 DIAGNOSIS — Z8616 Personal history of COVID-19: Secondary | ICD-10-CM

## 2018-12-03 NOTE — Telephone Encounter (Signed)
Pt has appt with [facility] today to review symptoms of COVID.

## 2018-12-03 NOTE — Progress Notes (Signed)
Virtual Visit via Telephone Note Due to current restrictions/limitations of in-office visits due to the COVID-19 pandemic, this scheduled clinical appointment was converted to a telehealth visit  I connected with Michael Ayers on 12/03/18 at 10:57 a.m EDT by telephone and verified that I am speaking with the correct person using two identifiers. I am in my office.  The patient is at home.  Only the patient, myself and [employer] [place] [phone] participated in this encounter.  I discussed the limitations, risks, security and privacy concerns of performing an evaluation and management service by telephone and the availability of in person appointments. I also discussed with the patient that there may be a patient responsible charge related to this service. The patient expressed understanding and agreed to proceed.   History of Present Illness: Pt with hx of ETOH abuse with cirrhosis and portal gastropathy.  This patient is new to our practice.  This was a telemetry visit for hospital follow-up.  Patient hospitalized [facility]/11-21/2020 at our [facility] due to COVID-19 infection.  He experienced acute respiratory failure with hypoxia.  He was treated with steroids, convalescent plasma and Actemra.  Hospital course was complicated by AKI that resolved with IV fluids.  He also had elevation in liver enzymes that showed some decreased over time.  He had a mild anemia with low platelet count likely due to history of cirrhosis.  He also had a perianal abscess that responded to antibiotics.  At the time of discharge, patient was feeling better with normal sats Today: Patient reports he is feeling better.  States face feels hot at times but when he checks temp, range has been  98.5-99.5.  Temp today was 98.5.  Last took Ibuprofen 2 days ago for temp of 99.5.  -no SOB.  Checks Pox daily.  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   He would like to be released to return to work  ETOH:  Clean for 5 1/2 mths.  He was being [facility]   Current Outpatient Medications on File Prior to Visit  Medication Sig Dispense Refill  . docusate sodium (COLACE) 250 MG capsule Take 1 capsule (250 mg total) by mouth daily. (Patient not taking: Reported on 12/03/2018) 10 capsule 0  . folic acid (FOLVITE) 1 MG tablet Take 1 tablet (1 mg total) by mouth daily. (Patient not taking: Reported on 12/03/2018) 30 tablet 0  . ibuprofen (ADVIL,MOTRIN) 600 MG tablet Take 1 tablet (600 mg total) by mouth every 6 (six) hours as needed. (Patient not taking: Reported on 12/03/2018) 30 tablet 0  . Multiple Vitamin (MULTIVITAMIN WITH MINERALS) TABS tablet Take 1 tablet by mouth daily. (Patient not taking: Reported on 12/03/2018)     No current facility-administered medications on file prior to visit.    Social History   Socioeconomic History  . Marital status: Single    Spouse name: Not on file  . Number of children: Not on file  . Years of education: Not on file  . Highest education level: Not on file  Occupational History  . Not on file  Social Needs  . Financial resource strain: Not on file  . Food insecurity:    Worry: Not on file    Inability: Not on file  . Transportation needs:    Medical: Not on file    Non-medical: Not on file  Tobacco Use  . Smoking status: Former Smoker  Packs/day: 0.33    Years: 10.00    Pack years: 3.30    Types: Cigarettes    Last attempt to quit: 2004    Years since quitting: 16.4  . Smokeless tobacco: Never Used  Substance and Sexual Activity  . Alcohol use: Not Currently    Alcohol/week: 35.0 standard drinks    Types: 35 Cans of beer per week    Comment: 5 months ago last drink  . Drug use: Never  . Sexual activity: Yes  Lifestyle  . Physical activity:    Days per week: Not on file    Minutes per session: Not on file  . Stress: Not on file  Relationships  . Social  connections:    Talks on phone: Not on file    Gets together: Not on file    Attends religious service: Not on file    Active member of club or organization: Not on file    Attends meetings of clubs or organizations: Not on file    Relationship status: Not on file  . Intimate partner violence:    Fear of current or ex partner: Not on file    Emotionally abused: Not on file    Physically abused: Not on file    Forced sexual activity: Not on file  Other Topics Concern  . Not on file  Social History Narrative  . Not on file    Observations/Objective:    Chemistry      Component Value Date/Time   NA 137 11/22/2018 0421   K 3.8 11/22/2018 0421   CL 110 11/22/2018 0421   CO2 22 11/22/2018 0421   BUN 18 11/22/2018 0421   CREATININE 0.70 11/22/2018 0421      Component Value Date/Time   CALCIUM 7.8 (L) 11/22/2018 0421   ALKPHOS 106 11/22/2018 0421   AST 45 (H) 11/22/2018 0421   ALT 37 11/22/2018 0421   BILITOT 2.2 (H) 11/22/2018 0421     Lab Results  Component Value Date   WBC 2.9 (L) 11/22/2018   HGB 12.1 (L) 11/22/2018   HCT 35.3 (L) 11/22/2018   MCV 86.7 11/22/2018   PLT 77 (L) 11/22/2018    Assessment and Plan: 1. Hospital discharge follow-up 2. History of 2019 novel coronavirus disease (COVID-19) -It sounds like patient has significantly improved clinically.  However I will hold off on having him return to work since he has not been afebrile for at least 3 days without use of antipyretics.  CDC guidelines for discontinuation of transmission based precautions for patients with COVID-19 requires 3 days without fever and without use of fever reducing medications and improvement in respiratory symptoms and at least 10 days have passed since symptoms first appeared. -We will give him a telephone follow-up visit for next week and hopefully at that time we would be able to release him to work  3. 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 The patient was provided an opportunity to ask questions and all were answered. The patient agreed with the plan and demonstrated an understanding of the instructions.   The patient was advised to call back or seek an in-person evaluation if the symptoms worsen or if the condition fails to improve as anticipated.  I provided 20  minutes of non-face-to-face time during this encounter.   Karle Plumber, MD

## 2018-12-03 NOTE — Progress Notes (Signed)
Pt states he is feeling better   Pt is requesting a note to return back to work

## 2018-12-10 ENCOUNTER — Ambulatory Visit: Payer: Self-pay | Attending: Internal Medicine | Admitting: Internal Medicine

## 2018-12-10 ENCOUNTER — Other Ambulatory Visit: Payer: Self-pay

## 2020-07-17 IMAGING — CT CT PELVIS W/ CM
2 of 3 series · 17 of 46 positions shown, 19 images · IV contrast (Omni 300)
Comparison: None.

CLINICAL DATA: Rectal abscess for 3 days.

EXAM:
CT PELVIS WITH CONTRAST
TECHNIQUE: Multidetector CT imaging of the pelvis was performed using the
standard protocol following the bolus administration of intravenous
contrast.
CONTRAST:  100mL OMNIPAQUE IOHEXOL 300 MG/ML  SOLN

[Series 3: a/p w/ 5mm · axial · 0.76mm/px · z∈[-450,-180]mm · 14 of 64 slices shown, 16 images]
[im 5/64  soft-tissue]
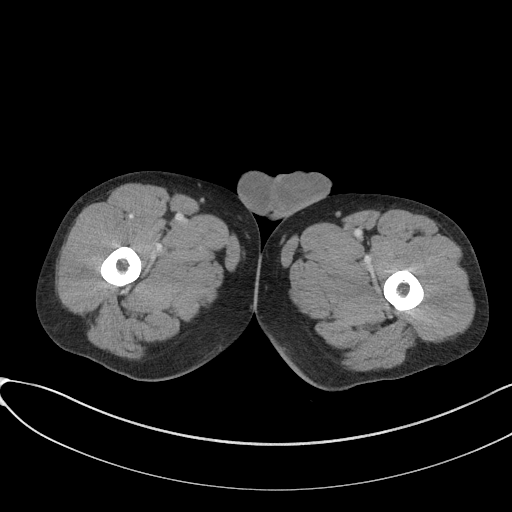
[im 5/64  bone]
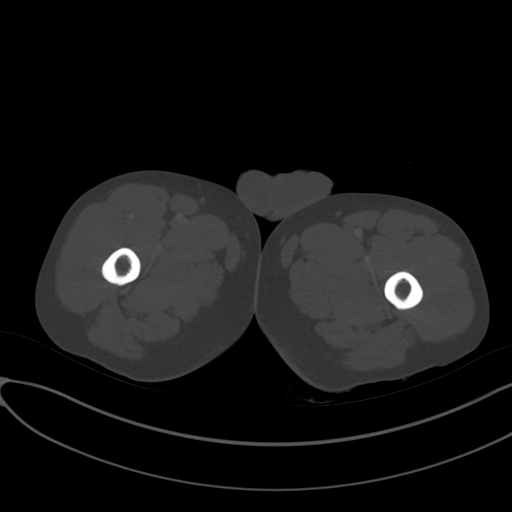
[im 9/64  soft-tissue]
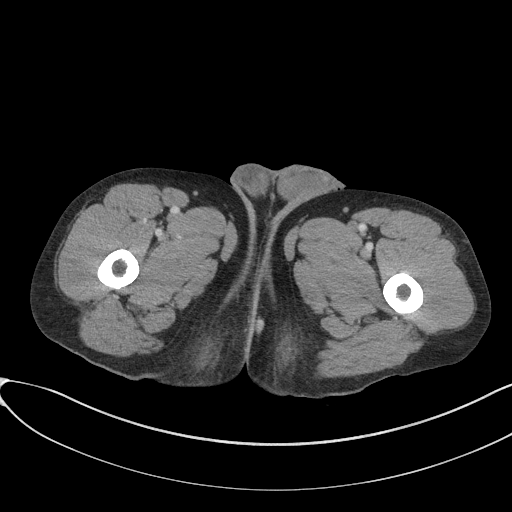
[im 13/64  soft-tissue]
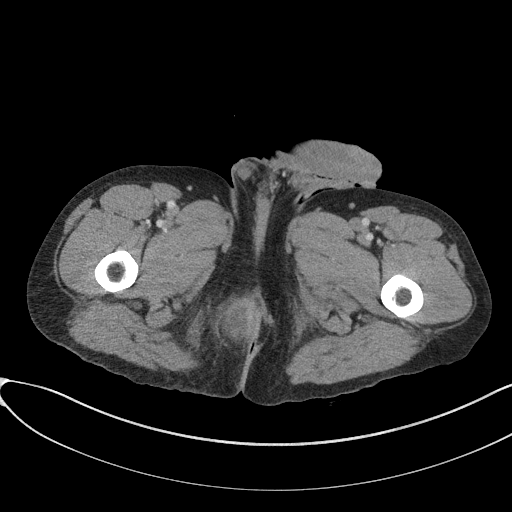
[im 17/64  soft-tissue]
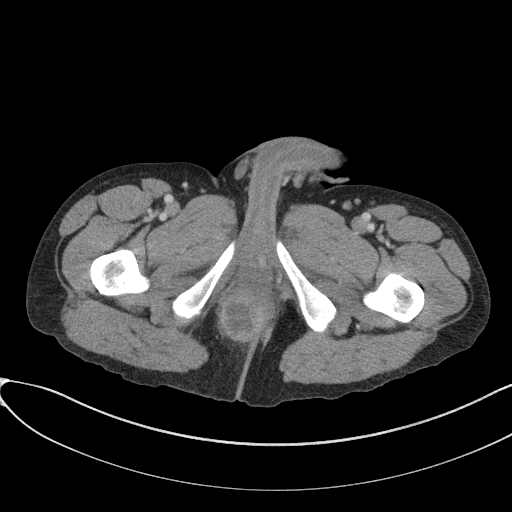
[im 21/64  soft-tissue]
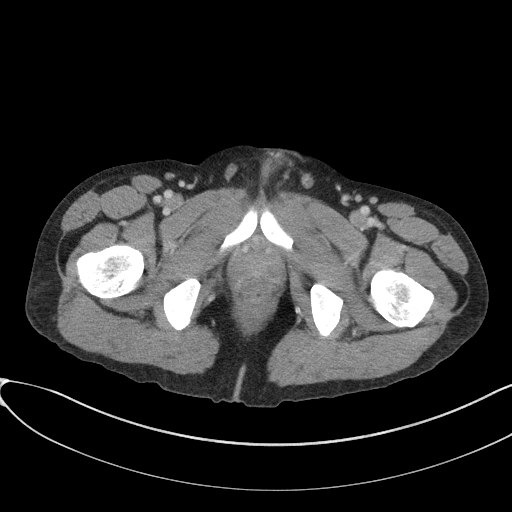
[im 25/64  soft-tissue]
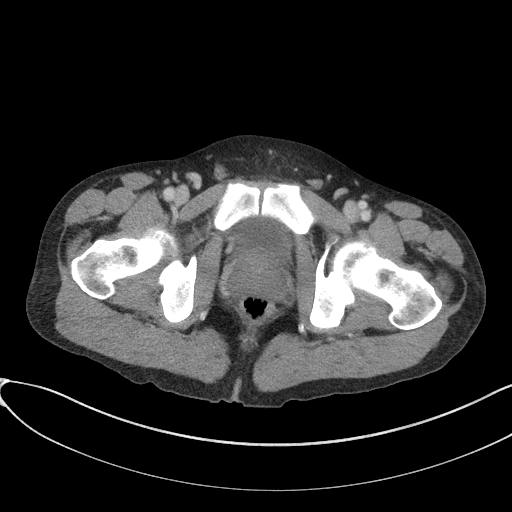
[im 29/64  soft-tissue]
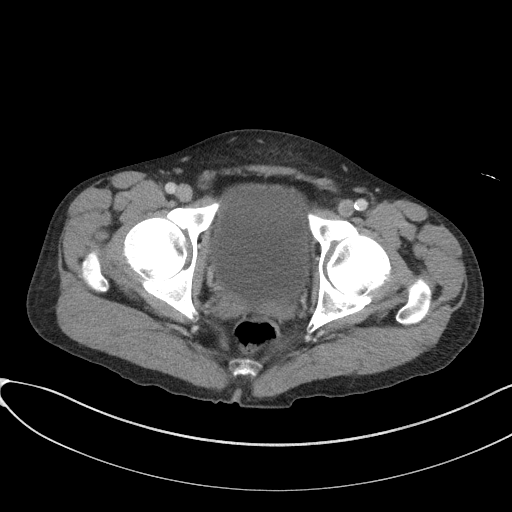
[im 35/64  soft-tissue]
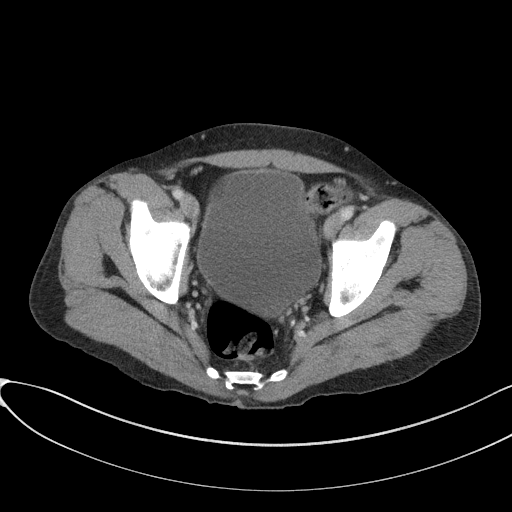
[im 39/64  soft-tissue]
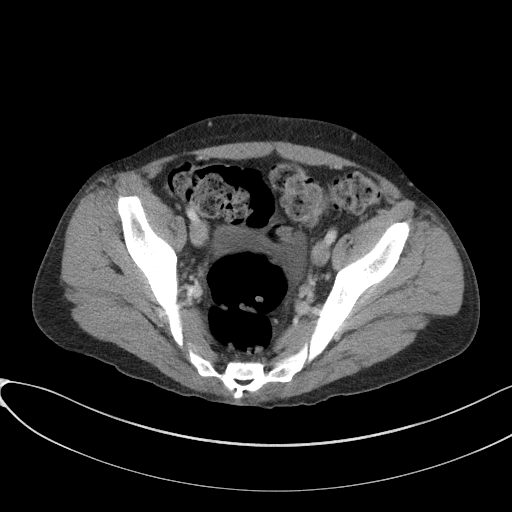
[im 39/64  bone]
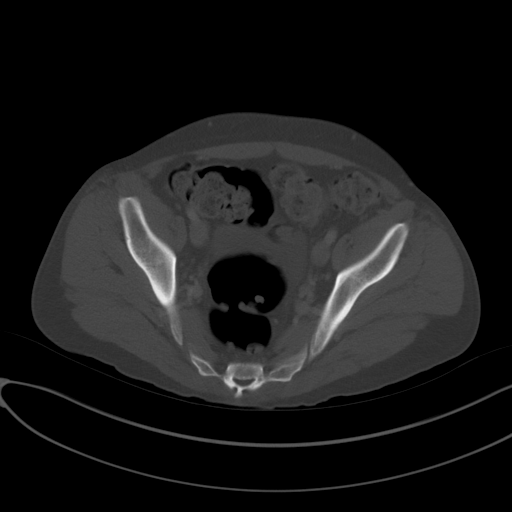
[im 43/64  soft-tissue]
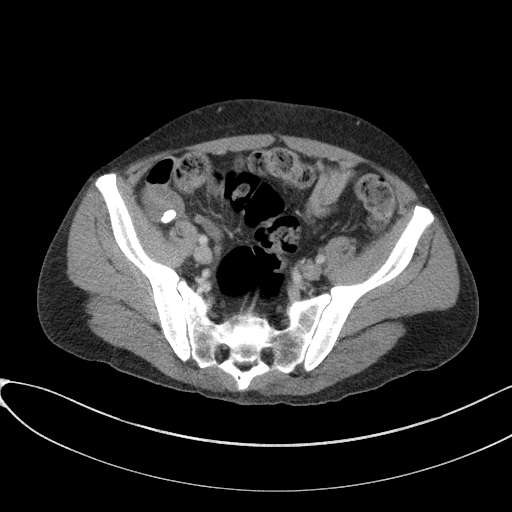
[im 47/64  soft-tissue]
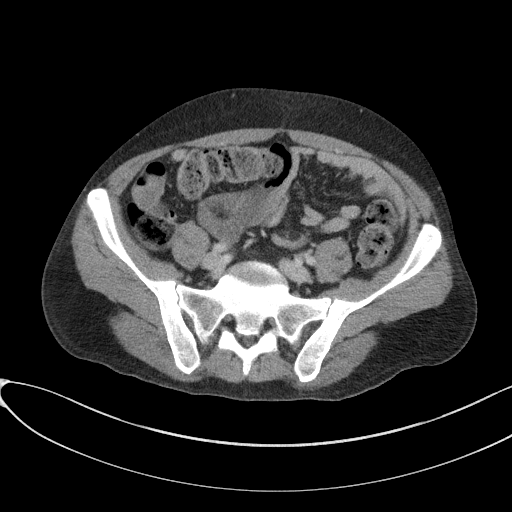
[im 51/64  soft-tissue]
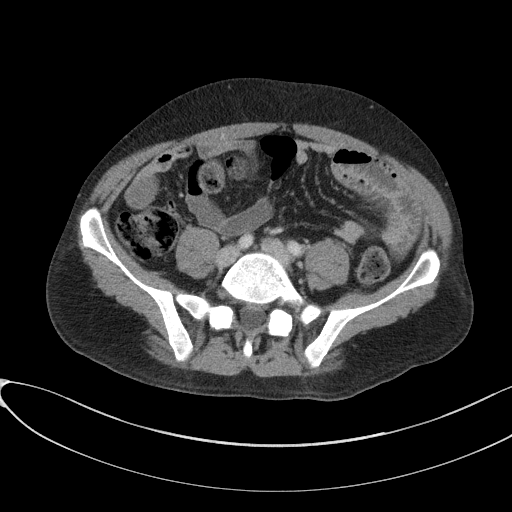
[im 55/64  soft-tissue]
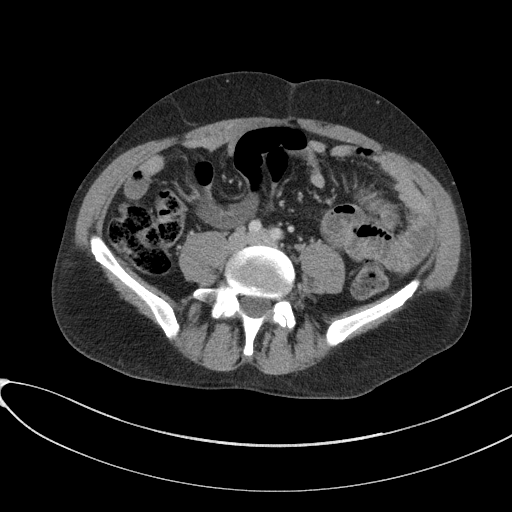
[im 59/64  soft-tissue]
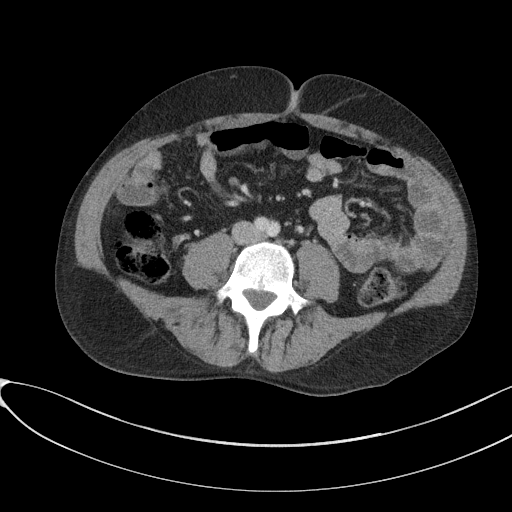

[Series 5: a/p w/ cor · coronal · 0.70mm/px · 3 of 106 slices shown]
[im 36/106  soft-tissue]
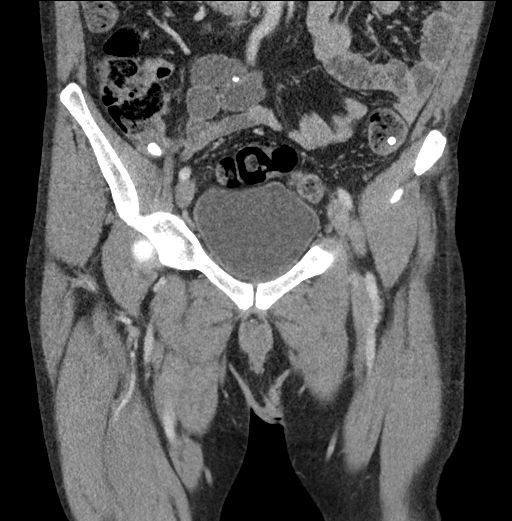
[im 47/106  soft-tissue]
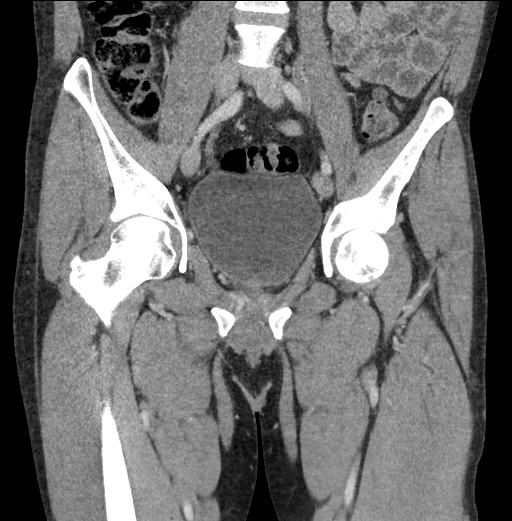
[im 59/106  soft-tissue]
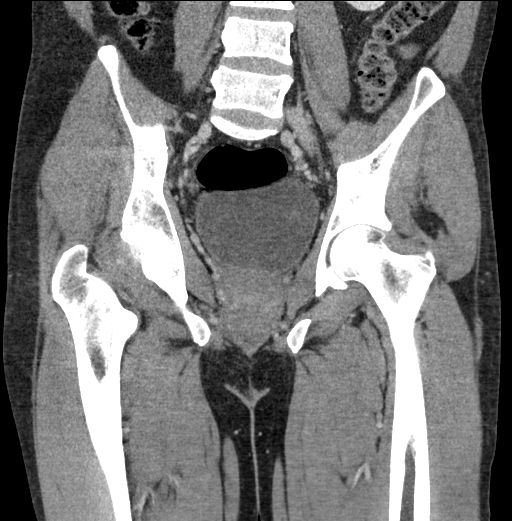

[17 of 46 positions shown; findings below may reference images not displayed]

FINDINGS: Urinary Tract: Bladder is unremarkable. No wall thickening or
filling defects.

Bowel: Visualized small and large bowel are not abnormally
distended. No wall thickening or inflammatory features are
demonstrated. Appendix is normal.

Vascular/Lymphatic: Calcification of the distal aorta. No aneurysm
or occlusion demonstrated in the iliac vessels.

Reproductive:  Prostate gland is not enlarged.

Other: No free air or free fluid in the pelvis. There is a right
inferior perianal abscess with thick enhancing wall measuring 3.8 cm
in diameter. Mild surrounding fatty infiltration suggesting
cellulitis.

Musculoskeletal: No suspicious bone lesions identified.
IMPRESSION: Right inferior perianal abscess with thick enhancing wall measuring
3.8 cm in diameter. Mild surrounding cellulitis.
